# Patient Record
Sex: Female | Born: 1970
Health system: Southern US, Community
[De-identification: ages and names within clinical notes are randomized; demographics above are authoritative.]

## PROBLEM LIST (undated history)

## (undated) DIAGNOSIS — R51 Headache: Secondary | ICD-10-CM

## (undated) DIAGNOSIS — K589 Irritable bowel syndrome without diarrhea: Secondary | ICD-10-CM

## (undated) DIAGNOSIS — K605 Anorectal fistula, unspecified: Secondary | ICD-10-CM

## (undated) DIAGNOSIS — Z8679 Personal history of other diseases of the circulatory system: Secondary | ICD-10-CM

## (undated) DIAGNOSIS — Z9889 Other specified postprocedural states: Secondary | ICD-10-CM

## (undated) DIAGNOSIS — G8929 Other chronic pain: Secondary | ICD-10-CM

## (undated) DIAGNOSIS — R519 Headache, unspecified: Secondary | ICD-10-CM

## (undated) DIAGNOSIS — Z973 Presence of spectacles and contact lenses: Secondary | ICD-10-CM

## (undated) DIAGNOSIS — M1990 Unspecified osteoarthritis, unspecified site: Secondary | ICD-10-CM

## (undated) DIAGNOSIS — F419 Anxiety disorder, unspecified: Secondary | ICD-10-CM

## (undated) DIAGNOSIS — R112 Nausea with vomiting, unspecified: Secondary | ICD-10-CM

## (undated) DIAGNOSIS — I10 Essential (primary) hypertension: Secondary | ICD-10-CM

## (undated) HISTORY — DX: Unspecified osteoarthritis, unspecified site: M19.90

## (undated) HISTORY — DX: Irritable bowel syndrome, unspecified: K58.9

## (undated) HISTORY — DX: Headache, unspecified: R51.9

## (undated) HISTORY — DX: Headache: R51

## (undated) HISTORY — PX: REDUCTION MAMMAPLASTY: SUR839

## (undated) HISTORY — DX: Other chronic pain: G89.29

---

## 1994-01-06 HISTORY — PX: BREAST REDUCTION SURGERY: SHX8

## 2005-08-08 ENCOUNTER — Ambulatory Visit: Payer: Self-pay | Admitting: Family Medicine

## 2005-08-22 ENCOUNTER — Ambulatory Visit: Payer: Self-pay | Admitting: Family Medicine

## 2005-12-19 ENCOUNTER — Ambulatory Visit: Payer: Self-pay | Admitting: Family Medicine

## 2006-05-13 ENCOUNTER — Ambulatory Visit: Payer: Self-pay | Admitting: Obstetrics & Gynecology

## 2006-05-13 ENCOUNTER — Encounter: Payer: Self-pay | Admitting: Obstetrics & Gynecology

## 2007-04-27 ENCOUNTER — Ambulatory Visit: Payer: Self-pay | Admitting: Obstetrics & Gynecology

## 2007-04-29 ENCOUNTER — Ambulatory Visit (HOSPITAL_COMMUNITY): Admission: RE | Admit: 2007-04-29 | Discharge: 2007-04-29 | Payer: Self-pay | Admitting: Gynecology

## 2007-05-06 ENCOUNTER — Ambulatory Visit (HOSPITAL_COMMUNITY): Admission: RE | Admit: 2007-05-06 | Discharge: 2007-05-06 | Payer: Self-pay | Admitting: Gynecology

## 2007-08-27 ENCOUNTER — Ambulatory Visit: Payer: Self-pay | Admitting: Internal Medicine

## 2007-08-27 DIAGNOSIS — K589 Irritable bowel syndrome without diarrhea: Secondary | ICD-10-CM | POA: Insufficient documentation

## 2007-08-27 DIAGNOSIS — R1011 Right upper quadrant pain: Secondary | ICD-10-CM | POA: Insufficient documentation

## 2007-08-31 LAB — CONVERTED CEMR LAB: Tissue Transglutaminase Ab, IgA: 1 units (ref <7)

## 2007-11-02 ENCOUNTER — Ambulatory Visit: Payer: Self-pay | Admitting: Nurse Practitioner

## 2007-11-23 ENCOUNTER — Encounter: Payer: Self-pay | Admitting: Family Medicine

## 2007-11-30 ENCOUNTER — Ambulatory Visit: Payer: Self-pay | Admitting: Nurse Practitioner

## 2008-01-11 ENCOUNTER — Ambulatory Visit: Payer: Self-pay | Admitting: Family Medicine

## 2008-01-11 DIAGNOSIS — M25539 Pain in unspecified wrist: Secondary | ICD-10-CM | POA: Insufficient documentation

## 2008-01-26 ENCOUNTER — Ambulatory Visit: Payer: Self-pay | Admitting: Family Medicine

## 2008-01-26 DIAGNOSIS — M654 Radial styloid tenosynovitis [de Quervain]: Secondary | ICD-10-CM | POA: Insufficient documentation

## 2008-02-01 ENCOUNTER — Ambulatory Visit: Payer: Self-pay | Admitting: Nurse Practitioner

## 2008-02-08 ENCOUNTER — Telehealth (INDEPENDENT_AMBULATORY_CARE_PROVIDER_SITE_OTHER): Payer: Self-pay | Admitting: *Deleted

## 2008-02-08 ENCOUNTER — Encounter: Payer: Self-pay | Admitting: Family Medicine

## 2008-03-06 ENCOUNTER — Telehealth: Payer: Self-pay | Admitting: Family Medicine

## 2008-05-30 ENCOUNTER — Encounter: Payer: Self-pay | Admitting: Nurse Practitioner

## 2008-05-30 ENCOUNTER — Ambulatory Visit: Payer: Self-pay | Admitting: Nurse Practitioner

## 2008-10-24 ENCOUNTER — Ambulatory Visit: Payer: Self-pay | Admitting: Nurse Practitioner

## 2009-01-11 ENCOUNTER — Ambulatory Visit: Payer: Self-pay | Admitting: Family Medicine

## 2009-01-11 DIAGNOSIS — R079 Chest pain, unspecified: Secondary | ICD-10-CM | POA: Insufficient documentation

## 2009-01-11 DIAGNOSIS — I10 Essential (primary) hypertension: Secondary | ICD-10-CM | POA: Insufficient documentation

## 2009-01-12 LAB — CONVERTED CEMR LAB
AST: 21 units/L (ref 0–37)
Albumin: 3.5 g/dL (ref 3.5–5.2)
Alkaline Phosphatase: 53 units/L (ref 39–117)
Basophils Absolute: 0 10*3/uL (ref 0.0–0.1)
Basophils Relative: 0.1 % (ref 0.0–3.0)
CO2: 28 meq/L (ref 19–32)
Calcium: 9.2 mg/dL (ref 8.4–10.5)
Eosinophils Absolute: 0 10*3/uL (ref 0.0–0.7)
Glucose, Bld: 86 mg/dL (ref 70–99)
HCT: 44 % (ref 36.0–46.0)
Hemoglobin: 14.3 g/dL (ref 12.0–15.0)
Lymphocytes Relative: 36.2 % (ref 12.0–46.0)
Lymphs Abs: 2.4 10*3/uL (ref 0.7–4.0)
MCHC: 32.4 g/dL (ref 30.0–36.0)
Monocytes Relative: 6.1 % (ref 3.0–12.0)
Neutro Abs: 3.9 10*3/uL (ref 1.4–7.7)
Potassium: 3.6 meq/L (ref 3.5–5.1)
RBC: 4.42 M/uL (ref 3.87–5.11)
RDW: 12.3 % (ref 11.5–14.6)
Sodium: 139 meq/L (ref 135–145)
TSH: 1.1 microintl units/mL (ref 0.35–5.50)
Total Protein: 8 g/dL (ref 6.0–8.3)

## 2009-01-23 ENCOUNTER — Ambulatory Visit: Payer: Self-pay | Admitting: Family Medicine

## 2009-11-20 ENCOUNTER — Ambulatory Visit: Payer: Self-pay | Admitting: Nurse Practitioner

## 2009-11-26 ENCOUNTER — Ambulatory Visit: Payer: Self-pay | Admitting: Internal Medicine

## 2009-11-26 DIAGNOSIS — M79609 Pain in unspecified limb: Secondary | ICD-10-CM | POA: Insufficient documentation

## 2009-12-03 ENCOUNTER — Ambulatory Visit: Payer: Self-pay | Admitting: Internal Medicine

## 2010-01-27 ENCOUNTER — Encounter: Payer: Self-pay | Admitting: Gynecology

## 2010-02-05 NOTE — Letter (Signed)
Summary: Chronic Disease Risk Summary Report/Know Your Number  Chronic Disease Risk Summary Report/Know Your Number   Imported By: Lanelle Bal 01/30/2009 12:57:33  _____________________________________________________________________  External Attachment:    Type:   Image     Comment:   External Document

## 2010-02-05 NOTE — Assessment & Plan Note (Signed)
Summary: LEFT HAND PAIN/CLE   Vital Signs:  Patient profile:   40 year old female Height:      60 inches Weight:      197.25 pounds BMI:     38.66 Temp:     98.9 degrees F oral Pulse rate:   76 / minute Pulse rhythm:   regular BP sitting:   112 / 80  (right arm) Cuff size:   large  Vitals Entered By: Lewanda Rife LPN (November 26, 2009 12:17 PM) CC: Lt hand pain On 11/25/09 lt hand was shut in car door.   History of Present Illness: CC: L hand injury  DOI: 11/25/2009  L hand crush injury - closed door on hand yesterday afternoon.  Immediately started using ice and ibuprofen.  This am hurting forearm as well.  Mainly 2nd and 3rd MC heads.  Mild swelling, mild bruising.  No bleeding.  No previous L hand injuries before.  Caught R thumb in fireplace last year.  Allergies: 1)  ! 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  Alcohol Use - yes- rare Daily Caffeine Use-1 Illicit Drug Use - no Patient does not get regular exercise.  Married with one son  Review of Systems       per HPI  Physical Exam  General:  overweight appearing female in NAD Msk:  L dorsal hand mild swelling, point tenderness along head of 2nd MCP joint.  No anatomical snuff box tenderness.    able to flex PIP/DIP of 2nd and 3rd fingers in isolation against resistance. Pulses:  2+ rad pulses Extremities:  No clubbing, cyanosis, edema or deformities noted. Neurologic:  sensation intact.  diminished grip strength L hand 2/2 pain.  unable to touch thumb/2nd finger to test strength. Skin:  slight bruising over 2nd and 3rd MCP joints   Impression & Recommendations:  Problem # 1:  HAND PAIN, LEFT (ICD-729.5) Xrays L hand - negative for fracture on my read.  likely bony contusion vs ligament sprain.  rec RTC 1 wk for  f/u with Copland or myself.  no tendon injury evident on exam today.  advised could use hand splint in neutral position, provided with script for same.  Orders: T-Hand Left 3 Views (73130TC)  Complete Medication List: 1)  Multivitamins Tabs (Multiple vitamin) .... Once daily 2)  Metoprolol Succinate 25 Mg Xr24h-tab (Metoprolol succinate) .Marland Kitchen.. 1 tab by mouth daily 3)  Topamax 50 Mg Tabs (Topiramate) .... Take one and a half at bedtime to total 75 mg 4)  Treximet 85-500 Mg Tabs (Sumatriptan-naproxen sodium) .... Take one tablet as needed migraine 5)  Losartan Potassium-hctz 100-25 Mg Tabs (Losartan potassium-hctz) .Marland Kitchen.. 1 tab by mouth daily 6)  Lo Loestrin ?mg  .... One tablet by mouth daily 7)  Effexor Xr 75 Mg Xr24h-cap (Venlafaxine hcl) .... Take 1 capsule by mouth once a day 8)  L Hand Brace/splint  .... To keep 2nd/3rd mcp joint  ~70-90 degrees (neutral) icd:729.5  Patient Instructions: 1)  Return in 1 wk for follow up. 2)  Xrays look good today.  I think you have bony contusion. 3)  Use ice and ibuprofen (regularly for next few days then as needed). 4)  Good to meet you today, call clinic with questions. 5)  If not getting better as expected, call us. Prescriptions: L HAND BRACE/SPLINT to keep 2nd/3rd MCP joint  ~  70-90 degrees (neutral) ICD:729.5  #1 x 0   Entered and Authorized by:   Eustaquio Boyden  MD   Signed by:   Eustaquio Boyden  MD on 11/26/2009   Method used:   Print then Give to Patient   RxID:   [id number]    Orders Added: 1)  T-Hand Left 3 Views [73130TC] 2)  Est. Patient Level III [14782]    Current Allergies (reviewed today): ! PERCOCET  Appended Document: LEFT HAND PAIN/CLE FROM at wrists bilaterally passively and against resistance

## 2010-02-05 NOTE — Assessment & Plan Note (Signed)
Summary: ONE WEEK FOLLOW UP / LFW   Vital Signs:  Patient profile:   40 year old female Height:      60 inches Weight:      199.75 pounds BMI:     39.15 Temp:     98.6 degrees F oral Pulse rate:   74 / minute Pulse rhythm:   regular BP sitting:   110 / 72  (left arm) Cuff size:   large  Vitals Entered ByMelody Comas (December 03, 2009 1:59 PM)  History of Present Illness: CC: f/u hand  DOI: 11/25/2009  L hand crush injury - closed door on hand.  Immediately started using ice and ibuprofen.  Mainly 2nd and 3rd MC heads.  Mild swelling, mild bruising.  No bleeding.  No previous L hand injuries before.    Xrays last week WNL, no fracture.  advised to rest, compression ace wrap during work, ice at home.  doing better, regaining function, able to grasp things at work.    Allergies: 1)  ! Percocet  Review of Systems       per HPI  Physical Exam  General:  overweight appearing female in NAD Msk:  L dorsal hand mild swelling, some tenderness along head of 2nd MCP joint.   No anatomical snuff box tenderness.    able to flex PIP of 2nd and 3rd fingers in isolation against resistance. Pulses:  2+ rad pulses, brisk cap refill Neurologic:  sensation intact.  improved grip strength L hand.  able to touch thumb/2nd finger to test strength, good strength   Impression & Recommendations:  Problem # 1:  HAND PAIN, LEFT (ICD-729.5) bony contusion vs ligament sprain.  continue NSAIDs as needed, no script provided.  return if worsening, o/w should resolve with time.  Complete Medication List: 1)  Multivitamins Tabs (Multiple vitamin) .... Once daily 2)  Metoprolol Succinate 25 Mg Xr24h-tab (Metoprolol succinate) .Marland Kitchen.. 1 tab by mouth daily 3)  Topamax 50 Mg Tabs (Topiramate) .... Take one and a half at bedtime to total 75 mg 4)  Treximet 85-500 Mg Tabs (Sumatriptan-naproxen sodium) .... Take one tablet as needed migraine 5)  Losartan Potassium-hctz 100-25 Mg Tabs (Losartan  potassium-hctz) .Marland Kitchen.. 1 tab by mouth daily 6)  Lo Loestrin ?mg  .... One tablet by mouth daily 7)  Effexor Xr 75 Mg Xr24h-cap (Venlafaxine hcl) .... Take 1 capsule by mouth once a day 8)  L Hand Brace/splint  .... To keep 2nd/3rd mcp joint  ~70-90 degrees (neutral) icd:729.5  Patient Instructions: 1)  Good to see you today. 2)  Continue anti inflammatory as well as voltaren gel. 3)  Keep wrapping for next few weeks to help. 4)  Return if not improving as expected. 5)  Call clinic with uqestions.   Orders Added: 1)  Est. Patient Level II [[address]]    Current Allergies (reviewed today): ! PERCOCET

## 2010-02-05 NOTE — Assessment & Plan Note (Signed)
Summary: ROA   Vital Signs:  Patient profile:   40 year old female Height:      60 inches Weight:      188.2 pounds BMI:     36.89 Temp:     98.1 degrees F oral Pulse rate:   78 / minute Pulse rhythm:   regular BP sitting:   124 / 78  (left arm) Cuff size:   large  Vitals Entered By: Benny Lennert CMA Duncan Dull) (January 23, 2009 12:36 PM)  History of Present Illness: Chief complaint 2 wk follow up bp  Chest pain resolved...more energy on lower dose of metoprolol.   Hypertension History:      She denies chest pain, palpitations, dyspnea with exertion, syncope, and side effects from treatment.  BP well controlled at home...higher at night 140/100 Has noted side effect of dry cough.Kimberly Kitchenocc keeps up at night Feeling better. Kimberly [name]        Positive major cardiovascular risk factors include hypertension.  Negative major cardiovascular risk factors include female age less than 72 years old and non-tobacco-user status.     Problems Prior to Update: 1)  Chest Pain, Acute  (ICD-786.50) 2)  Essential Hypertension  (ICD-401.9) 3)  De Quervain's Tenosynovitis  (ICD-727.04) 4)  Wrist Pain, Right  (ICD-719.43) 5)  Irritable Bowel Syndrome  (ICD-564.1) 6)  Abdominal Pain-ruq  (ICD-789.01)  Current Medications (verified): 1)  Multivitamins  Tabs (Multiple Vitamin) .... Once Daily 2)  Metoprolol Succinate 25 Mg Xr24h-Tab (Metoprolol Succinate) .Kimberly [name].. 1 Tab By Mouth Daily 3)  Topamax 50 Mg Tabs (Topiramate) .... Take One and A Half At Bedtime To Total 75 Mg 4)  Treximet 85-500 Mg Tabs (Sumatriptan-Naproxen Sodium) .... Take One Tablet As Needed Migraine 5)  Losartan Potassium-Hctz 100-25 Mg Tabs (Losartan Potassium-Hctz) .Kimberly [name].. 1 Tab By Mouth Daily 6)  Nuvaring 0.12-0.015 Mg/24hr Ring (Etonogestrel-Ethinyl Estradiol) .Kimberly [name].. 1 Inserted Every 3 Weeks As Directed  Allergies: 1)  ! Percocet  Past History:  Past medical, surgical, family and social histories (including risk factors) reviewed, and no  changes noted (except as noted below).  Past Medical History: Reviewed history from 08/27/2007 and no changes required. Chronic Headaches Hypertension Urinary Tract Infection  Past Surgical History: Reviewed history from 08/27/2007 and no changes required. C- Section 2001 Breast reduction  Family History: Reviewed history from 08/27/2007 and no changes required. Family History of Ovarian Cancer:paternal Grandmother Family History of Colitis/Crohn's:Father Family History of Diabetes: Mother, Father, Sister Family History of Irritable Bowel Syndrome:Brother, Sister No FH of Colon Cancer:  Social History: Reviewed history from 08/27/2007 and no changes required. 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  Alcohol Use - yes- rare Daily Caffeine Use-1 Illicit Drug Use - no Patient does not get regular exercise.  Married with one son  Review of Systems General:  Denies fatigue and fever. CV:  Denies chest pain or discomfort. Resp:  Denies shortness of breath. GI:  Denies abdominal pain. GU:  Denies dysuria.  Physical Exam  General:  overweight appearing female in NAD Mouth:  MMM Neck:  no carotid bruit or thyromegaly no cervical or supraclavicular lymphadenopathy  Lungs:  Normal respiratory effort, chest expands symmetrically. Lungs are clear to auscultation, no crackles or wheezes. Heart:  Normal rate and regular rhythm. S1 and S2 normal without gallop, murmur, click, rub or other extra sounds. Pulses:  R and L posterior tibial pulses are full and equal bilaterally  Extremities:  No clubbing, cyanosis, edema or deformities noted.   Impression & Recommendations:  Problem # 1:  ESSENTIAL HYPERTENSION (ICD-401.9) Improved control on ARB but not at goal at home..increase dose.  Energy better on lower dose of BBlocker. Her updated medication list for this problem includes:    Metoprolol Succinate 25 Mg Xr24h-tab (Metoprolol succinate) .Kimberly [name]... 1 tab by mouth  daily    Losartan Potassium-hctz 100-25 Mg Tabs (Losartan potassium-hctz) .Kimberly [name]... 1 tab by mouth daily  BP today: 124/78 Prior BP: 120/86 (01/11/2009)  Prior 10 Yr Risk Heart Disease: Not enough information (01/11/2009)  Labs Reviewed: K+: 3.6 (01/11/2009) Creat: : 0.7 (01/11/2009)     Problem # 2:  CHEST PAIN, ACUTE (ICD-786.50) Assessment: Improved Resolved.  Complete Medication List: 1)  Multivitamins Tabs (Multiple vitamin) .... Once daily 2)  Metoprolol Succinate 25 Mg Xr24h-tab (Metoprolol succinate) .Kimberly [name].. 1 tab by mouth daily 3)  Topamax 50 Mg Tabs (Topiramate) .... Take one and a half at bedtime to total 75 mg 4)  Treximet 85-500 Mg Tabs (Sumatriptan-naproxen sodium) .... Take one tablet as needed migraine 5)  Losartan Potassium-hctz 100-25 Mg Tabs (Losartan potassium-hctz) .Kimberly [name].. 1 tab by mouth daily 6)  Nuvaring 0.12-0.015 Mg/24hr Ring (Etonogestrel-ethinyl estradiol) .Kimberly [name].. 1 inserted every 3 weeks as directed  Hypertension Assessment/Plan:      The patient's hypertensive risk group is category A: No risk factors and no target organ damage.  Today's blood pressure is 124/78.  Her blood pressure goal is < 140/90.  Patient Instructions: 1)  Please schedule a follow-up appointment in 1 month, call if BP still over 140/90 on new medicaiton. Prescriptions: LOSARTAN POTASSIUM-HCTZ 100-25 MG TABS (LOSARTAN POTASSIUM-HCTZ) 1 tab by mouth daily  #30 x 0   Entered and Authorized by:   Kerby Nora MD   Signed by:   Kerby Nora MD on 01/23/2009   Method used:   Electronically to        [facility]* (retail)       [address]       [address] Shipping/mailing       [place], [place]  [address]       Ph: [phone]       Fax: [phone]   RxID:   [id number]   Current Allergies (reviewed today): ! PERCOCET

## 2010-02-05 NOTE — Assessment & Plan Note (Signed)
Summary: F/U BLOOD PRESSURE/CLE   Vital Signs:  Patient profile:   40 year old female Height:      60 inches Weight:      186.2 pounds BMI:     36.50 Temp:     98.1 degrees F oral Pulse rate:   78 / minute Pulse rhythm:   regular BP sitting:   120 / 86  (left arm) Cuff size:   large  Vitals Entered By: Benny Lennert CMA Duncan Dull) (January 11, 2009 11:57 AM)  History of Present Illness: Chief complaint followup blood pressure  HTN, had previously been well controlled on HCTZhydralazine and metoprolol.  Has noted elevated BPs in last 4-6 monhs Noted elevated in headhace study. 120/90s  In last month  increased metoprol to two times a day..no BP improvement but feels more tired.   Few episodes of chest pain...awaoke in middle on ngith, lasted several hours. Occured 3 weeks ago. Associated SOB, no sweating. No exertion CP with housework. Occ heartburn with spicy food.   Also mentions that sclera are blue since child...migraine MD noted as well.   Hypertension History:      She complains of chest pain and peripheral edema, but denies headache, palpitations, and neurologic problems.        Positive major cardiovascular risk factors include hypertension.  Negative major cardiovascular risk factors include female age less than 36 years old and non-tobacco-user status.     Problems Prior to Update: 1)  De Quervain's Tenosynovitis  (ICD-727.04) 2)  Wrist Pain, Right  (ICD-719.43) 3)  Irritable Bowel Syndrome  (ICD-564.1) 4)  Abdominal Pain-ruq  (ICD-789.01)  Current Medications (verified): 1)  Multivitamins  Tabs (Multiple Vitamin) .... Once Daily 2)  Metoprolol Succinate 25 Mg Xr24h-Tab (Metoprolol Succinate) .Marland Kitchen.. 1 Tab By Mouth Daily 3)  Topamax 50 Mg Tabs (Topiramate) .... Take One and A Half At Bedtime To Total 75 Mg 4)  Treximet 85-500 Mg Tabs (Sumatriptan-Naproxen Sodium) .... Take One Tablet As Needed Migraine 5)  Lisinopril-Hydrochlorothiazide 10-12.5 Mg Tabs  (Lisinopril-Hydrochlorothiazide) .Marland Kitchen.. 1 Tab By Mouth Daily  Allergies: 1)  ! Percocet  Past History:  Past medical, surgical, family and social histories (including risk factors) reviewed, and no changes noted (except as noted below).  Past Medical History: Reviewed history from 08/27/2007 and no changes required. Chronic Headaches Hypertension Urinary Tract Infection  Past Surgical History: Reviewed history from 08/27/2007 and no changes required. C- Section 2001 Breast reduction  Family History: Reviewed history from 08/27/2007 and no changes required. Family History of Ovarian Cancer:paternal Grandmother Family History of Colitis/Crohn's:Father Family History of Diabetes: Mother, Father, Sister Family History of Irritable Bowel Syndrome:Brother, Sister No FH of Colon Cancer:  Social History: Reviewed history from 08/27/2007 and no changes required. 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  Alcohol Use - yes- rare Daily Caffeine Use-1 Illicit Drug Use - no Patient does not get regular exercise.  Married with one son  Review of Systems General:  Denies fatigue. CV:  Complains of chest pain or discomfort. Resp:  Denies shortness of breath. GI:  Denies abdominal pain, bloody stools, constipation, and diarrhea. GU:  Denies dysuria.  Physical Exam  General:  overweight appearing female in NAd Mouth:  MMM Neck:  no carotid bruit or thyromegaly no cervical or supraclavicular lymphadenopathy  Chest Wall:  No deformities, masses, or tenderness noted. Lungs:  Normal respiratory effort, chest expands symmetrically. Lungs are clear to auscultation, no crackles or wheezes. Heart:  Normal rate and regular rhythm. S1  and S2 normal without gallop, murmur, click, rub or other extra sounds. Abdomen:  Bowel sounds positive,abdomen soft and non-tender without masses, organomegaly or hernias noted. Pulses:  R and L posterior tibial pulses are full and equal  bilaterally  Extremities:  No clubbing, cyanosis, edema or deformities noted.   Impression & Recommendations:  Problem # 1:  CHEST PAIN, ACUTE (ICD-786.50) EKG NSR, no ST changes, no LVH, no Q wave.  Liekly due to GERD vs anxiety.  Orders: EKG w/ Interpretation (93000)  Problem # 2:  ESSENTIAL HYPERTENSION (ICD-401.9) Worsening control...will eval for liver, kidney, hemetology or thyroid cause. Encouraged exercise, weight loss, healthy eating habits.  Low salt diet. Change BP medicaitons: lower metoprolol given SE of fatigue associated, cahnge hydralazine to ACEI.  Follow BPs a t home, records. Follow in 2 weeks fo BP check. Will need to check Cr at that time.  The following medications were removed from the medication list:    Hydralazine-hctz 25-25 Mg Caps (Hydralazine-hctz) ..... Once daily Her updated medication list for this problem includes:    Metoprolol Succinate 25 Mg Xr24h-tab (Metoprolol succinate) .Marland Kitchen... 1 tab by mouth daily    Lisinopril-hydrochlorothiazide 10-12.5 Mg Tabs (Lisinopril-hydrochlorothiazide) .Marland Kitchen... 1 tab by mouth daily  Orders: TLB-BMP (Basic Metabolic Panel-BMET) (80048-METABOL) TLB-CBC Platelet - w/Differential (85025-CBCD) TLB-Hepatic/Liver Function Pnl (80076-HEPATIC) TLB-TSH (Thyroid Stimulating Hormone) (84443-TSH)  Complete Medication List: 1)  Multivitamins Tabs (Multiple vitamin) .... Once daily 2)  Metoprolol Succinate 25 Mg Xr24h-tab (Metoprolol succinate) .Marland Kitchen.. 1 tab by mouth daily 3)  Topamax 50 Mg Tabs (Topiramate) .... Take one and a half at bedtime to total 75 mg 4)  Treximet 85-500 Mg Tabs (Sumatriptan-naproxen sodium) .... Take one tablet as needed migraine 5)  Lisinopril-hydrochlorothiazide 10-12.5 Mg Tabs (Lisinopril-hydrochlorothiazide) .Marland Kitchen.. 1 tab by mouth daily  Hypertension Assessment/Plan:      The patient's hypertensive risk group is category A: No risk factors and no target organ damage.  Today's blood pressure is 120/86.  Her  blood pressure goal is < 140/90.  Patient Instructions: 1)  Follow BPs at home, call if greater than 140/90. 2)  Follow up appt in 2 weeks, HTN. 3)  Stop hydralazine/HCTZ. 4)  Decrease metoprolol to LONG ACTING SUCCINATE form  at 25 mg daily. 5)  Add lisinopril HCTZ 10/12.5 mg daily  6)  Will check kidney function at that time.  Prescriptions: LISINOPRIL-HYDROCHLOROTHIAZIDE 10-12.5 MG TABS (LISINOPRIL-HYDROCHLOROTHIAZIDE) 1 tab by mouth daily  #30 x 3   Entered and Authorized by:   Kerby Nora MD   Signed by:   Kerby Nora MD on 01/11/2009   Method used:   Electronically to        [facility]* (retail)       [address]       [address] Shipping/mailing       [place], [place]  [address]       Ph: [phone]       Fax: [phone]   RxID:   [id number] METOPROLOL SUCCINATE 25 MG XR24H-TAB (METOPROLOL SUCCINATE) 1 tab by mouth daily  #30 x 3   Entered and Authorized by:   Kerby Nora MD   Signed by:   Kerby Nora MD on 01/11/2009   Method used:   Electronically to        [facility]* (retail)       [address]       [address] Shipping/mailing  [place], [place]  [address]       Ph: [phone]       Fax: [phone]   RxID:   [id number]   Current Allergies (reviewed today): ! PERCOCET

## 2010-03-18 ENCOUNTER — Encounter (INDEPENDENT_AMBULATORY_CARE_PROVIDER_SITE_OTHER): Payer: Self-pay | Admitting: *Deleted

## 2010-03-18 LAB — CONVERTED CEMR LAB
Rhuematoid fact SerPl-aCnc: <10 intl units/mL (ref <=14)
Sed Rate: 44 mm/hr — ABNORMAL HIGH (ref 0–22)

## 2010-03-19 ENCOUNTER — Encounter (INDEPENDENT_AMBULATORY_CARE_PROVIDER_SITE_OTHER): Payer: Self-pay | Admitting: *Deleted

## 2010-03-19 LAB — CONVERTED CEMR LAB
Alkaline Phosphatase: 84 units/L (ref 39–117)
BUN: 13 mg/dL (ref 6–23)
CO2: 22 meq/L (ref 19–32)
Creatinine, Ser: 0.75 mg/dL (ref 0.40–1.20)
Eosinophils Absolute: 0.1 10*3/uL (ref 0.0–0.7)
Eosinophils Relative: 1 % (ref 0–5)
Glucose, Bld: 118 mg/dL — ABNORMAL HIGH (ref 70–99)
HCT: 42.6 % (ref 36.0–46.0)
Hemoglobin: 14 g/dL (ref 12.0–15.0)
Lymphocytes Relative: 43 % (ref 12–46)
Lymphs Abs: 3.3 10*3/uL (ref 0.7–4.0)
MCV: 98.6 fL (ref 78.0–100.0)
Monocytes Absolute: 0.4 10*3/uL (ref 0.1–1.0)
Platelets: 312 10*3/uL (ref 150–400)
Total Bilirubin: 0.3 mg/dL (ref 0.3–1.2)
WBC: 7.7 10*3/uL (ref 4.0–10.5)

## 2010-03-27 ENCOUNTER — Encounter: Payer: Self-pay | Admitting: Family Medicine

## 2010-03-28 ENCOUNTER — Encounter: Payer: Self-pay | Admitting: Family Medicine

## 2010-03-28 ENCOUNTER — Ambulatory Visit (INDEPENDENT_AMBULATORY_CARE_PROVIDER_SITE_OTHER): Payer: 59 | Admitting: Family Medicine

## 2010-03-28 DIAGNOSIS — R109 Unspecified abdominal pain: Secondary | ICD-10-CM | POA: Insufficient documentation

## 2010-03-28 LAB — POCT URINALYSIS DIPSTICK
Glucose, UA: NEGATIVE
Ketones, UA: NEGATIVE
Spec Grav, UA: 1.005

## 2010-03-28 LAB — POCT URINE PREGNANCY: Preg Test, Ur: NEGATIVE

## 2010-03-28 NOTE — Patient Instructions (Signed)
I'm not quite sure what's causing your abdominal pain. We will start with blood work.  If abnormal, we will likely recommend CT scan to further evaluate. Urine culture sent today. If worsening pain or fevers or vomiting over weekend, please seek urgent medical care. Good to see you today, call clinic with questions

## 2010-03-28 NOTE — Progress Notes (Signed)
  Subjective:    Patient ID: Kimberly [name], female    DOB: [birth year], 40 y.o.   MRN: [id number]  HPI CC: lower abd pain  Works at [employer].  2d ago started having lower abd cramps.  Severe, caused her to bend over, happened at work.  Lasted 30-45 sec, went away.  Rest of afternoon had sore stomach.  That night pain woke her up - cramping.  Lasted about 1 hour.  Next day (yesterday) very tight/swollen abdomen.  Pain more localized to RLQ, but also spreads to entire lower abdomen.  Also feeling bloated, abd distension.  Only new food - bag of almonds recently.  O/w not associated with any time of day or specific food.  Done bedside US at work - ?collection of fluid uterus into cervix.  Ovaries looking fine?  Examined, really tender with sharp pains rectally.  Pain with pushing in, no rebound.  Upreg neg, UA with trace blood.  ? Colon issue.  rec f/u with PCP.  Previous discomfort similar to this due to large ovarian cyst.  LMP - OCP continuous so doesn't have periods.  Surgeries - breast reduction and c section 10 years ago.  PMHx, All, Meds, SurgHx reviewed.  Review of Systems No fevers/chills, appetite changes, weight changes, vomiting, constipation. No blood in stool or urine.  No BM changes. No dysuria, urgency, frequency, vag discharge, flank pain. No chest pain.  No cough, no rash. 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 Mild nausea today.  + diarrhea, baseline.    Objective:   Physical Exam  Constitutional: She is oriented to person, place, and time. She appears well-developed and well-nourished.  HENT:  Head: Normocephalic and atraumatic.  Nose: Nose normal.  Mouth/Throat: Oropharynx is clear and moist.  Eyes: Conjunctivae and EOM are normal. Pupils are equal, round, and reactive to light.  Neck: Normal range of motion. Neck supple.  Cardiovascular: Normal rate, regular rhythm, normal heart sounds and intact distal pulses.   Pulmonary/Chest: Effort  normal and breath sounds normal. No respiratory distress. She has no wheezes. She has no rales.  Abdominal: Soft. Bowel sounds are normal. She exhibits no distension and no mass. There is tenderness (throughout, but localizes to RLQ). There is no rebound and no guarding.  Lymphadenopathy:    She has no cervical adenopathy.  Neurological: She is alert and oriented to person, place, and time.  Skin: Skin is warm and dry. No rash noted.      Assessment & Plan:

## 2010-03-28 NOTE — Assessment & Plan Note (Signed)
2d hx of lower abd cramping, nontoxic, improving.  No red flags. Check blood work today, monitor. If abnormal, check imaging, would start with abd/pelvis US. If worsening, will need imaging, CT scan. Discussed red flags to seek care over weekend.  Will call with labwork results tomorrow. In h/o IBS per GI, ? Colonic spasm although no BM changes.

## 2010-03-29 LAB — COMPREHENSIVE METABOLIC PANEL
ALT: 17 U/L (ref 0–35)
Alkaline Phosphatase: 78 U/L (ref 39–117)
CO2: 31 mEq/L (ref 19–32)
Creatinine, Ser: 0.9 mg/dL (ref 0.4–1.2)
GFR: 78.45 mL/min (ref >60.00)
Sodium: 142 mEq/L (ref 135–145)
Total Bilirubin: 0.3 mg/dL (ref 0.3–1.2)
Total Protein: 7.2 g/dL (ref 6.0–8.3)

## 2010-03-29 LAB — CBC WITH DIFFERENTIAL/PLATELET
Eosinophils Relative: 0.3 % (ref 0.0–5.0)
HCT: 40.1 % (ref 36.0–46.0)
Hemoglobin: 13.9 g/dL (ref 12.0–15.0)
Lymphs Abs: 2.5 10*3/uL (ref 0.7–4.0)
MCV: 96.1 fl (ref 78.0–100.0)
Monocytes Absolute: 0.5 10*3/uL (ref 0.1–1.0)
Monocytes Relative: 5.6 % (ref 3.0–12.0)
Neutro Abs: 5.4 10*3/uL (ref 1.4–7.7)
RDW: 12.7 % (ref 11.5–14.6)

## 2010-03-29 NOTE — Progress Notes (Signed)
Patient notified. She said there hasn't been any change in pain. No worse, no better. It stills feels like it's cramping and she still feels bloated. I advised if any worse over the weekend to go to [facility] or ER. I told her we would call with UCx results when they came in and hopefully they will shed some light on things. She verbalized understanding.

## 2010-04-01 NOTE — Progress Notes (Signed)
Notified patient of insignificant growth on culture. Instructed her to call back with update on pain. Advised to push fluids and rest. Instructed her to call back with any questions or concerns.

## 2010-05-17 ENCOUNTER — Other Ambulatory Visit: Payer: Self-pay | Admitting: *Deleted

## 2010-05-17 MED ORDER — METOPROLOL SUCCINATE ER 25 MG PO TB24: 25.0000 mg | ORAL_TABLET | Freq: Every day | ORAL | Status: DC

## 2010-05-21 NOTE — Assessment & Plan Note (Signed)
Kimberly [name], PULVER                 ACCOUNT NO.:  [id number]   MEDICAL RECORD NO.:  [id number]          PATIENT TYPE:  POB   LOCATION:  [facility] at [facility]         FACILITY:  [facility]   PHYSICIAN:  [name], NP   DATE OF BIRTH:  [birth year]   DATE OF SERVICE:  11/20/2009                                  CLINIC NOTE   The patient comes to office today for followup on her headache.  The  patient feels that her headaches have been worse since August.  She  states that the issue seems to be that they are lasting more days.  She  did get up to and is maintaining a dose of 100 mg on her Topamax and 75  mg on her Effexor.  She has been taking occasionally Xanax, occasionally  Vicodin, and occasional Zanaflex as needed for her headaches along with  her Treximet.  As we are talking today, there are some family issues  that are unresolved at this time that need to be resolved.   PHYSICAL EXAMINATION:  VITAL SIGNS:  Blood pressure is 116/81, pulse is  87, weight 199, height is 5 feet.  GENERAL:  Well-developed, well-nourished, 40 year old Caucasian female  in no acute distress.  HEENT:  Head is normocephalic and atraumatic.  Pupils equal and  reactive.  NEUROLOGIC:  The patient is alert and oriented.  There is no excessive  anxiety or depression noted.  She has good muscle tone and sensation.   ASSESSMENT:  Migraine.   PLAN:  We will go up on her Topamax to 150.  She will take 50 mg in the  morning and 100 mg at night.  She is given a new prescription for that.  She is also encouraged to go to counseling to resolve these family  issues.  She will follow up on an as-needed basis.      [name], NP     LR/MEDQ  D:  11/20/2009  T:  11/21/2009  Job:  [id number]

## 2010-05-21 NOTE — Assessment & Plan Note (Signed)
Kimberly [name], Kimberly [name]                 ACCOUNT NO.:  [id number]   MEDICAL RECORD NO.:  [id number]          PATIENT TYPE:  POB   LOCATION:  [facility]         FACILITY:  [facility]   PHYSICIAN:  Johnella Moloney, MD        DATE OF BIRTH:  December 30, 1970   DATE OF SERVICE:  11/02/2007                                  CLINIC NOTE   The patient comes to the office today for migraine headache  consultation.  She is a well-known patient of this practice.  She does  state that her headaches started around the age of 14.  She definitely  has migraine without aura.  She rarely vomits.  Her headaches are  located in her right temple and her right occipital region.  She has  significant pain and feels like swelling in her right shoulder, in her  right neck.  Her headaches have been almost daily for the past 1 year.  She currently in a 30-day period has approximately 3 severe, 15  moderate, and 10-15 mild.  The patient is not overusing the medication.  She takes ibuprofen 800 mg approximately 3 times per week.  She  predominately uses rest and ice packs.  She does have Toprol-XL 50 mg  that she takes daily for hypertension.  If she feels that her blood  pressure is becoming elevated, she may take a second dose of that.  This  patient is married.  She has 1 child.  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  For birth control, she is using Mirena IUD.  She  has had a Mirena in the past and that has worked well for her.  She does  not have any menstrual cycle.  She denies any problems with sleep.  She  has no difficulty falling asleep or maintaining sleep.  She is currently  not exercising.  She does do some stretches.  She denies any depression,  but does admit to some anxiety.  She does not overuse caffeine.   PHYSICAL EXAMINATION:  GENERAL:  A well-developed, well-nourished 40-  year-old Caucasian female in no acute distress.  HEENT:  Head is normocephalic and atraumatic.  Pupils are equal  and  reactive to light.  CARDIAC:  Regular rate and rhythm.  LUNGS:  Clear bilaterally without rales, rhonchi, or wheezes.  NEUROLOGIC:  The patient is alert and oriented.  She has good speech  fluidity.  She is well coordinated.  She has good muscle strength.  MUSCULOSKELETAL:  The patient has very rigid muscles in her trapezius  region.  She definitely has tenderness over her right occipital nerve.   ASSESSMENT AND PLAN:  1. Chronic migraine.  2. Anxiety.  3. Hypertension.   PLAN:  We did have a lengthy discussion today.  We have definitely  realized that Devetta needs to have some headache prevention.  We will  start her on Topamax 25 mg 1 p.o. nightly for 1 week, 2 p.o. nightly,  and then up to 3 until she is seen again.  We will also do a Frova 2.5  mg 1 p.o. daily taper for the next 9 days.  Hopefully, this will help to  pull her out of this headache cycle.  We will also use trigger point  injections and occipital nerve blocks.  Please see that procedure note for her.  At her next visit next week, we  will give her Imitrex orally.  She is asked to ice her injection sites  tonight and rest.      [name], NP    ______________________________  Johnella Moloney, MD    LR/MEDQ  D:  11/02/2007  T:  11/03/2007  Job:  [id number]

## 2010-05-21 NOTE — Assessment & Plan Note (Signed)
Kimberly [name], Kimberly [name]                 ACCOUNT NO.:  [id number]   MEDICAL RECORD NO.:  [id number]          PATIENT TYPE:  POB   LOCATION:  [facility]         FACILITY:  [facility]   PHYSICIAN:  Elsie Lincoln, MD      DATE OF BIRTH:  Apr 09, 1970   DATE OF SERVICE:  05/30/2008                                  CLINIC NOTE   The patient comes to office today for her yearly Pap, pelvic, and breast  exam.  In addition, this patient would like to have her Mirena IUD out.  Her Mirena was placed in April 2009.  She has noticed that she has  developed cystic acne and has trialed multiple things trying to resolve  this on her own.  She has also tried taking birth control pills in  addition to the Mirena and that has not been helpful.  She has been on  [facility] for the last 2 months.  The acne has not resolved.  The  patient would like to continue on the [facility] for birth control.  The  patient has a known history of hypertension.  Her blood pressure at home  last week was 109/76.  She is currently on Toprol 50 mg b.i.d. as well  as hydrochlorothiazide 25 mg daily.  She is doing well with her weight  loss.  She has currently lost a total of 13 pounds.  She is doing well  with her migraine headache.  She remains on Topamax 75 mg.  She takes  Treximet when she gets a headache and overall has done quite well with  those.  She is not having any other problems at this time.   PHYSICAL EXAMINATION:  VITAL SIGNS:  Blood pressure is 115/82, weight is  182, height is 5 feet, and pulse is 72.  GENERAL:  This is a well-developed, well-nourished 40 year old Caucasian  female in no acute distress.  HEENT:  Head is normocephalic and atraumatic.  NECK:  Thyroid is not enlarged.  There is no lumps noted.  BREASTS:  The patient's breasts are symmetrical.  She does have scars  from reduction.  There were no masses noted.  There is no nipple  discharge.  There is no axillary lymphadenopathy.  CARDIAC:   Regular rate and rhythm.  LUNGS:  Clear bilaterally.  ABDOMEN:  Soft without any organomegaly.  GENITALIA:  Externally, there are no lesions or discharges.  Internally,  there is no odor or vaginal discharge.  Cervical os, IUD strings are  present.  The IUD was easily removed.  Cervix is somewhat friable.  Bimanual exam, no cervical motion tenderness.  EXTREMITIES:  Warm, dry with positive pulses.   ASSESSMENT:  1. Yearly Pap, pelvic, and breast exam.  2. Cystic acne.  We did again removed the Mirena IUD.  3. Hypertension.  The patient is asking for refill on her Toprol 50 mg      1 p.o. b.i.d. #80 with 4 refills and hydrochlorothiazide 25 mg 1      p.o. daily #90 with 4 refills.  4. Migraines.  The patient will continue on her Topamax as well as her      Imitrex.  She does not need refills at this time.  5. Weight loss.  The patient is encouraged to continue with her weight      loss program.  She is doing a great job with that.  She will return      to the clinic in 1 year or sooner as needed.      [name], NP    ______________________________  Elsie Lincoln, MD    LR/MEDQ  D:  05/30/2008  T:  05/31/2008  Job:  [id number]

## 2010-05-21 NOTE — Assessment & Plan Note (Signed)
[name], [name]                 ACCOUNT NO.:  [id number]   MEDICAL RECORD NO.:  [id number]          PATIENT TYPE:  POB   LOCATION:  [facility]         FACILITY:  [facility]   PHYSICIAN:  [name], NP   DATE OF BIRTH:  1970/11/25   DATE OF SERVICE:  02/01/2008                                  CLINIC NOTE   The patient comes to the office today for follow up on her migraine  headaches.  The patient is doing significantly better with her headaches  in approximately 30 days.  She has had one severe perhaps 3-5 moderates  and no mild.  She feels that she has regained control of the situation.  She is currently taking Topamax at 75 mg daily.  She is agreeable to  going up to 100 mg.  She did feel that the Frova and the trigger point  injections were very well to help break her headache cycle.  She is  quite pleased with the Treximet as that works well.  She in addition has  lost 12 pounds since October and she is quite pleased with that.   PHYSICAL EXAMINATION:  GENERAL:  Well-developed, well-nourished 40-year-  old Caucasian female, in no acute distress.  VITAL SIGNS:  Blood pressure 122/86.  CARDIAC:  Regular rate and rhythm.  No murmurs, rubs, or thrills.  LUNGS:  Clear bilaterally without rales, rhonchi, or wheezes.   ASSESSMENT:  Migraine headache.   PLAN:  We will go up to 100 mg on the Topamax.  The patient will  continue with the other modalities instituted including Treximet.  She  will follow up in 6 months or p.r.n.      [name], NP     LR/MEDQ  D:  02/01/2008  T:  02/02/2008  Job:  [id number]
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
## 2010-07-30 ENCOUNTER — Telehealth: Payer: Self-pay | Admitting: *Deleted

## 2010-07-30 NOTE — Telephone Encounter (Signed)
Tried to call in naprosyn via escribe to check and see if Kimberly [name] was fixed to escribe.  She was not and a ticket was called in to have this fixed.  No med was called in.

## 2010-08-01 ENCOUNTER — Other Ambulatory Visit: Payer: Self-pay | Admitting: Obstetrics & Gynecology

## 2010-08-01 DIAGNOSIS — R102 Pelvic and perineal pain: Secondary | ICD-10-CM

## 2010-08-02 ENCOUNTER — Ambulatory Visit (HOSPITAL_COMMUNITY)
Admission: RE | Admit: 2010-08-02 | Discharge: 2010-08-02 | Disposition: A | Discharge status: Home / Self Care | Payer: 59 | Source: Ambulatory Visit | Attending: Obstetrics & Gynecology | Admitting: Obstetrics & Gynecology

## 2010-08-02 ENCOUNTER — Telehealth: Payer: Self-pay | Admitting: *Deleted

## 2010-08-02 DIAGNOSIS — R102 Pelvic and perineal pain: Secondary | ICD-10-CM

## 2010-08-02 DIAGNOSIS — N949 Unspecified condition associated with female genital organs and menstrual cycle: Secondary | ICD-10-CM | POA: Insufficient documentation

## 2010-08-02 NOTE — Telephone Encounter (Signed)
Needed to reprint order as it was not showing at [facility] hospital.  Regarding ultrasound for continued pelvic pain over one week.

## 2010-08-05 ENCOUNTER — Encounter: Payer: Self-pay | Admitting: Internal Medicine

## 2010-08-05 ENCOUNTER — Telehealth: Payer: Self-pay | Admitting: Internal Medicine

## 2010-08-05 NOTE — Telephone Encounter (Signed)
Dr. Leone Payor will you approve seeing this pt? Please advise.

## 2010-08-05 NOTE — Telephone Encounter (Signed)
Ok - will see but next available

## 2010-08-05 NOTE — Telephone Encounter (Signed)
Pt would like to switch to Dr. Leone Payor from Dr. Marina Goodell. Her referring physician suggested Leone Payor to begin with but she needed to be seen so she took the appt with Dr. Marina Goodell. Dr. Marina Goodell will you approve the switch? Please advise.

## 2010-08-05 NOTE — Telephone Encounter (Signed)
Seen once in 2009. Okay with me if okay with Dr. Reece Agar.

## 2010-08-06 NOTE — Telephone Encounter (Signed)
Pt scheduled to see Dr. Leone Payor 09/13/10@1 :45pm. Pt aware.

## 2010-09-13 ENCOUNTER — Ambulatory Visit (INDEPENDENT_AMBULATORY_CARE_PROVIDER_SITE_OTHER): Payer: 59 | Admitting: Internal Medicine

## 2010-09-13 ENCOUNTER — Encounter: Payer: Self-pay | Admitting: Internal Medicine

## 2010-09-13 DIAGNOSIS — K589 Irritable bowel syndrome without diarrhea: Secondary | ICD-10-CM

## 2010-09-13 DIAGNOSIS — R768 Other specified abnormal immunological findings in serum: Secondary | ICD-10-CM | POA: Insufficient documentation

## 2010-09-13 DIAGNOSIS — R7689 Other specified abnormal immunological findings in serum: Secondary | ICD-10-CM | POA: Insufficient documentation

## 2010-09-13 DIAGNOSIS — K529 Noninfective gastroenteritis and colitis, unspecified: Secondary | ICD-10-CM

## 2010-09-13 DIAGNOSIS — R197 Diarrhea, unspecified: Secondary | ICD-10-CM

## 2010-09-13 DIAGNOSIS — M2550 Pain in unspecified joint: Secondary | ICD-10-CM

## 2010-09-13 DIAGNOSIS — G43909 Migraine, unspecified, not intractable, without status migrainosus: Secondary | ICD-10-CM

## 2010-09-13 DIAGNOSIS — G43009 Migraine without aura, not intractable, without status migrainosus: Secondary | ICD-10-CM | POA: Insufficient documentation

## 2010-09-13 MED ORDER — PEG-KCL-NACL-NASULF-NA ASC-C 100 G PO SOLR: 1.0000 | Freq: Once | ORAL | Status: DC

## 2010-09-13 NOTE — Assessment & Plan Note (Addendum)
Ventral diagnosis at this point include diabetes, inflammatory bowel disease. She could have ulcerative colitis or Crohn's or more likely if IBD, microscopic colitis I think. He did get some benefit from prednisone therapy for her arthralgias. Colonoscopy with terminal ileal intubation and random biopsies as appropriate. I discussed the risks benefits and indications should she understands and agrees to proceed. In the meantime I have recommended she try regular loperamide to see if that helps her symptoms. 1 or 2 a day. Depending upon what we see, IBD therapy versus Lotronex or other anti-spasmodic therapy for IBS diarrhea predominant will be indicated. The autoimmune issues or potential, and arthritis do seem to increase the chance of IBD but await the colonoscopy.

## 2010-09-13 NOTE — Patient Instructions (Addendum)
You have been scheduled for a Colonoscopy with separate instructions given. Your prep kit has been sent to your pharmacy for you to pick up. It is ok to take 1-2 Imodium every day to see if that helps your diarrhea and even up to 4 a day.

## 2010-09-13 NOTE — Assessment & Plan Note (Signed)
Certainly seems possible. She's had some chronic recurrent abdominal pain over the years. Await colonoscopy.

## 2010-09-13 NOTE — Progress Notes (Signed)
  Subjective:    Patient ID: Kimberly [name], female    DOB: 1970-01-09, 40 y.o.   MRN: [id number]  HPI Chronic loose and frequent defecation. Over the past 2 years it has gradualy worsened and she has extreme urgency and has had fecal incontinence. This is associated with intense pre-defecatory cramps. Salads are a particular problem but in genera, every time she eats she has to defecate. Has had arthralgias and joint stiffness, saw rheumatology and had + ANA. No clear dx.Acute inflammatory phase reactants were +. Empiric prednisone did help and helped diarrhea.  Takes naproxen at least 1 x a day now with benefit.  Review of Systems Arthritis/algia, back pain, fatigue, headaches, myalgia, pedal edema + All otherrs negative    Objective:   Physical Exam General: Well-developed, well-nourished and in no acute distress - obese Vitals: Reviewed and listed above Eyes:anicteric. Mouth and posterior pharynx: normal.  Neck: supple w/o thyromegaly or mass.  Lungs: clear. Heart: S1S2, no rubs, murmurs, gallops. Abdomen: soft, non-tender, no hepatosplenomegaly, hernia, or mass and BS+. Navel stud. Rectal:  deferred Lymphatics: no cervical, [place] or inguinal nodes. Extremities:  no edema Skin no rash.+ tattoos Neuro: nonfocal. A&O x 3.  Psych: appropriate mood and  affect.        Assessment & Plan:

## 2010-09-16 ENCOUNTER — Ambulatory Visit: Payer: 59 | Admitting: Internal Medicine

## 2010-10-18 ENCOUNTER — Encounter: Payer: Self-pay | Admitting: Internal Medicine

## 2010-10-18 ENCOUNTER — Ambulatory Visit (AMBULATORY_SURGERY_CENTER): Payer: 59 | Admitting: Internal Medicine

## 2010-10-18 VITALS — BP 155/53 | HR 78 | Temp 98.8°F | Resp 20 | Ht 60.0 in | Wt 199.0 lb

## 2010-10-18 DIAGNOSIS — R197 Diarrhea, unspecified: Secondary | ICD-10-CM

## 2010-10-18 HISTORY — PX: COLONOSCOPY: SHX174

## 2010-10-18 MED ORDER — SODIUM CHLORIDE 0.9 % IV SOLN: 500.0000 mL | INTRAVENOUS | Status: DC

## 2010-10-18 NOTE — Patient Instructions (Addendum)
The colonoscopy looked normal. Once the biopsies return (will tell us if there is a microscopic colitis) then we will call you. Should be in 1-2 weeks. Iva Boop, MD, [facility]  Discharge instructions per blue and green sheets Pathology pending and we will mail you a letter in 1-2 weeks with these results and the doctors recommendations.

## 2010-10-21 ENCOUNTER — Telehealth: Payer: Self-pay | Admitting: *Deleted

## 2010-10-21 NOTE — Telephone Encounter (Signed)
No id on machine, no message left  

## 2010-10-23 ENCOUNTER — Other Ambulatory Visit: Payer: Self-pay

## 2010-10-23 MED ORDER — DICYCLOMINE HCL 20 MG PO TABS: ORAL_TABLET | ORAL | Status: DC

## 2010-11-25 ENCOUNTER — Ambulatory Visit: Payer: 59 | Admitting: Internal Medicine

## 2010-12-23 ENCOUNTER — Encounter: Payer: Self-pay | Admitting: Internal Medicine

## 2010-12-23 ENCOUNTER — Ambulatory Visit (INDEPENDENT_AMBULATORY_CARE_PROVIDER_SITE_OTHER): Payer: 59 | Admitting: Internal Medicine

## 2010-12-23 VITALS — BP 112/64 | HR 80 | Ht 60.0 in | Wt 200.0 lb

## 2010-12-23 DIAGNOSIS — K529 Noninfective gastroenteritis and colitis, unspecified: Secondary | ICD-10-CM

## 2010-12-23 DIAGNOSIS — K589 Irritable bowel syndrome without diarrhea: Secondary | ICD-10-CM

## 2010-12-23 DIAGNOSIS — R197 Diarrhea, unspecified: Secondary | ICD-10-CM

## 2010-12-23 MED ORDER — DICYCLOMINE HCL 20 MG PO TABS: ORAL_TABLET | ORAL | Status: DC

## 2010-12-23 NOTE — Patient Instructions (Signed)
Continue with your current medications and diet. Dicyclomine has been changed to as needed. Follow up to see Dr. Leone Payor as needed.

## 2010-12-23 NOTE — Assessment & Plan Note (Signed)
Really improved on Naprosyn, suspect some side effect benefit and intermittent dicyclomine as well as dietary modification. She is avoiding roughage and certain meats such as steak and higher fat foods that seem to trigger problems. I believe she has IBS

## 2010-12-23 NOTE — Progress Notes (Signed)
Patient ID: Kimberly [name], female   DOB: Dec 19, 1970, 40 y.o.   MRN: [id number]  Patient returns for followup of IBS. She has chronic diarrhea and IBS problems. Recent colonoscopy was normal including biopsies. She was started on dicyclomine 20 mg. She is using that as needed with good results, usually before she goes out to eat. Caffeine remains about one cup of coffee a day. Overall she feels like her quality of life is good. She has noted less bowel movement activity on Naprosyn taken for joint problems. Her son is home from [place] on leave and that has relieved some of her situational stressors.  Allergies  Allergen Reactions  . Oxycodone-Acetaminophen     REACTION: Itching   Outpatient Prescriptions Prior to Visit  Medication Sig Dispense Refill  . losartan-hydrochlorothiazide (HYZAAR) 100-25 MG per tablet Take 1 tablet by mouth daily.        . metoprolol succinate (TOPROL-XL) 25 MG 24 hr tablet Take 1 tablet (25 mg total) by mouth daily.  90 tablet  3  . Multiple Vitamin (MULTIVITAMIN) tablet Take 1 tablet by mouth daily.        . naproxen (NAPROSYN) 500 MG tablet Take 500 mg by mouth 2 (two) times daily with a meal.        . Norethin-Eth Estrad-Fe Biphas (LO LOESTRIN FE) 1 MG-10 MCG / 10 MCG TABS Take 1 tablet by mouth daily.        Marland Kitchen dicyclomine (BENTYL) 20 MG tablet 3 times daily ac meals  90 tablet  2  . SUMAtriptan-naproxen (TREXIMET) 85-500 MG per tablet Take 1 tablet by mouth every 2 (two) hours as needed.         Past Medical History  Diagnosis Date  . Chronic headaches   . HTN (hypertension)   . Hx: UTI (urinary tract infection)   . Radial styloid tenosynovitis   . Irritable bowel syndrome    Past Surgical History  Procedure Date  . Cesarean section 2001  . Breast reduction surgery   . Colonoscopy 10/18/10    normal, consistent with IBS   History   Social History  . Marital Status: Married    Spouse Name: N/A    Number of Children: 1  . Years of Education: N/A     Occupational History  . medical assistant    Social History Main Topics  . Smoking status: Never Smoker   . Smokeless tobacco: Never Used  . Alcohol Use: Yes     Rare  . Drug Use: No  . Sexually Active: None   Other Topics Concern  . None   Social History Narrative  . None   Family History  Problem Relation Age of Onset  . Ovarian cancer Paternal Grandmother   . Colitis Father   . Crohn's disease Father   . Diabetes Father   . Diverticulitis Father   . Diabetes Mother   . Diabetes Sister   . Irritable bowel syndrome Sister   . Irritable bowel syndrome Brother   . Colon cancer Paternal Virgilio Frees Vitals:   12/23/10 0835  BP: 112/64  Pulse: 80   Assessment and plan:  See problem oriented charting below as well. 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 Quality of life is reported to be good at this time. She will continue with current medication and dietary measures. I will see her back as needed. 15 minutes time spent mostly in counseling.

## 2010-12-23 NOTE — Assessment & Plan Note (Signed)
This is significantly improved with dietary modification, awaiting significant roughage, avoiding fatty foods and meats, and dicyclomine intermittently. She notes that that is a good prophylactic medication prior to eating out. She will continue this. She also notes less bowel movements on Naprosyn. We discussed the nature of IBS. I don't think she has any inflammatory bowel disease though that still remains a possibility it seems unlikely I think all her elevated inflammatory markers and autoimmune abnormalities with a positive ANA her more likely related to joint problems though I explained that time will tell overall. I do not think any further investigation with respect to the GI tract as needed. I can see her back as needed. We can refill her dicyclomine and if needed though I told her her primary care physician should be able to do that for her.

## 2011-02-26 ENCOUNTER — Encounter: Payer: Self-pay | Admitting: Family Medicine

## 2011-02-26 ENCOUNTER — Ambulatory Visit (INDEPENDENT_AMBULATORY_CARE_PROVIDER_SITE_OTHER): Payer: 59 | Admitting: Family Medicine

## 2011-02-26 VITALS — BP 118/82 | HR 76 | Temp 98.6°F | Wt 201.5 lb

## 2011-02-26 DIAGNOSIS — J029 Acute pharyngitis, unspecified: Secondary | ICD-10-CM | POA: Insufficient documentation

## 2011-02-26 LAB — POCT RAPID STREP A (OFFICE): Rapid Strep A Screen: NEGATIVE

## 2011-02-26 NOTE — Assessment & Plan Note (Addendum)
Of 1 d duration.  RST negative. Anticipate viral laryngopharyngitis. No red flags. Monitor closely given recent abx use. Supportive care discussed as per instructions

## 2011-02-26 NOTE — Progress Notes (Signed)
  Subjective:    Patient ID: Kimberly [name], female    DOB: August 15, 1970, 41 y.o.   MRN: [id number]  HPI CC: ST  1d h/o ST.  This morning significant pain on right side, voice gone.  Has been coughing some last 3 days.  + thick yellow mucous in throat.  + PNDrainage.  So far took nyquil last night.  No fevers/chills, abd pain, n/v, ear or tooth pain.  No headaches.  No trouble opening/closing mouth.  + sick contacts at work.  Husband smokes outside pt doesn't smoke.  No h/o asthma, COPD.  Saw rheumatologist, checking into possible dx RA.  Recent illnesses:  12/2010 - cold sxs.  01/2011 - flu.  02/2011 2 wks ago strep? Called in zpack and got better.    Review of Systems Per HPI    Objective:   Physical Exam  Vitals reviewed. Constitutional: She appears well-developed and well-nourished. No distress.  HENT:  Head: Normocephalic and atraumatic.  Right Ear: Hearing, tympanic membrane, external ear and ear canal normal.  Left Ear: Hearing, tympanic membrane, external ear and ear canal normal.  Nose: No mucosal edema or rhinorrhea. Right sinus exhibits no maxillary sinus tenderness and no frontal sinus tenderness. Left sinus exhibits no maxillary sinus tenderness and no frontal sinus tenderness.  Mouth/Throat: Uvula is midline and mucous membranes are normal. Posterior oropharyngeal edema and posterior oropharyngeal erythema present. No oropharyngeal exudate or tonsillar abscesses.       Mildly swollen tonsils but no exudates  Eyes: Conjunctivae and EOM are normal. Pupils are equal, round, and reactive to light. No scleral icterus.  Neck: Normal range of motion. Neck supple.  Cardiovascular: Normal rate, regular rhythm, normal heart sounds and intact distal pulses.   No murmur heard. Pulmonary/Chest: Effort normal and breath sounds normal. No respiratory distress. She has no wheezes. She has no rales.  Musculoskeletal: She exhibits no edema.  Lymphadenopathy:       Head (right side):  Submandibular (mild) adenopathy present.  Skin: Skin is warm and dry. No rash noted.      Assessment & Plan:

## 2011-02-26 NOTE — Patient Instructions (Signed)
You have pharyngitis, likely viral. Push fluids and plenty of rest. May use ibuprofen for throat inflammation. Salt water gargles. Suck on cold things like popsicles or warm things like herbal teas (whichever soothes the throat better). Return if fever >101.5, worsening pain, or trouble opening/closing mouth, or hoarse voice. Good to see you today, call clinic with questions. 

## 2011-06-12 ENCOUNTER — Telehealth: Payer: Self-pay

## 2011-06-12 NOTE — Telephone Encounter (Signed)
Patient is really sick with the flu and has a very bad cough called she requested to have a  Zpack called in to her pharmacy [facility].

## 2011-06-19 ENCOUNTER — Telehealth: Payer: Self-pay | Admitting: *Deleted

## 2011-06-19 NOTE — Telephone Encounter (Signed)
Test to check labs/tn

## 2011-07-02 ENCOUNTER — Other Ambulatory Visit: Payer: Self-pay | Admitting: Gynecology

## 2011-07-02 DIAGNOSIS — I10 Essential (primary) hypertension: Secondary | ICD-10-CM

## 2011-07-02 DIAGNOSIS — K589 Irritable bowel syndrome without diarrhea: Secondary | ICD-10-CM

## 2011-07-02 DIAGNOSIS — G43909 Migraine, unspecified, not intractable, without status migrainosus: Secondary | ICD-10-CM

## 2011-07-02 MED ORDER — METOPROLOL SUCCINATE ER 25 MG PO TB24: 25.0000 mg | ORAL_TABLET | Freq: Every day | ORAL | Status: DC

## 2011-07-02 MED ORDER — DICYCLOMINE HCL 20 MG PO TABS: ORAL_TABLET | ORAL | Status: DC

## 2011-07-02 MED ORDER — VENLAFAXINE HCL ER 75 MG PO CP24: 75.0000 mg | ORAL_CAPSULE | Freq: Every day | ORAL | Status: DC

## 2011-07-02 MED ORDER — LOSARTAN POTASSIUM-HCTZ 100-25 MG PO TABS: 1.0000 | ORAL_TABLET | Freq: Every day | ORAL | Status: DC

## 2011-07-02 MED ORDER — NAPROXEN 500 MG PO TABS: 500.0000 mg | ORAL_TABLET | Freq: Two times a day (BID) | ORAL | Status: DC

## 2011-07-02 NOTE — Telephone Encounter (Signed)
Medication refill per Jannifer Rodney, NP.

## 2011-07-17 NOTE — Telephone Encounter (Signed)
Need refill of meds

## 2011-11-19 ENCOUNTER — Other Ambulatory Visit (INDEPENDENT_AMBULATORY_CARE_PROVIDER_SITE_OTHER): Payer: Self-pay | Admitting: General Surgery

## 2011-11-19 ENCOUNTER — Ambulatory Visit (INDEPENDENT_AMBULATORY_CARE_PROVIDER_SITE_OTHER): Payer: Commercial Managed Care - PPO | Admitting: General Surgery

## 2011-11-19 ENCOUNTER — Encounter (INDEPENDENT_AMBULATORY_CARE_PROVIDER_SITE_OTHER): Payer: Self-pay

## 2011-11-19 VITALS — BP 128/80 | HR 101 | Temp 97.2°F | Resp 20 | Ht 60.0 in | Wt 211.6 lb

## 2011-11-19 DIAGNOSIS — Z6841 Body Mass Index (BMI) 40.0 and over, adult: Secondary | ICD-10-CM

## 2011-11-19 NOTE — Progress Notes (Signed)
Patient ID: Kimberly [name], female   DOB: Aug 05, 1970, 41 y.o.   MRN: [id number]  Chief Complaint  Patient presents with  . Bariatric Pre-op    HPI Kimberly [name] is a 41 y.o. female.  The patient presents for her initial weight loss surgery consultation.  She has a BMI of 41 with comorbidities of hypertension, irritable bowel, headaches and reflux.  She has attended our information session and is interested in the sleeve gastrectomy.  She has some acquaitances that have had the sleeve and have done well.  She has struggled with her weight since just before her kids and has been even more of a problem since the birth of her children.  SHe has done several diets including calorie counting(which was the most effective for her), Herbalife, and has been on a prescription appetite suppressant.  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  She does have some mild reflux symptoms about 2/week but does not take anything for this other than an occasional tums.  HPI  Past Medical History  Diagnosis Date  . Chronic headaches   . HTN (hypertension)   . Hx: UTI (urinary tract infection)   . Radial styloid tenosynovitis   . Irritable bowel syndrome   . GERD (gastroesophageal reflux disease)   . Arthritis     Past Surgical History  Procedure Date  . Breast reduction surgery   . Colonoscopy 10/18/10    normal, consistent with IBS  . Breast surgery 1996    reduct  . Cesarean section 2001    Family History  Problem Relation Age of Onset  . Ovarian cancer Paternal Grandmother   . Colitis Father   . Crohn's disease Father   . Diabetes Father   . Diverticulitis Father   . Diabetes Mother   . Diabetes Sister   . Irritable bowel syndrome Sister   . Irritable bowel syndrome Brother   . Colon cancer Paternal Uncle     Social History History  Substance Use Topics  . Smoking status: Never Smoker   . Smokeless tobacco: Never Used  . Alcohol Use: Yes     Comment: Rare    No Active  Allergies  Current Outpatient Prescriptions  Medication Sig Dispense Refill  . losartan-hydrochlorothiazide (HYZAAR) 100-25 MG per tablet Take 1 tablet by mouth daily.  90 tablet  3  . metoprolol succinate (TOPROL-XL) 25 MG 24 hr tablet Take 1 tablet (25 mg total) by mouth daily.  90 tablet  3  . naproxen (NAPROSYN) 500 MG tablet Take 1 tablet (500 mg total) by mouth 2 (two) times daily with a meal.  180 tablet  3  . Norethin-Eth Estrad-Fe Biphas (LO LOESTRIN FE) 1 MG-10 MCG / 10 MCG TABS Take 1 tablet by mouth daily.        . SUMAtriptan (IMITREX) 100 MG tablet Take 100 mg by mouth. As needed       . tizanidine (ZANAFLEX) 2 MG capsule Take 2 mg by mouth 3 (three) times daily.      Marland Kitchen venlafaxine XR (EFFEXOR-XR) 75 MG 24 hr capsule Take 1 capsule (75 mg total) by mouth daily.  90 capsule  3  . dicyclomine (BENTYL) 20 MG tablet 3 times daily ac meals as needed (or every 6 hrs)  90 tablet  2  . Multiple Vitamin (MULTIVITAMIN) tablet Take 1 tablet by mouth daily.          Review of Systems Review of Systems All other review of  systems negative or noncontributory except as stated in the HPI  Blood pressure 128/80, pulse 101, temperature 97.2 F (36.2 C), temperature source Temporal, resp. rate 20, height 5' (1.524 m), weight 211 lb 9.6 oz (95.981 kg).  Physical Exam Physical Exam Physical Exam  Nursing note and vitals reviewed. Constitutional: She is oriented to person, place, and time. She appears well-developed and well-nourished. No distress.  HENT:  Head: Normocephalic and atraumatic.  Mouth/Throat: No oropharyngeal exudate.  Eyes: Conjunctivae and EOM are normal. Pupils are equal, round, and reactive to light. Right eye exhibits no discharge. Left eye exhibits no discharge. No scleral icterus.  Neck: Normal range of motion. Neck supple. No tracheal deviation present.  Cardiovascular: Normal rate, regular rhythm, normal heart sounds and intact distal pulses.   Pulmonary/Chest: Effort  normal and breath sounds normal. No stridor. No respiratory distress. She has no wheezes.  Abdominal: Soft. Bowel sounds are normal. She exhibits no distension and no mass. There is no tenderness. There is no rebound and no guarding.  Musculoskeletal: Normal range of motion. She exhibits no edema and no tenderness.  Neurological: She is alert and oriented to person, place, and time.  Skin: Skin is warm and dry. No rash noted. She is not diaphoretic. No erythema. No pallor.  Psychiatric: She has a normal mood and affect. Her behavior is normal. Judgment and thought content normal.    Data Reviewed   Assessment    Morbid obesity with a BMI of 41and obesity related comorbidities of hypertension and reflux We had a long discussion regarding all of the medical and surgical options for weight loss. We discussed the LAP-BAND, a sleeve gastrectomy, and the Roux-en-Y gastric bypass and the risks and benefits of the pros and cons of each option. The risks of infection, bleeding, pain, scarring, weight regain, too little or too much weight loss, vitamin deficiencies and need for lifelong vitamin supplementation, hair loss, need for protein supplementation, leaks, stricture, reflux, food intolerance, need for reoperation and conversion to roux Y gastric bypass.  She expressed understanding and would like to proceed with workup for vertical sleeve gastrectomy. She does have some occasional reflux but she says that this is only a couple times per week and she does not take anything for this. I discussed with her the possibility of worsening reflux symptoms and she expressed understanding of and still would like to proceed with workup for sleeve gastrectomy. She understands that if her reflux becomes too severe that medication is not controlling this, she would require conversion to Roux-en-Y gastric bypass. We spent about 45 minutes counseling this patient.    Plan    We will have her start with a preoperative  workup including laboratory studies, upper GI, psychology and nutrition evaluations       Keyerra Lamere DAVID 11/19/2011, 4:50 PM

## 2011-12-05 ENCOUNTER — Other Ambulatory Visit: Payer: Self-pay

## 2011-12-05 ENCOUNTER — Ambulatory Visit (HOSPITAL_COMMUNITY)
Admission: RE | Admit: 2011-12-05 | Discharge: 2011-12-05 | Disposition: A | Discharge status: Home / Self Care | Payer: 59 | Source: Ambulatory Visit | Attending: General Surgery | Admitting: General Surgery

## 2011-12-05 DIAGNOSIS — Z01818 Encounter for other preprocedural examination: Secondary | ICD-10-CM | POA: Insufficient documentation

## 2011-12-05 DIAGNOSIS — Z6841 Body Mass Index (BMI) 40.0 and over, adult: Secondary | ICD-10-CM

## 2011-12-05 DIAGNOSIS — K219 Gastro-esophageal reflux disease without esophagitis: Secondary | ICD-10-CM | POA: Insufficient documentation

## 2011-12-09 LAB — CBC WITH DIFFERENTIAL/PLATELET
Eosinophils Relative: 1 % (ref 0–5)
HCT: 41.6 % (ref 36.0–46.0)
Hemoglobin: 14.4 g/dL (ref 12.0–15.0)
Lymphocytes Relative: 30 % (ref 12–46)
Lymphs Abs: 2.3 10*3/uL (ref 0.7–4.0)
MCV: 93.3 fL (ref 78.0–100.0)
Monocytes Absolute: 0.5 10*3/uL (ref 0.1–1.0)
Monocytes Relative: 6 % (ref 3–12)
RBC: 4.46 MIL/uL (ref 3.87–5.11)
WBC: 7.8 10*3/uL (ref 4.0–10.5)

## 2011-12-09 LAB — TSH: TSH: 1.46 u[IU]/mL (ref 0.350–4.500)

## 2011-12-09 LAB — LIPID PANEL
Cholesterol: 204 mg/dL — ABNORMAL HIGH (ref 0–200)
VLDL: 24 mg/dL (ref 0–40)

## 2011-12-09 LAB — COMPREHENSIVE METABOLIC PANEL
Albumin: 4 g/dL (ref 3.5–5.2)
BUN: 15 mg/dL (ref 6–23)
CO2: 26 mEq/L (ref 19–32)
Calcium: 9.4 mg/dL (ref 8.4–10.5)
Chloride: 100 mEq/L (ref 96–112)
Creat: 0.65 mg/dL (ref 0.50–1.10)
Potassium: 4.5 mEq/L (ref 3.5–5.3)

## 2011-12-09 LAB — H. PYLORI ANTIBODY, IGG: H Pylori IgG: <0.4 {ISR}

## 2011-12-13 ENCOUNTER — Encounter: Payer: 59 | Attending: General Surgery | Admitting: *Deleted

## 2011-12-13 ENCOUNTER — Encounter: Payer: Self-pay | Admitting: *Deleted

## 2011-12-13 DIAGNOSIS — Z713 Dietary counseling and surveillance: Secondary | ICD-10-CM | POA: Insufficient documentation

## 2011-12-13 DIAGNOSIS — Z01818 Encounter for other preprocedural examination: Secondary | ICD-10-CM | POA: Insufficient documentation

## 2011-12-13 NOTE — Patient Instructions (Addendum)
   Follow Pre-Op Nutrition Goals to prepare for Gastric Sleeve Surgery.   Call the [facility] at [phone] once you have been given your surgery date to enrolled in the Pre-Op Nutrition Class. You will need to attend this nutrition class 3-4 weeks prior to your surgery.  

## 2011-12-13 NOTE — Progress Notes (Signed)
  Pre-Op Assessment Visit:  Pre-Operative Gastric Sleeve Surgery  Medical Nutrition Therapy:  Appt start time: 0915   End time:  1015.  Patient was seen on 12/13/2011 for Pre-Operative Gastric Sleeve Nutrition Assessment. Assessment and letter of approval faxed to [facility] Bariatric Surgery Program coordinator on 12/15/11.  Approval letter sent to [facility] and will be available in the chart under the media tab.  TANITA  BODY COMP RESULTS  12/13/11   Fat Mass (lbs) 104.0   Fat Free Mass (lbs) 105.5   Total Body Water (lbs) 77.0   Handouts given during visit include:  Pre-Op Goals   Bariatric Surgery Protein Shakes  Patient to call for Pre-Op and Post-Op Nutrition Education at the [facility] when surgery is scheduled.

## 2011-12-17 ENCOUNTER — Ambulatory Visit: Payer: 59 | Admitting: *Deleted

## 2012-01-02 ENCOUNTER — Other Ambulatory Visit (INDEPENDENT_AMBULATORY_CARE_PROVIDER_SITE_OTHER): Payer: Self-pay | Admitting: General Surgery

## 2012-01-02 DIAGNOSIS — E669 Obesity, unspecified: Secondary | ICD-10-CM

## 2012-04-08 ENCOUNTER — Ambulatory Visit: Payer: 59

## 2012-04-15 ENCOUNTER — Ambulatory Visit (INDEPENDENT_AMBULATORY_CARE_PROVIDER_SITE_OTHER): Payer: Commercial Managed Care - PPO | Admitting: General Surgery

## 2012-04-26 ENCOUNTER — Inpatient Hospital Stay (HOSPITAL_COMMUNITY): Admission: RE | Admit: 2012-04-26 | Payer: 59 | Source: Ambulatory Visit | Admitting: General Surgery

## 2012-04-26 ENCOUNTER — Encounter (HOSPITAL_COMMUNITY): Admission: RE | Payer: Self-pay | Source: Ambulatory Visit

## 2012-04-26 SURGERY — GASTRECTOMY, SLEEVE, LAPAROSCOPIC
Anesthesia: General

## 2012-05-21 ENCOUNTER — Ambulatory Visit (INDEPENDENT_AMBULATORY_CARE_PROVIDER_SITE_OTHER): Payer: Commercial Managed Care - PPO | Admitting: General Surgery

## 2012-05-24 ENCOUNTER — Encounter: Payer: Self-pay | Admitting: Family Medicine

## 2012-06-03 ENCOUNTER — Ambulatory Visit (INDEPENDENT_AMBULATORY_CARE_PROVIDER_SITE_OTHER): Payer: Commercial Managed Care - PPO | Admitting: General Surgery

## 2012-06-03 ENCOUNTER — Encounter (INDEPENDENT_AMBULATORY_CARE_PROVIDER_SITE_OTHER): Payer: Self-pay | Admitting: General Surgery

## 2012-06-03 DIAGNOSIS — Z6841 Body Mass Index (BMI) 40.0 and over, adult: Secondary | ICD-10-CM

## 2012-06-03 DIAGNOSIS — I10 Essential (primary) hypertension: Secondary | ICD-10-CM

## 2012-06-03 NOTE — Progress Notes (Signed)
Patient ID: Kimberly [name], female   DOB: [birth year], 42 y.o.   MRN: [id number]  Chief Complaint  Patient presents with  . Bariatric Pre-op    gastric sleeve    HPI Kimberly [name] is a 42 y.o. female.  This patient comes in today for her preoperative surgical evaluation in preparation for microscopic vertical sleeve gastrectomy scheduled for June 24. She has a BMI of 42 with obesity related comorbidities of hypertension and depression and remains interested in a vertical sleeve gastrectomy. She says that since our last visit she has only had 3 episodes of heartburn and still only has very rare rare episodes of this. She did have some reflux on her upper GI and but otherwise normal and her laboratory studies are essentially normal. HPI  Past Medical History  Diagnosis Date  . Chronic headaches   . HTN (hypertension)   . Hx: UTI (urinary tract infection)   . Radial styloid tenosynovitis   . Irritable bowel syndrome   . GERD (gastroesophageal reflux disease)   . Arthritis   . Morbid obesity with BMI of 40.0-44.9, adult 12/13/11    BMI 40.9    Past Surgical History  Procedure Laterality Date  . Breast reduction surgery    . Colonoscopy  10/18/10    normal, consistent with IBS  . Breast surgery  1996    reduct  . Cesarean section  2001    Family History  Problem Relation Age of Onset  . Ovarian cancer Paternal Grandmother   . Colitis Father   . Crohn's disease Father   . Diabetes Father   . Diverticulitis Father   . Diabetes Mother   . Diabetes Sister   . Irritable bowel syndrome Sister   . Irritable bowel syndrome Brother   . Colon cancer Paternal Uncle     Social History History  Substance Use Topics  . Smoking status: Never Smoker   . Smokeless tobacco: Never Used  . Alcohol Use: Yes     Comment: Rare    No Known Allergies  Current Outpatient Prescriptions  Medication Sig Dispense Refill  . Etonogestrel-Ethinyl Estradiol (NUVARING VA) Place vaginally.      Marland Kitchen  losartan-hydrochlorothiazide (HYZAAR) 100-25 MG per tablet Take 1 tablet by mouth daily.  90 tablet  3  . metoprolol succinate (TOPROL-XL) 25 MG 24 hr tablet Take 1 tablet (25 mg total) by mouth daily.  90 tablet  3  . Multiple Vitamin (MULTIVITAMIN) tablet Take 1 tablet by mouth daily.        . naproxen (NAPROSYN) 500 MG tablet Take 1 tablet (500 mg total) by mouth 2 (two) times daily with a meal.  180 tablet  3  . SUMAtriptan (IMITREX) 100 MG tablet Take 100 mg by mouth. As needed       . tizanidine (ZANAFLEX) 2 MG capsule Take 2 mg by mouth 3 (three) times daily.      Marland Kitchen venlafaxine XR (EFFEXOR-XR) 75 MG 24 hr capsule Take 1 capsule (75 mg total) by mouth daily.  90 capsule  3   No current facility-administered medications for this visit.    Review of Systems Review of Systems All other review of systems negative or noncontributory except as stated in the HPI  Blood pressure 134/98, pulse 103, temperature 97.5 F (36.4 C), temperature source Temporal, height 5' (1.524 m), weight 214 lb (97.07 kg), SpO2 97.00%.  Physical Exam Physical Exam Physical Exam  Nursing note and vitals reviewed. Constitutional: She is oriented to  person, place, and time. She appears well-developed and well-nourished. No distress.  HENT:  Head: Normocephalic and atraumatic.  Mouth/Throat: No oropharyngeal exudate.  Eyes: Conjunctivae and EOM are normal. Pupils are equal, round, and reactive to light. Right eye exhibits no discharge. Left eye exhibits no discharge. No scleral icterus.  Neck: Normal range of motion. Neck supple. No tracheal deviation present.  Cardiovascular: Normal rate, regular rhythm, normal heart sounds and intact distal pulses.   Pulmonary/Chest: Effort normal and breath sounds normal. No stridor. No respiratory distress. She has no wheezes.  Abdominal: Soft. Bowel sounds are normal. She exhibits no distension and no mass. There is no tenderness. There is no rebound and no guarding.   Musculoskeletal: Normal range of motion. She exhibits no edema and no tenderness.  Neurological: She is alert and oriented to person, place, and time.  Skin: Skin is warm and dry. No rash noted. She is not diaphoretic. No erythema. No pallor.  Psychiatric: She has a normal mood and affect. Her behavior is normal. Judgment and thought content normal.    Data Reviewed   Assessment    Morbid obesity with a BMI of 42 and obesity related comorbidities of hypertension and depression She remains interested in vertical sleeve gastrectomy and she's scheduled for her procedure on June 24. He had a long discussion regarding the perioperative and postoperative course as well as the procedure and its risks. The risks of infection, bleeding, pain, scarring, weight regain, too little or too much weight loss, vitamin deficiencies and need for lifelong vitamin supplementation, hair loss, need for protein supplementation, leaks, stricture, reflux, food intolerance, need for reoperation and conversion to roux Y gastric bypass, need for open surgery, injury to spleen or surrounding structures, DVT's, PE, and death again discussed with the patient and the patient expressed understanding and desires to proceed with laparoscopic vertical sleeve gastrectomy, possible open, intraoperative endoscopy. I again explained the she may have an increased risk of worsening reflux and the need for lifelong medications or possibly even the need for conversion to Roux-en-Y gastric bypass and she expressed understanding and desires to continue with sleeve gastrectomy.      Plan    We will plan for vertical sleeve gastrectomy on June 24 and she will start her preoperative diet next week        Hermann Dottavio DAVID 06/03/2012, 3:19 PM

## 2012-06-04 ENCOUNTER — Ambulatory Visit (INDEPENDENT_AMBULATORY_CARE_PROVIDER_SITE_OTHER): Payer: Commercial Managed Care - PPO | Admitting: General Surgery

## 2012-06-07 ENCOUNTER — Encounter: Payer: 59 | Attending: General Surgery | Admitting: *Deleted

## 2012-06-07 DIAGNOSIS — Z713 Dietary counseling and surveillance: Secondary | ICD-10-CM | POA: Insufficient documentation

## 2012-06-07 NOTE — Progress Notes (Addendum)
Pre-Operative Bariatric Surgery Nutrition Visit   Appt start time: 1530   End time:  1630.  Patient was seen on 06/07/2012 for Pre-Operative Bariatric Surgery Education at [facility].   Surgery date: 06/29/12 Surgery type: SLEEVE Start weight at [facility]:  209.5 lbs Goal weight: 130 lbs  Weight today: 214.5 lbs Weight change: 5 lb GAIN Total weight loss: N/A  TANITA  BODY COMP RESULTS  12/13/11 06/07/12   BMI (kg/m^2) 40.9 41.9   Fat Mass (lbs) 104.0 102.5   Fat Free Mass (lbs) 105.5 112.0   Total Body Water (lbs) 77.0 82.0   Samples given per MNT protocol; Patient educated on appropriate usage: *NOTE: 1 ea given unless otherwise specified  Bariatric Advantage Multivitamin Lot # [id number] Exp: 06/15  Celebrate Vitamins Multivitamin Lot # [id number]; Exp: 01/15 Lot # [id number]; Exp: 07/15  Celebrate Vitamins Calcium Citrate Lot # [id number] Exp: 08/15  Celebrate Vitamins Sublingual B12 Lot # [id number] Exp: 11/15  Celebrate Vitamins Iron + C (18 mg) Lot # [id number] Exp: 03/15  Corliss Marcus Protein Powder Lot # [id number]; Exp: 09/15 Lot # [id number]; Exp: 07/15 Lot # [id number]; Exp: 09/15  Premier Protein Shake Lot # [id number] Exp: 01/18/13  The following the learning objective met by patient during this visit:  Identify Pre-Op Dietary Goals and will begin 2 weeks pre-operatively  Identify appropriate sources of fluids and proteins   State protein recommendations and appropriate sources pre and post-operatively  Identify Post-Operative Dietary Goals and will follow for 2 weeks post-operatively  Identify appropriate multivitamin and calcium sources  Describe the need for physical activity post-operatively and will follow MD recommendations  State when to call healthcare provider regarding medication questions or post-operative complications  Handouts given during visit include:  Pre-Op Bariatric Surgery Diet Handout  Protein Shake Handout  Post-Op Bariatric  Surgery Nutrition Handout  BELT Program Information Flyer  Support Group Information Flyer  [facility] Bariatric Supplements Price List  Follow-Up Plan: Patient will follow-up at [place] 2 weeks post operatively for diet advancement per MD.

## 2012-06-07 NOTE — Patient Instructions (Addendum)
Follow:   Pre-Op Diet per MD 2 weeks prior to surgery  Phase 2- Liquids (clear/full) 2 weeks after surgery  Vitamin/Mineral/Calcium guidelines for purchasing bariatric supplements  Exercise guidelines pre and post-op per MD  Follow-up at [facility] in 2 weeks post-op for diet advancement. Contact Tonio Seider as needed with questions/concerns. 

## 2012-06-15 NOTE — Progress Notes (Signed)
Need orders when possible please - pt coming for pre-op on Fri 06/18/12 - thank you

## 2012-06-17 ENCOUNTER — Ambulatory Visit (INDEPENDENT_AMBULATORY_CARE_PROVIDER_SITE_OTHER): Payer: Commercial Managed Care - PPO | Admitting: General Surgery

## 2012-06-17 NOTE — Progress Notes (Signed)
Need orders please - pt coming for preop tomorrow 06-18-12 - thank you

## 2012-06-18 ENCOUNTER — Ambulatory Visit (HOSPITAL_COMMUNITY)
Admission: RE | Admit: 2012-06-18 | Discharge: 2012-06-18 | Disposition: A | Discharge status: Home / Self Care | Payer: 59 | Source: Ambulatory Visit | Attending: General Surgery | Admitting: General Surgery

## 2012-06-18 ENCOUNTER — Encounter (HOSPITAL_COMMUNITY): Payer: Self-pay | Admitting: Pharmacy Technician

## 2012-06-18 ENCOUNTER — Encounter (HOSPITAL_COMMUNITY)
Admission: RE | Admit: 2012-06-18 | Discharge: 2012-06-18 | Disposition: A | Discharge status: Home / Self Care | Payer: 59 | Source: Ambulatory Visit | Attending: General Surgery | Admitting: General Surgery

## 2012-06-18 ENCOUNTER — Encounter (HOSPITAL_COMMUNITY): Payer: Self-pay

## 2012-06-18 DIAGNOSIS — Z8709 Personal history of other diseases of the respiratory system: Secondary | ICD-10-CM | POA: Insufficient documentation

## 2012-06-18 DIAGNOSIS — Z01818 Encounter for other preprocedural examination: Secondary | ICD-10-CM | POA: Insufficient documentation

## 2012-06-18 DIAGNOSIS — Z01812 Encounter for preprocedural laboratory examination: Secondary | ICD-10-CM | POA: Insufficient documentation

## 2012-06-18 DIAGNOSIS — M439 Deforming dorsopathy, unspecified: Secondary | ICD-10-CM | POA: Insufficient documentation

## 2012-06-18 HISTORY — DX: Nausea with vomiting, unspecified: R11.2

## 2012-06-18 HISTORY — DX: Other specified postprocedural states: Z98.890

## 2012-06-18 LAB — BASIC METABOLIC PANEL
CO2: 29 mEq/L (ref 19–32)
Calcium: 9.7 mg/dL (ref 8.4–10.5)
Creatinine, Ser: 0.53 mg/dL (ref 0.50–1.10)
GFR calc Af Amer: >90 mL/min (ref >90)

## 2012-06-18 LAB — HCG, SERUM, QUALITATIVE: Preg, Serum: NEGATIVE

## 2012-06-18 LAB — CBC
MCH: 31.6 pg (ref 26.0–34.0)
MCV: 94.4 fL (ref 78.0–100.0)
Platelets: 302 10*3/uL (ref 150–400)
RDW: 12.7 % (ref 11.5–15.5)

## 2012-06-18 NOTE — Patient Instructions (Addendum)
[address]  06/18/2012   Your procedure is scheduled on: 06/29/12  Report to [facility] at 0745 AM.  Call this number if you have problems the morning of surgery 336-: [phone]   Remember:   Do not eat food or drink liquids After Midnight.     Take these medicines the morning of surgery with A SIP OF WATER: metoprolol, effexor   Do not wear jewelry, make-up or nail polish.  Do not wear lotions, powders, or perfumes. You may wear deodorant.  Do not shave 48 hours prior to surgery. Men may shave face and neck.  Do not bring valuables to the hospital.  Contacts, dentures or bridgework may not be worn into surgery.  Leave suitcase in the car. After surgery it may be brought to your room.  For patients admitted to the hospital, checkout time is 11:00 AM the day of discharge.    Please read over the following fact sheets that you were given: MRSA Information.  Birdie Sons, RN  pre op nurse call if needed [phone]    FAILURE TO FOLLOW THESE INSTRUCTIONS MAY RESULT IN CANCELLATION OF YOUR SURGERY   Patient Signature: ___________________________________________

## 2012-06-18 NOTE — Progress Notes (Signed)
EKG 12/05/11 on EPIC

## 2012-06-29 ENCOUNTER — Encounter (HOSPITAL_COMMUNITY): Payer: Self-pay | Admitting: Anesthesiology

## 2012-06-29 ENCOUNTER — Inpatient Hospital Stay (HOSPITAL_COMMUNITY)
Admission: RE | Admit: 2012-06-29 | Discharge: 2012-07-01 | DRG: 621 | Disposition: A | Discharge status: Home / Self Care | Payer: 59 | Source: Ambulatory Visit | Attending: General Surgery | Admitting: General Surgery

## 2012-06-29 ENCOUNTER — Encounter (HOSPITAL_COMMUNITY)
Admission: RE | Disposition: A | Discharge status: Home / Self Care | Payer: Self-pay | Source: Ambulatory Visit | Attending: General Surgery

## 2012-06-29 ENCOUNTER — Other Ambulatory Visit (INDEPENDENT_AMBULATORY_CARE_PROVIDER_SITE_OTHER): Payer: Self-pay | Admitting: General Surgery

## 2012-06-29 ENCOUNTER — Inpatient Hospital Stay (HOSPITAL_COMMUNITY): Payer: 59 | Admitting: Anesthesiology

## 2012-06-29 ENCOUNTER — Encounter (HOSPITAL_COMMUNITY): Payer: Self-pay | Admitting: *Deleted

## 2012-06-29 DIAGNOSIS — I10 Essential (primary) hypertension: Secondary | ICD-10-CM | POA: Diagnosis present

## 2012-06-29 DIAGNOSIS — Z6841 Body Mass Index (BMI) 40.0 and over, adult: Secondary | ICD-10-CM

## 2012-06-29 DIAGNOSIS — F329 Major depressive disorder, single episode, unspecified: Secondary | ICD-10-CM | POA: Diagnosis present

## 2012-06-29 DIAGNOSIS — K219 Gastro-esophageal reflux disease without esophagitis: Secondary | ICD-10-CM | POA: Diagnosis present

## 2012-06-29 DIAGNOSIS — Z302 Encounter for sterilization: Secondary | ICD-10-CM

## 2012-06-29 DIAGNOSIS — G43909 Migraine, unspecified, not intractable, without status migrainosus: Secondary | ICD-10-CM | POA: Diagnosis present

## 2012-06-29 DIAGNOSIS — F3289 Other specified depressive episodes: Secondary | ICD-10-CM | POA: Diagnosis present

## 2012-06-29 HISTORY — PX: LAPAROSCOPIC GASTRIC SLEEVE RESECTION: SHX5895

## 2012-06-29 HISTORY — PX: LAPAROSCOPIC BILATERAL SALPINGECTOMY: SHX5889

## 2012-06-29 HISTORY — PX: ESOPHAGOGASTRODUODENOSCOPY: SHX5428

## 2012-06-29 SURGERY — GASTRECTOMY, SLEEVE, LAPAROSCOPIC
Anesthesia: General | Site: Esophagus | Wound class: Clean Contaminated

## 2012-06-29 MED ORDER — PROMETHAZINE HCL 25 MG/ML IJ SOLN
6.2500 mg | INTRAMUSCULAR | Status: DC | PRN
  Administered 2012-06-29: 6.25 mg via INTRAVENOUS

## 2012-06-29 MED ORDER — OXYCODONE HCL 5 MG/5ML PO SOLN: 5.0000 mg | Freq: Once | ORAL | Status: DC | PRN

## 2012-06-29 MED ORDER — FENTANYL CITRATE 0.05 MG/ML IJ SOLN
INTRAMUSCULAR | Status: DC | PRN
  Administered 2012-06-29: 25 ug via INTRAVENOUS
  Administered 2012-06-29 (×4): 50 ug via INTRAVENOUS
  Administered 2012-06-29: 25 ug via INTRAVENOUS

## 2012-06-29 MED ORDER — TISSEEL VH 10 ML EX KIT
PACK | CUTANEOUS | Status: DC | PRN
  Administered 2012-06-29: 1

## 2012-06-29 MED ORDER — KETAMINE HCL 10 MG/ML IJ SOLN
INTRAMUSCULAR | Status: DC | PRN
  Administered 2012-06-29: 20 mg via INTRAVENOUS

## 2012-06-29 MED ORDER — MORPHINE SULFATE 2 MG/ML IJ SOLN
2.0000 mg | INTRAMUSCULAR | Status: DC | PRN
  Administered 2012-06-29 – 2012-06-30 (×6): 2 mg via INTRAVENOUS
  Filled 2012-06-29 (×6): qty 1

## 2012-06-29 MED ORDER — UNJURY CHICKEN SOUP POWDER: 2.0000 [oz_av] | Freq: Four times a day (QID) | ORAL | Status: DC

## 2012-06-29 MED ORDER — LACTATED RINGERS IV SOLN: INTRAVENOUS | Status: DC | PRN

## 2012-06-29 MED ORDER — LACTATED RINGERS IV SOLN
INTRAVENOUS | Status: DC
  Administered 2012-06-29: 12:00:00 via INTRAVENOUS
  Administered 2012-06-29: 1000 mL via INTRAVENOUS

## 2012-06-29 MED ORDER — SCOPOLAMINE 1 MG/3DAYS TD PT72
1.0000 | MEDICATED_PATCH | TRANSDERMAL | Status: DC
  Administered 2012-06-29: 1 via TRANSDERMAL
  Filled 2012-06-29: qty 1

## 2012-06-29 MED ORDER — MEPERIDINE HCL 50 MG/ML IJ SOLN: 6.2500 mg | INTRAMUSCULAR | Status: DC | PRN

## 2012-06-29 MED ORDER — ONDANSETRON HCL 4 MG/2ML IJ SOLN
4.0000 mg | INTRAMUSCULAR | Status: DC | PRN
  Administered 2012-06-29 – 2012-06-30 (×5): 4 mg via INTRAVENOUS
  Filled 2012-06-29 (×5): qty 2

## 2012-06-29 MED ORDER — LABETALOL HCL 5 MG/ML IV SOLN
10.0000 mg | INTRAVENOUS | Status: DC | PRN
  Administered 2012-06-30: 10 mg via INTRAVENOUS
  Filled 2012-06-29 (×2): qty 4

## 2012-06-29 MED ORDER — GLYCOPYRROLATE 0.2 MG/ML IJ SOLN
INTRAMUSCULAR | Status: DC | PRN
  Administered 2012-06-29: 0.6 mg via INTRAVENOUS

## 2012-06-29 MED ORDER — ENOXAPARIN SODIUM 40 MG/0.4ML ~~LOC~~ SOLN
40.0000 mg | Freq: Two times a day (BID) | SUBCUTANEOUS | Status: DC
  Administered 2012-06-30 (×2): 40 mg via SUBCUTANEOUS
  Filled 2012-06-29 (×5): qty 0.4

## 2012-06-29 MED ORDER — ROCURONIUM BROMIDE 100 MG/10ML IV SOLN
INTRAVENOUS | Status: DC | PRN
  Administered 2012-06-29: 20 mg via INTRAVENOUS
  Administered 2012-06-29: 50 mg via INTRAVENOUS

## 2012-06-29 MED ORDER — KCL IN DEXTROSE-NACL 20-5-0.45 MEQ/L-%-% IV SOLN
INTRAVENOUS | Status: DC
  Administered 2012-06-29 – 2012-06-30 (×3): via INTRAVENOUS
  Filled 2012-06-29 (×4): qty 1000

## 2012-06-29 MED ORDER — OXYCODONE HCL 5 MG PO TABS: 5.0000 mg | ORAL_TABLET | Freq: Once | ORAL | Status: DC | PRN

## 2012-06-29 MED ORDER — PANTOPRAZOLE SODIUM 40 MG IV SOLR
40.0000 mg | INTRAVENOUS | Status: DC
  Administered 2012-06-29 – 2012-06-30 (×2): 40 mg via INTRAVENOUS
  Filled 2012-06-29 (×3): qty 40

## 2012-06-29 MED ORDER — PROPOFOL INFUSION 10 MG/ML OPTIME
INTRAVENOUS | Status: DC | PRN
  Administered 2012-06-29: 200 mL via INTRAVENOUS

## 2012-06-29 MED ORDER — UNJURY CHOCOLATE CLASSIC POWDER: 2.0000 [oz_av] | Freq: Four times a day (QID) | ORAL | Status: DC

## 2012-06-29 MED ORDER — DEXAMETHASONE SODIUM PHOSPHATE 10 MG/ML IJ SOLN
INTRAMUSCULAR | Status: DC | PRN
  Administered 2012-06-29: 10 mg via INTRAVENOUS

## 2012-06-29 MED ORDER — ACETAMINOPHEN 10 MG/ML IV SOLN: 1000.0000 mg | Freq: Once | INTRAVENOUS | Status: DC | PRN

## 2012-06-29 MED ORDER — SODIUM CHLORIDE 0.9 % IV SOLN
1.0000 g | INTRAVENOUS | Status: AC
  Administered 2012-06-29: 1 g via INTRAVENOUS
  Filled 2012-06-29: qty 1

## 2012-06-29 MED ORDER — LIDOCAINE-EPINEPHRINE 1 %-1:100000 IJ SOLN
INTRAMUSCULAR | Status: DC | PRN
  Administered 2012-06-29: 30 mL

## 2012-06-29 MED ORDER — LIDOCAINE HCL (CARDIAC) 20 MG/ML IV SOLN
INTRAVENOUS | Status: DC | PRN
  Administered 2012-06-29: 80 mg via INTRAVENOUS

## 2012-06-29 MED ORDER — OXYCODONE-ACETAMINOPHEN 5-325 MG/5ML PO SOLN
5.0000 mL | ORAL | Status: DC | PRN
  Administered 2012-06-30: 5 mL via ORAL
  Filled 2012-06-29: qty 5

## 2012-06-29 MED ORDER — BUPIVACAINE HCL 0.25 % IJ SOLN
INTRAMUSCULAR | Status: DC | PRN
  Administered 2012-06-29: 30 mL

## 2012-06-29 MED ORDER — MIDAZOLAM HCL 5 MG/5ML IJ SOLN
INTRAMUSCULAR | Status: DC | PRN
  Administered 2012-06-29: 2 mg via INTRAVENOUS

## 2012-06-29 MED ORDER — LACTATED RINGERS IR SOLN
Status: DC | PRN
  Administered 2012-06-29: 3000 mL

## 2012-06-29 MED ORDER — ONDANSETRON HCL 4 MG/2ML IJ SOLN
INTRAMUSCULAR | Status: DC | PRN
  Administered 2012-06-29: 4 mg via INTRAVENOUS

## 2012-06-29 MED ORDER — HEPARIN SODIUM (PORCINE) 5000 UNIT/ML IJ SOLN
5000.0000 [IU] | Freq: Once | INTRAMUSCULAR | Status: AC
  Administered 2012-06-29: 5000 [IU] via SUBCUTANEOUS
  Filled 2012-06-29: qty 1

## 2012-06-29 MED ORDER — NEOSTIGMINE METHYLSULFATE 1 MG/ML IJ SOLN
INTRAMUSCULAR | Status: DC | PRN
  Administered 2012-06-29: 5 mg via INTRAVENOUS

## 2012-06-29 MED ORDER — LABETALOL HCL 5 MG/ML IV SOLN
10.0000 mg | Freq: Once | INTRAVENOUS | Status: AC
  Administered 2012-06-29: 10 mg via INTRAVENOUS
  Filled 2012-06-29: qty 4

## 2012-06-29 MED ORDER — HYDROMORPHONE HCL PF 1 MG/ML IJ SOLN
0.2500 mg | INTRAMUSCULAR | Status: DC | PRN
  Administered 2012-06-29 (×2): 0.5 mg via INTRAVENOUS

## 2012-06-29 MED ORDER — UNJURY VANILLA POWDER: 2.0000 [oz_av] | Freq: Four times a day (QID) | ORAL | Status: DC

## 2012-06-29 SURGICAL SUPPLY — 67 items
ADH SKN CLS APL DERMABOND .7 (GAUZE/BANDAGES/DRESSINGS) ×2
APL SRG 32X5 SNPLK LF DISP (MISCELLANEOUS) ×2
APPLICATOR COTTON TIP 6IN STRL (MISCELLANEOUS) IMPLANT
APPLIER CLIP ROT 10 11.4 M/L (STAPLE)
APR CLP MED LRG 11.4X10 (STAPLE)
BAG SPEC RTRVL LRG 6X4 10 (ENDOMECHANICALS)
CABLE HIGH FREQUENCY MONO STRZ (ELECTRODE) ×1 IMPLANT
CANISTER SUCTION 2500CC (MISCELLANEOUS) ×4 IMPLANT
CATH ROBINSON RED A/P 16FR (CATHETERS) ×2 IMPLANT
CHLORAPREP W/TINT 26ML (MISCELLANEOUS) ×8 IMPLANT
CLIP APPLIE ROT 10 11.4 M/L (STAPLE) IMPLANT
CLOTH BEACON ORANGE TIMEOUT ST (SAFETY) ×5 IMPLANT
DERMABOND ADVANCED (GAUZE/BANDAGES/DRESSINGS) ×1
DERMABOND ADVANCED .7 DNX12 (GAUZE/BANDAGES/DRESSINGS) ×2 IMPLANT
DEVICE SUTURE ENDOST 10MM (ENDOMECHANICALS) IMPLANT
DRAIN CHANNEL 19F RND (DRAIN) ×3 IMPLANT
DRAPE LAPAROSCOPIC ABDOMINAL (DRAPES) ×3 IMPLANT
DRAPE UTILITY 15X26 (DRAPE) ×6 IMPLANT
ELECT REM PT RETURN 9FT ADLT (ELECTROSURGICAL) ×3
ELECTRODE REM PT RTRN 9FT ADLT (ELECTROSURGICAL) ×2 IMPLANT
EVACUATOR DRAINAGE 10X20 100CC (DRAIN) ×2 IMPLANT
EVACUATOR SILICONE 100CC (DRAIN) ×5 IMPLANT
GLOVE BIO SURGEON STRL SZ 6.5 (GLOVE) ×6 IMPLANT
GLOVE SURG SS PI 7.5 STRL IVOR (GLOVE) ×6 IMPLANT
GOWN PREVENTION PLUS LG XLONG (DISPOSABLE) ×4 IMPLANT
GOWN STRL NON-REIN LRG LVL3 (GOWN DISPOSABLE) ×3 IMPLANT
GOWN STRL REIN XL XLG (GOWN DISPOSABLE) ×11 IMPLANT
HANDLE STAPLE EGIA 4 XL (STAPLE) ×1 IMPLANT
HOVERMATT SINGLE USE (MISCELLANEOUS) ×3 IMPLANT
KIT BASIN OR (CUSTOM PROCEDURE TRAY) ×3 IMPLANT
MARKER SKIN DUAL TIP RULER LAB (MISCELLANEOUS) ×2 IMPLANT
NDL SPNL 22GX3.5 QUINCKE BK (NEEDLE) ×2 IMPLANT
NEEDLE SPNL 22GX3.5 QUINCKE BK (NEEDLE) ×3 IMPLANT
NS IRRIG 1000ML POUR BTL (IV SOLUTION) ×3 IMPLANT
PACK LAPAROSCOPY W LONG (CUSTOM PROCEDURE TRAY) ×2 IMPLANT
PENCIL BUTTON HOLSTER BLD 10FT (ELECTRODE) ×3 IMPLANT
POUCH SPECIMEN RETRIEVAL 10MM (ENDOMECHANICALS) IMPLANT
RELOAD EGIA 60 MED/THCK PURPLE (STAPLE) ×9 IMPLANT
RELOAD STAPLE 60 BLK XTHK ART (STAPLE) IMPLANT
RELOAD STAPLE 60 MED/THCK ART (STAPLE) IMPLANT
RELOAD TRI 2.0 60 XTHK VAS SUL (STAPLE) ×6 IMPLANT
SCALPEL HARMONIC ACE (MISCELLANEOUS) ×1 IMPLANT
SCISSORS LAP 5X35 DISP (ENDOMECHANICALS) ×1 IMPLANT
SEALANT SURGICAL APPL DUAL CAN (MISCELLANEOUS) ×3 IMPLANT
SET IRRIG TUBING LAPAROSCOPIC (IRRIGATION / IRRIGATOR) ×3 IMPLANT
SHEARS CURVED HARMONIC AC 45CM (MISCELLANEOUS) ×3 IMPLANT
SLEEVE ENDOPATH XCEL 5M (ENDOMECHANICALS) ×9 IMPLANT
SOLUTION ANTI FOG 6CC (MISCELLANEOUS) ×3 IMPLANT
SPONGE GAUZE 4X4 12PLY (GAUZE/BANDAGES/DRESSINGS) IMPLANT
SPONGE LAP 18X18 X RAY DECT (DISPOSABLE) ×3 IMPLANT
SUT ETHILON 2 0 PS N (SUTURE) ×3 IMPLANT
SUT MNCRL AB 4-0 PS2 18 (SUTURE) ×6 IMPLANT
SUT VIC AB 3-0 PS2 18 (SUTURE)
SUT VIC AB 3-0 PS2 18XBRD (SUTURE) IMPLANT
SUT VICRYL 0 UR6 27IN ABS (SUTURE) ×3 IMPLANT
SYR 50ML LL SCALE MARK (SYRINGE) ×3 IMPLANT
TOWEL OR 17X24 6PK STRL BLUE (TOWEL DISPOSABLE) ×4 IMPLANT
TOWEL OR 17X26 10 PK STRL BLUE (TOWEL DISPOSABLE) ×3 IMPLANT
TRAY FOLEY CATH 14FRSI W/METER (CATHETERS) ×3 IMPLANT
TRAY LAP CHOLE (CUSTOM PROCEDURE TRAY) ×3 IMPLANT
TROCAR BLADELESS 15MM (ENDOMECHANICALS) ×3 IMPLANT
TROCAR BLADELESS OPT 5 100 (ENDOMECHANICALS) ×3 IMPLANT
TROCAR XCEL NON-BLD 11X100MML (ENDOMECHANICALS) ×3 IMPLANT
TUBING CONNECTING 10 (TUBING) ×3 IMPLANT
TUBING ENDO SMARTCAP (MISCELLANEOUS) ×3 IMPLANT
TUBING FILTER THERMOFLATOR (ELECTROSURGICAL) ×3 IMPLANT
WATER STERILE IRR 1000ML POUR (IV SOLUTION) ×3 IMPLANT

## 2012-06-29 NOTE — Op Note (Signed)
Molina L Mcmurtrey PROCEDURE DATE: 06/29/2012  PREOPERATIVE DIAGNOSIS: Undesired fertility  POSTOPERATIVE DIAGNOSIS: The same  PROCEDURE: Laparoscopic bilateral salpingoophorectomy  SURGEON:  Dr. Reva Bores  ASSISTANT: None  ANESTHESIOLOGIST:  Gaylan Gerold, MD  INDICATIONS: 42 y.o. who was undergoing laparoscopic gastric bypass and desired permanent sterility.  FINDINGS:  Small uterus, normal adnexa bilaterally  No evidence of endometriosis, adhesions or any other abdominal/pelvic abnormality.  Normal upper abdomen.  ANESTHESIA:    General  ESTIMATED BLOOD LOSS: minimal  SPECIMENS:  Bilateral fallopian tubes  COMPLICATIONS: None immediate  PROCEDURE IN DETAIL:  The patient received intravenous antibiotics and had sequential compression devices applied to her lower extremities while in the preoperative area.  She underwent another laparoscopic procedure, gastric bypass, details are noted in Dr. Delice Lesch operative note, and was already opened for this portion of the procedure.  Patient placed in Trendelenberg.   A survey of the patient's pelvis and abdomen revealed the findings above.    On the left side, the tube was identified and removed from the underlying mesosalpinx and transected with the Harmonic device. A similar procedure was done on the right. Excellent hemostasis was noted. The specimens were removed from the abdomen via the 15 mm umbilical port. The operative site was surveyed, and it was found to be hemostatic.  No intraoperative injury to other surrounding organs was noted.  Final closure performed  Dr. Biagio Quint.

## 2012-06-29 NOTE — Transfer of Care (Signed)
Immediate Anesthesia Transfer of Care Note  Patient: Kimberly [name]  Procedure(s) Performed: Procedure(s) with comments: LAPAROSCOPIC GASTRIC SLEEVE RESECTION (N/A) - Laparoscopic Sleeve Gastrectomy with EGD ESOPHAGOGASTRODUODENOSCOPY (EGD) (N/A) LAPAROSCOPIC BILATERAL SALPINGECTOMY (N/A)  Patient Location: PACU  Anesthesia Type:General  Level of Consciousness: awake, alert , oriented and patient cooperative  Airway & Oxygen Therapy: Patient Spontanous Breathing and Patient connected to nasal cannula oxygen  Post-op Assessment: Report given to PACU RN and Post -op Vital signs reviewed and stable  Post vital signs: stable  Complications: No apparent anesthesia complications

## 2012-06-29 NOTE — H&P (Signed)
Kimberly [name] is an 42 y.o. No obstetric history on file. Unknown female.   Chief Complaint: Undesired fertility HPI: 42 y.o. P1, who desires permanent sterility.  She is undergoing laparoscopic gastric bypass with general surgery today.  We will perform this procedure at the same time.  Past Medical History  Diagnosis Date  . Chronic headaches   . HTN (hypertension)   . Hx: UTI (urinary tract infection)     hx of  . Radial styloid tenosynovitis   . Irritable bowel syndrome   . GERD (gastroesophageal reflux disease)   . Arthritis   . Morbid obesity with BMI of 40.0-44.9, adult 12/13/11    BMI 40.9  . Anginal pain     hx of  . PONV (postoperative nausea and vomiting)     Past Surgical History  Procedure Laterality Date  . Breast reduction surgery    . Colonoscopy  10/18/10    normal, consistent with IBS  . Breast surgery  1996    reduct  . Cesarean section  2001    Family History  Problem Relation Age of Onset  . Ovarian cancer Paternal Grandmother   . Colitis Father   . Crohn's disease Father   . Diabetes Father   . Diverticulitis Father   . Diabetes Mother   . Diabetes Sister   . Irritable bowel syndrome Sister   . Irritable bowel syndrome Brother   . Colon cancer Paternal Uncle    Social History:  reports that she has never smoked. She has never used smokeless tobacco. She reports that  drinks alcohol. She reports that she does not use illicit drugs.  Allergies:  Allergies  Allergen Reactions  . Adhesive (Tape) Rash    Medications Prior to Admission  Medication Sig Dispense Refill  . losartan-hydrochlorothiazide (HYZAAR) 100-25 MG per tablet Take 1 tablet by mouth every morning.      . metoprolol succinate (TOPROL-XL) 25 MG 24 hr tablet Take 25 mg by mouth every morning.      . Multiple Vitamin (MULTIVITAMIN) tablet Take 1 tablet by mouth daily.        . SUMAtriptan (IMITREX) 100 MG tablet Take 100 mg by mouth every 2 (two) hours as needed for migraine. As  needed      . naproxen (NAPROSYN) 500 MG tablet Take 1 tablet (500 mg total) by mouth 2 (two) times daily with a meal.  180 tablet  3  . venlafaxine XR (EFFEXOR-XR) 75 MG 24 hr capsule Take 75 mg by mouth every morning.         Pertinent items are noted in HPI.  Blood pressure 122/80, pulse 88, temperature 98.7 F (37.1 C), temperature source Oral, resp. rate 18, height 5' (1.524 m), weight 206 lb 8 oz (93.668 kg), last menstrual period 06/15/2012, SpO2 100.00%. BP 122/80  Pulse 88  Temp(Src) 98.7 F (37.1 C) (Oral)  Resp 18  Ht 5' (1.524 m)  Wt 206 lb 8 oz (93.668 kg)  BMI 40.33 kg/m2  SpO2 100%  LMP 06/15/2012 General appearance: alert, cooperative and appears stated age Head: Normocephalic, without obvious abnormality, atraumatic Neck: supple, symmetrical, trachea midline Lungs: clear to auscultation bilaterally Heart: regular rate and rhythm, S1, S2 normal, no murmur, click, rub or gallop Abdomen: soft, non-tender; bowel sounds normal; no masses,  no organomegaly Extremities: extremities normal, atraumatic, no cyanosis or edema Skin: Skin color, texture, turgor normal. No rashes or lesions Neurologic: Grossly normal   Lab Results  Component Value Date  WBC 7.3 06/18/2012   HGB 14.0 06/18/2012   HCT 41.8 06/18/2012   MCV 94.4 06/18/2012   PLT 302 06/18/2012   Lab Results  Component Value Date   PREGTESTUR Negative 03/28/2010   PREGSERUM NEGATIVE 06/18/2012     Assessment/Plan Patient Active Problem List   Diagnosis Date Noted  . Pharyngitis 02/26/2011  . Chronic diarrhea 09/13/2010  . Migraine headaches 09/13/2010  . ANA positive 09/13/2010  . Polyarthralgia 09/13/2010  . ESSENTIAL HYPERTENSION 01/11/2009  . 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  Undesired fertility  For laparoscopic salpingectomy. Patient counseled, r.e. Risks benefits of BTL, including permanency of procedure, minimal risk of  failure. Patient verbalized understanding and desires to proceed    Rolonda Pontarelli S 06/29/2012, 9:34 AM

## 2012-06-29 NOTE — H&P (View-Only) (Signed)
Patient ID: Kimberly [name], female   DOB: 07/18/1970, 42 y.o.   MRN: [id number]  Chief Complaint  Patient presents with  . Bariatric Pre-op    gastric sleeve    HPI Kimberly [name] is a 42 y.o. female.  This patient comes in today for her preoperative surgical evaluation in preparation for microscopic vertical sleeve gastrectomy scheduled for June 24. She has a BMI of 42 with obesity related comorbidities of hypertension and depression and remains interested in a vertical sleeve gastrectomy. She says that since our last visit she has only had 3 episodes of heartburn and still only has very rare rare episodes of this. She did have some reflux on her upper GI and but otherwise normal and her laboratory studies are essentially normal. HPI  Past Medical History  Diagnosis Date  . Chronic headaches   . HTN (hypertension)   . Hx: UTI (urinary tract infection)   . Radial styloid tenosynovitis   . Irritable bowel syndrome   . GERD (gastroesophageal reflux disease)   . Arthritis   . Morbid obesity with BMI of 40.0-44.9, adult 12/13/11    BMI 40.9    Past Surgical History  Procedure Laterality Date  . Breast reduction surgery    . Colonoscopy  10/18/10    normal, consistent with IBS  . Breast surgery  1996    reduct  . Cesarean section  2001    Family History  Problem Relation Age of Onset  . Ovarian cancer Paternal Grandmother   . Colitis Father   . Crohn's disease Father   . Diabetes Father   . Diverticulitis Father   . Diabetes Mother   . Diabetes Sister   . Irritable bowel syndrome Sister   . Irritable bowel syndrome Brother   . Colon cancer Paternal Uncle     Social History History  Substance Use Topics  . Smoking status: Never Smoker   . Smokeless tobacco: Never Used  . Alcohol Use: Yes     Comment: Rare    No Known Allergies  Current Outpatient Prescriptions  Medication Sig Dispense Refill  . Etonogestrel-Ethinyl Estradiol (NUVARING VA) Place vaginally.      .  losartan-hydrochlorothiazide (HYZAAR) 100-25 MG per tablet Take 1 tablet by mouth daily.  90 tablet  3  . metoprolol succinate (TOPROL-XL) 25 MG 24 hr tablet Take 1 tablet (25 mg total) by mouth daily.  90 tablet  3  . Multiple Vitamin (MULTIVITAMIN) tablet Take 1 tablet by mouth daily.        . naproxen (NAPROSYN) 500 MG tablet Take 1 tablet (500 mg total) by mouth 2 (two) times daily with a meal.  180 tablet  3  . SUMAtriptan (IMITREX) 100 MG tablet Take 100 mg by mouth. As needed       . tizanidine (ZANAFLEX) 2 MG capsule Take 2 mg by mouth 3 (three) times daily.      . venlafaxine XR (EFFEXOR-XR) 75 MG 24 hr capsule Take 1 capsule (75 mg total) by mouth daily.  90 capsule  3   No current facility-administered medications for this visit.    Review of Systems Review of Systems All other review of systems negative or noncontributory except as stated in the HPI  Blood pressure 134/98, pulse 103, temperature 97.5 F (36.4 C), temperature source Temporal, height 5' (1.524 m), weight 214 lb (97.07 kg), SpO2 97.00%.  Physical Exam Physical Exam Physical Exam  Nursing note and vitals reviewed. Constitutional: She is oriented to   person, place, and time. She appears well-developed and well-nourished. No distress.  HENT:  Head: Normocephalic and atraumatic.  Mouth/Throat: No oropharyngeal exudate.  Eyes: Conjunctivae and EOM are normal. Pupils are equal, round, and reactive to light. Right eye exhibits no discharge. Left eye exhibits no discharge. No scleral icterus.  Neck: Normal range of motion. Neck supple. No tracheal deviation present.  Cardiovascular: Normal rate, regular rhythm, normal heart sounds and intact distal pulses.   Pulmonary/Chest: Effort normal and breath sounds normal. No stridor. No respiratory distress. She has no wheezes.  Abdominal: Soft. Bowel sounds are normal. She exhibits no distension and no mass. There is no tenderness. There is no rebound and no guarding.   Musculoskeletal: Normal range of motion. She exhibits no edema and no tenderness.  Neurological: She is alert and oriented to person, place, and time.  Skin: Skin is warm and dry. No rash noted. She is not diaphoretic. No erythema. No pallor.  Psychiatric: She has a normal mood and affect. Her behavior is normal. Judgment and thought content normal.    Data Reviewed   Assessment    Morbid obesity with a BMI of 42 and obesity related comorbidities of hypertension and depression She remains interested in vertical sleeve gastrectomy and she's scheduled for her procedure on June 24. He had a long discussion regarding the perioperative and postoperative course as well as the procedure and its risks. The risks of infection, bleeding, pain, scarring, weight regain, too little or too much weight loss, vitamin deficiencies and need for lifelong vitamin supplementation, hair loss, need for protein supplementation, leaks, stricture, reflux, food intolerance, need for reoperation and conversion to roux Y gastric bypass, need for open surgery, injury to spleen or surrounding structures, DVT's, PE, and death again discussed with the patient and the patient expressed understanding and desires to proceed with laparoscopic vertical sleeve gastrectomy, possible open, intraoperative endoscopy. I again explained the she may have an increased risk of worsening reflux and the need for lifelong medications or possibly even the need for conversion to Roux-en-Y gastric bypass and she expressed understanding and desires to continue with sleeve gastrectomy.      Plan    We will plan for vertical sleeve gastrectomy on June 24 and she will start her preoperative diet next week        Mirta Mally DAVID 06/03/2012, 3:19 PM    

## 2012-06-29 NOTE — Anesthesia Preprocedure Evaluation (Addendum)
Anesthesia Evaluation  Patient identified by MRN, date of birth, ID band Patient awake    Reviewed: Allergy & Precautions, H&P , NPO status , Patient's Chart, lab work & pertinent test results  History of Anesthesia Complications (+) PONV  Airway Mallampati: II TM Distance: >3 FB Neck ROM: Full    Dental  (+) Dental Advisory Given and Teeth Intact   Pulmonary neg pulmonary ROS,  breath sounds clear to auscultation        Cardiovascular hypertension, Pt. on home beta blockers and Pt. on medications + angina Rhythm:Regular Rate:Normal     Neuro/Psych  Headaches, negative psych ROS   GI/Hepatic Neg liver ROS, GERD-  ,  Endo/Other  Morbid obesity  Renal/GU negative Renal ROS     Musculoskeletal negative musculoskeletal ROS (+)   Abdominal (+) + obese,   Peds  Hematology negative hematology ROS (+)   Anesthesia Other Findings   Reproductive/Obstetrics negative OB ROS                         Anesthesia Physical Anesthesia Plan  ASA: III  Anesthesia Plan: General   Post-op Pain Management:    Induction: Intravenous  Airway Management Planned: Oral ETT  Additional Equipment:   Intra-op Plan:   Post-operative Plan: Extubation in OR  Informed Consent: I have reviewed the patients History and Physical, chart, labs and discussed the procedure including the risks, benefits and alternatives for the proposed anesthesia with the patient or authorized representative who has indicated his/her understanding and acceptance.   Dental advisory given  Plan Discussed with: CRNA  Anesthesia Plan Comments:         Anesthesia Quick Evaluation

## 2012-06-29 NOTE — Anesthesia Postprocedure Evaluation (Signed)
Anesthesia Post Note  Patient: Kimberly [name]  Procedure(s) Performed: Procedure(s) (LRB): LAPAROSCOPIC GASTRIC SLEEVE RESECTION (N/A) ESOPHAGOGASTRODUODENOSCOPY (EGD) (N/A) LAPAROSCOPIC BILATERAL SALPINGECTOMY (N/A)  Anesthesia type: General  Patient location: PACU  Post pain: Pain level controlled  Post assessment: Post-op Vital signs reviewed  Last Vitals: BP 142/93  Pulse 82  Temp(Src) 36.6 C (Oral)  Resp 26  Ht 5' (1.524 m)  Wt 206 lb 8 oz (93.668 kg)  BMI 40.33 kg/m2  SpO2 98%  LMP 06/15/2012  Post vital signs: Reviewed  Level of consciousness: sedated  Complications: No apparent anesthesia complications

## 2012-06-29 NOTE — Op Note (Signed)
NAMESHALAYNA, [name] NO.:  [id number]  MEDICAL RECORD NO.:  [id number]  LOCATION:  1528                         FACILITY:  [facility]  PHYSICIAN:  Lodema Pilot, MD       DATE OF BIRTH:  1970-10-28  DATE OF PROCEDURE:  06/29/2012 DATE OF DISCHARGE:                              OPERATIVE REPORT   PROCEDURE:  Laparoscopic vertical sleeve gastrectomy with intraoperative endoscopy.  PREOPERATIVE DIAGNOSIS:  Morbid obesity.  POSTOPERATIVE DIAGNOSIS:  Morbid obesity.  SURGEON:  Lodema Pilot, MD  ASSISTANT:  [name]. [name], M.D.  ANESTHESIA:  General endotracheal anesthesia.  FLUIDS:  1600 mL of crystalloid.  ESTIMATED BLOOD LOSS:  Minimal.  DRAINS:  A 19-French Blake drain placed along the staple line.  SPECIMENS:  Greater curvature of the stomach.  COMPLICATIONS:  None apparent.  FINDINGS:  U-shape stomach, otherwise normal appearing anatomy.  Sleeve gastrectomy created with 36-French bougie 35 cm from the pylorus and 19- [place] Blake drain placed along the staple lines.  INDICATION FOR PROCEDURE:  Kimberly [name] is a 42 year old female with morbid obesity and hypertension, who has failed medical weight loss attempts and desires durable weight loss solution.  OPERATIVE DETAILS:  Kimberly [name] was seen and evaluated in the preoperative area.  Risks and benefits of the procedure were again discussed in lay terms.  Informed consent was obtained.  I again discussed with her preoperatively that since she does have a history of reflux she may have had chance of needing daily medication for treatment of reflux and possibly even conversion to Roux-en-Y gastric bypass after the sleeve.  She expressed understanding and remained interested in having the sleeve gastrectomy.  She was given prophylactic antibiotics and subcu heparin and taken to the operating room, placed on table in supine position.  General endotracheal tube anesthesia was obtained. The abdomen was  prepped and draped in standard surgical fashion.  A 5-mm Optiview trocar was used to access the peritoneum in the left upper quadrant and pneumoperitoneum was obtained.  Laparoscope was introduced and there were no evidence of bleeding or bowel injury upon entry. Another 15 mm right rectus port and 5 mm right upper quadrant port were all placed under direct visualization.  [name] liver retractor was passed through the epigastrium to retract the left lobe of liver.  All the tubes were removed the abdomen.  Then, I measured out 5 cm from pylorus and began dividing the short gastric vessels along the greater curvature of the stomach using Harmonic scalpel.  The lesser sac was entered and the short gastric vessels were divided using Harmonic scalpel.  The division was carried up around the greater curve of the stomach around the spleen.  The stomach was more closely adherent to the spleen.  In this area, we were able to divide all the vessels in this area taking care to avoid injury to the stomach.  The left crus was identified and cleared off of the stomach and fat, and she did not appear to have any hiatal hernia anteroposteriorly.  The posterior gastric adhesions were divided using sharp dissection, and after the stomach was mobilized to the lesser curvature, I again measured out  5 cm from the pylorus and started to create my sleeve gastrectomy.  Using a 60 mm black Tri-Staple load, first staple firing was taken taking care to avoid narrowing the stomach at the angularis.  Second black Tri- Staple load was placed at the crotch of the prior staple load where staple fired, and prior to firing the staple loads, 36-French bougie was passed along the lesser curvature of the stomach into the antrum and pylorus region.  This was done without difficulty, and the second firing was taken.  Care was taken to avoid Christmas tree formation of the staple line.  I then transitioned to 60 mm purple  Tri-Staple loads and carried to division up towards the angle of His taking care to avoid narrowing the stomach or clamping too tightly the bougie.  Each fire was investigated anterior and posteriorly prior to the division.  The final staple load was taken to the angle of His just off the gastric fat pad. There was still stomach between the esophagus and the staple lines.  The stomach was completely transected.  There was a little bit of oozing at the very apex of the staple line and a clip was placed in this area as well as a few other places along the staple line for hemostasis.  Then, Dr. [name] performed upper endoscopy passing the endoscope through the esophagus into the stomach and the sleeve.  At this submersion of water and he insufflated the stomach, I did not see any evidence of air leakage.  The internal staple line appeared hemostatic and the sleeve was tubular and did not have any obvious kinking or stricture and the scope was easily driven through the sleeve into the antrum.  The pylorus was identified.  The air was suctioned and the scope was removed. Tisseel fibrin glue was placed along the sleeve staple lines.  The 60- [place] Blake drain was placed in the abdomen and exited through the left upper quadrant trocar site.  The drain was placed just posterior to the sleeve staple line, and the omentum was placed back over the stomach and staple lines.  Liver retractors removed.  The abdomen was noted to be hemostatic.  I enlarged the 15-mm port site and removed the resected stomach and sent to pathology for permanent sectioning.  This fascia was closed with interrupted 2-0 Vicryl sutures in open fashion.  They are not secured at this time, and the port was placed back through the incision, and at this time, Dr. [name] came in and performed her salpingectomy.  For details of her procedure, please refer to her dictated op note.  After she was done with her procedure, I  again inspected the upper abdomen.  The drain was in good position, and left stomach and left upper quadrant were hemostatic.  The sutures were secured that I placed previously to close the fascia.  There was no evidence of bleeding or bowel injury upon closure.  The final ports were removed and the wounds were irrigated with sterile saline solution. They were injected with a total of 55 mL of 1% lidocaine with epinephrine? in a 50:50 mixture.  The skin edges were approximated with 4-0 Monocryl subcuticular suture.  Skin was washed and dried and Dermabond was applied.  All sponge, needle, and instrument counts correct at the end of the case.  The patient tolerated the procedure well without apparent complications.          ______________________________ Lodema Pilot, MD     BL/MEDQ  D:  06/29/2012  T:  06/29/2012  Job:  [id number]

## 2012-06-29 NOTE — Interval H&P Note (Signed)
History and Physical Interval Note:  06/29/2012 10:16 AM  Kimberly [name]  has presented today for surgery, with the diagnosis of morbid obesity  The various methods of treatment have been discussed with the patient and family. After consideration of risks, benefits and other options for treatment, the patient has consented to  Procedure(s) with comments: LAPAROSCOPIC GASTRIC SLEEVE RESECTION (N/A) - Laparoscopic Sleeve Gastrectomy with EGD ESOPHAGOGASTRODUODENOSCOPY (EGD) (N/A) LAPAROSCOPIC BILATERAL SALPINGECTOMY (N/A) as a surgical intervention .  The patient's history has been reviewed, patient examined, no change in status, stable for surgery.  I have reviewed the patient's chart and labs.  Questions were answered to the patient's satisfaction.   She was seen and evaluated in the preop area.  Risks of the procedure again discussed in lay terms.  The risks of infection, bleeding, pain, scarring, weight regain, too little or too much weight loss, vitamin deficiencies and need for lifelong vitamin supplementation, hair loss, need for protein supplementation, leaks, stricture, reflux, food intolerance, need for reoperation and conversion to roux Y gastric bypass, need for open surgery, injury to spleen or surrounding structures, DVT's, PE, and death again discussed with the patient and the patient expressed understanding and desires to proceed with laparoscopic vertical sleeve gastrectomy, possible open, intraoperative endoscopy.  I again reviewed with her the possibility of worsening reflux since she had reflux on UGI and some mild reflux by history.  She has not been on PPI and has not had any problems.  She says that she only takes it randomly when she takes spaghetti and other precipitating foods.  She understands that this may increase requiring daily meds or conversion to RYGB.  Apparently she will be getting combined GYN procedure today as well which I was not aware of.  I think that this will be okay  and should not affect her sleeve procedure.    Lodema Pilot DAVID

## 2012-06-29 NOTE — Progress Notes (Signed)
Doing okay.  Pain controlled.  HR normal.  Seems to be doing okay.

## 2012-06-29 NOTE — Brief Op Note (Signed)
06/29/2012  12:43 PM  PATIENT:  Kimberly [name]  42 y.o. female  PRE-OPERATIVE DIAGNOSIS:  morbid obesity  POST-OPERATIVE DIAGNOSIS:  morbid obesity  PROCEDURE:  Procedure(s) with comments: LAPAROSCOPIC GASTRIC SLEEVE RESECTION (N/A) - Laparoscopic Sleeve Gastrectomy with EGD ESOPHAGOGASTRODUODENOSCOPY (EGD) (N/A) LAPAROSCOPIC BILATERAL SALPINGECTOMY (N/A)  SURGEON:  Surgeon(s) and Role: Panel 1:    * Lodema Pilot, DO - Primary    * Mariella Saa, MD - Assisting  Panel 2:    * Reva Bores, MD - Primary  PHYSICIAN ASSISTANT:   ASSISTANTS: [name]   ANESTHESIA:   general  EBL:  Total I/O In: 1000 [I.V.:1000] Out: 235 [Urine:135; Blood:100]  BLOOD ADMINISTERED:none  DRAINS: (72F) Jackson-Pratt drain(s) with closed bulb suction in the sleeve staple line   LOCAL MEDICATIONS USED:  MARCAINE    and LIDOCAINE   SPECIMEN:  Source of Specimen:  greater curvature of stomach  DISPOSITION OF SPECIMEN:  PATHOLOGY  COUNTS:  YES  TOURNIQUET:  * No tourniquets in log *  DICTATION: .Other Dictation: Dictation Number [id number]  PLAN OF CARE: Admit to inpatient   PATIENT DISPOSITION:  PACU - hemodynamically stable.   Delay start of Pharmacological VTE agent (>24hrs) due to surgical blood loss or risk of bleeding: no

## 2012-06-29 NOTE — Op Note (Signed)
Procedure: Upper GI endoscopy  Description of procedure: Upper GI endoscopy is performed at the completion of laparoscopic sleeve gastrectomy by Dr. Biagio Quint. The video endoscope was introduced into the upper esophagus and then passed to the EG junction at about 40 cm. The esophagus appeared normal. The gastric sleeve was entered. The sleeve appeared to do her. The staple line was intact and without bleeding. The scope was advanced to the antrum and pylorus visualized. There was no stricture or mucosal abnormality. The pouch was distended with air under saline irrigation and there was no evidence of leak. The pouch was then desufflated and the scope withdrawn.  Mariella Saa MD, FACS  06/29/2012, 12:16 PM

## 2012-06-30 ENCOUNTER — Encounter (HOSPITAL_COMMUNITY): Payer: Self-pay | Admitting: General Surgery

## 2012-06-30 LAB — COMPREHENSIVE METABOLIC PANEL
Albumin: 3.1 g/dL — ABNORMAL LOW (ref 3.5–5.2)
Alkaline Phosphatase: 97 U/L (ref 39–117)
BUN: 5 mg/dL — ABNORMAL LOW (ref 6–23)
Calcium: 8.4 mg/dL (ref 8.4–10.5)
Potassium: 3.7 mEq/L (ref 3.5–5.1)
Sodium: 132 mEq/L — ABNORMAL LOW (ref 135–145)
Total Protein: 6.8 g/dL (ref 6.0–8.3)

## 2012-06-30 LAB — CBC WITH DIFFERENTIAL/PLATELET
Basophils Relative: 0 % (ref 0–1)
Eosinophils Absolute: 0 10*3/uL (ref 0.0–0.7)
Eosinophils Relative: 0 % (ref 0–5)
MCH: 30.5 pg (ref 26.0–34.0)
MCHC: 32.5 g/dL (ref 30.0–36.0)
Monocytes Relative: 7 % (ref 3–12)
Neutrophils Relative %: 80 % — ABNORMAL HIGH (ref 43–77)
Platelets: 249 10*3/uL (ref 150–400)

## 2012-06-30 MED ORDER — KETOROLAC TROMETHAMINE 30 MG/ML IJ SOLN
30.0000 mg | Freq: Four times a day (QID) | INTRAMUSCULAR | Status: DC | PRN
  Administered 2012-06-30 – 2012-07-01 (×2): 30 mg via INTRAVENOUS
  Filled 2012-06-30 (×2): qty 1

## 2012-06-30 MED ORDER — LABETALOL HCL 5 MG/ML IV SOLN
10.0000 mg | INTRAVENOUS | Status: DC | PRN
  Filled 2012-06-30: qty 4

## 2012-06-30 MED ORDER — KCL IN DEXTROSE-NACL 20-5-0.9 MEQ/L-%-% IV SOLN
INTRAVENOUS | Status: DC
  Administered 2012-06-30 – 2012-07-01 (×3): via INTRAVENOUS
  Filled 2012-06-30 (×5): qty 1000

## 2012-06-30 MED ORDER — METOPROLOL TARTRATE 25 MG/10 ML ORAL SUSPENSION
25.0000 mg | Freq: Two times a day (BID) | ORAL | Status: DC
  Administered 2012-06-30 (×2): 25 mg via ORAL
  Filled 2012-06-30 (×4): qty 10

## 2012-06-30 NOTE — Progress Notes (Signed)
She looks and feels well.  Minimal discomfort and tolerating liquids.  HR and vitals normal.

## 2012-06-30 NOTE — Progress Notes (Signed)
Met with Mrs Tholl to explain Link to Wellness program. She reports that she does not feel as though she has any Link to Wellness needs at this time after program benefits explained. Left contact information for her to call in future if needed. Raiford Noble, MSN-Ed, RN, BSN- [facility] [phone]

## 2012-06-30 NOTE — Progress Notes (Signed)
Pt alert and oriented. Feeling better this am. Nausea resolving. Provided copy of bariatric discharge instructions for patient to review (see below). Will follow-up with patient later to review instructions with her. Pt has follow-up appointments with Dr. Biagio Quint and nutrition in place.  BYPASS / SLEEVE  Home Care Instructions  These instructions are to help you care for yourself when you go home.  Call: If you have any problems.   Call [phone] and ask for the surgeon on call   If you need immediate assistance come to the ER at [facility]. Tell the ER staff that you are a new post-op gastric bypass or gastric sleeve patient   Signs and symptoms to report:   Severe vomiting or nausea o If you cannot handle clear liquids for longer than 1 day, call your surgeon    Abdominal pain which does not get better after taking your pain medication   Fever greater than 100.4 F and chills   Heart rate over 100 beats a minute   Trouble breathing   Chest pain    Redness, swelling, drainage, or foul odor at incision (surgical) sites    If your incisions open or pull apart   Swelling or pain in calf (lower leg)   Diarrhea (Loose bowel movements that happen often), frequent watery, uncontrolled bowel movements   Constipation, (no bowel movements for 3 days) if this happens:  o Take Milk of Magnesia, 2 tablespoons by mouth, 3 times a day for 2 days if needed o Stop taking Milk of Magnesia once you have had a bowel movement o Call your doctor if constipation continues Or o Take Miralax  (instead of Milk of Magnesia) following the label instructions o Stop taking Miralax once you have had a bowel movement o Call your doctor if constipation continues   Anything you think is "abnormal for you"   Normal side effects after surgery:   Unable to sleep at night or unable to concentrate   Irritability   Being tearful (crying) or depressed These are common complaints, possibly related to your anesthesia,  stress of surgery and change in lifestyle, that usually go away a few weeks after surgery.  If these feelings continue, call your medical doctor.  Wound Care: You may have surgical glue, steri-strips, or staples over your incisions after surgery   Surgical glue:  Looks like a clear film over your incisions and will wear off a little at a time   Steri-strips : Adhesive strips of tape over your incisions. You may notice a yellowish color on the skin under the steri-strips. This is used to make the   steri-strips stick better. Do not pull the steri-strips off - let them fall off   Staples: Staples may be removed before you leave the hospital o If you go home with staples, call [facility] at for an appointment with your surgeon's nurse to have staples removed 10 days after surgery, [phone]   Showering: You may shower two (2) days after your surgery unless your surgeon tells you differently o Wash gently around incisions with warm soapy water, rinse well, and gently pat dry  o If you have a drain (tube from your incision), you may need someone to hold this while you shower  o No tub baths until staples are removed and incisions are healed     Medications:   Medications should be liquid or crushed if larger than the size of a dime   Extended release pills (  medication that releases a little bit at a time through the day) should not be crushed   Depending on the size and number of medications you take, you may need to space (take a few throughout the day)/change the time you take your medications so that you do not over-fill your pouch (smaller stomach)   Make sure you follow-up with your primary care physician to make medication changes needed during rapid weight loss and life-style changes   If you have diabetes, follow up with the doctor that orders your diabetes medication(s) within one week after surgery and check your blood sugar regularly.   Do not drive while taking narcotics  (pain medications)   Do not take acetaminophen (Tylenol) and Roxicet or Lortab Elixir at the same time since these pain medications contain acetaminophen  Diet:                    First 2 Weeks  You will see the nutritionist about two (2) weeks after your surgery. The nutritionist will increase the types of foods you can eat if you are handling liquids well:   If you have severe vomiting or nausea and cannot handle clear liquids lasting longer than 1 day, call your surgeon  Protein Shake   Drink at least 2 ounces of shake 5-6 times per day   Each serving of protein shakes (usually 8 - 12 ounces) should have a minimum of:  o 15 grams of protein  o And no more than 5 grams of carbohydrate    Goal for protein each day: o Men = 80 grams per day o Women = 60 grams per day   Protein powder may be added to fluids such as non-fat milk or Lactaid milk or Soy milk (limit to 35 grams added protein powder per serving)  Hydration   Slowly increase the amount of water and other clear liquids as tolerated (See Acceptable Fluids)   Slowly increase the amount of protein shake as tolerated     Sip fluids slowly and throughout the day   May use sugar substitutes in small amounts (no more than 6 - 8 packets per day; i.e. Splenda)  Fluid Goal   The first goal is to drink at least 8 ounces of protein shake/drink per day (or as directed by the nutritionist); some examples of protein shakes are [employer], Dillard's, EAS Edge HP, and Unjury. See handout from pre-op Bariatric Education Class: o Slowly increase the amount of protein shake you drink as tolerated o You may find it easier to slowly sip shakes throughout the day o It is important to get your proteins in first   Your fluid goal is to drink 64 - 100 ounces of fluid daily o It may take a few weeks to build up to this   32 oz (or more) should be clear liquids  And    32 oz (or more) should be full liquids (see below for examples)   Liquids  should not contain sugar, caffeine, or carbonation  Clear Liquids:   Water or Sugar-free flavored water (i.e. Fruit H2O, Propel)   Decaffeinated coffee or tea (sugar-free)   Crystal Lite, Wyler's Lite, Minute Maid Lite   Sugar-free Jell-O   Bouillon or broth   Sugar-free Popsicle:   *Less than 20 calories each; Limit 1 per day  Full Liquids: Protein Shakes/Drinks + 2 choices per day of other full liquids   Full liquids must be: o No More Than  12 grams of Carbs per serving  o No More Than 3 grams of Fat per serving   Strained low-fat cream soup   Non-Fat milk   Fat-free Lactaid Milk   Sugar-free yogurt (Dannon Lite & Fit, Greek yogurt)      Vitamins and Minerals   Start 1 day after surgery unless otherwise directed by your surgeon   2 Chewable Multivitamin / Multimineral Supplement with iron (i.e. Centrum for Adults)   Vitamin B-12, 350 - 500 micrograms sub-lingual (place tablet under the tongue) each day   Chewable Calcium Citrate with Vitamin D-3 (Example: 3 Chewable Calcium Plus 600 with Vitamin D-3) o Take 500 mg three (3) times a day for a total of 1500 mg each day o Do not take all 3 doses of calcium at one time as it may cause constipation, and you can only absorb 500 mg  at a time  o Do not mix multivitamins containing iron with calcium supplements; take 2 hours apart o Do not substitute Tums (calcium carbonate) for your calcium   Menstruating women and those at risk for anemia (a blood disease that causes weakness) may need extra iron o Talk with your doctor to see if you need more iron   If you need extra iron: Total daily Iron recommendation (including Vitamins) is 50 to 100 mg Iron/day   Do not stop taking or change any vitamins or minerals until you talk to your nutritionist or surgeon   Your nutritionist and/or surgeon must approve all vitamin and mineral supplements   Activity and Exercise: It is important to continue walking at home.  Limit your physical activity  as instructed by your doctor.  During this time, use these guidelines:   Do not lift anything greater than ten (10) pounds for at least two (2) weeks   Do not go back to work or drive until Designer, industrial/product says you can   You may have sex when you feel comfortable  o It is VERY important for female patients to use a reliable birth control method; fertility often increases after surgery  o Do not get pregnant for at least 18 months   Start exercising as soon as your doctor tells you that you can o Make sure your doctor approves any physical activity   Start with a simple walking program   Walk 5-15 minutes each day, 7 days per week.    Slowly increase until you are walking 30-45 minutes per day Consider joining our BELT program. [phone] or email [email or web address]   Special Instructions Things to remember:   Free counseling is available for you and your family through collaboration between [facility] and [place]. Please call [phone] and leave a message   Use your CPAP when sleeping if this applies to you   [facility] has a free Bariatric Surgery Support Group that meets monthly, the 3rd Thursday, 6 pm, [facility] Classrooms You can see classes online at [email or web address]   It is very important to keep all follow up appointments with your surgeon, nutritionist, primary care physician, and behavioral health practitioner o After the first year, please follow up with your bariatric surgeon and nutritionist at least once a year in order to maintain best weight loss results [facility]: [phone] [facility]: [phone] Bariatric Nurse Coordinator: [phone]

## 2012-06-30 NOTE — Care Management Note (Signed)
    Page 1 of 1   06/30/2012     11:14:23 AM   CARE MANAGEMENT NOTE 06/30/2012  Patient:  Kimberly [name], Kimberly [name]   Account Number:  [id number]  Date Initiated:  06/30/2012  Documentation initiated by:  Lorenda Ishihara  Subjective/Objective Assessment:   42 yo female admitted s/p sleeve gastrectomy. PTA lived at home with spouse.     Action/Plan:   Home with spouse.   Anticipated DC Date:  07/02/2012   Anticipated DC Plan:  HOME/SELF CARE      DC Planning Services  CM consult      Choice offered to / List presented to:             Status of service:  Completed, signed off Medicare Important Message given?   (If response is "NO", the following Medicare IM given date fields will be blank) Date Medicare IM given:   Date Additional Medicare IM given:    Discharge Disposition:  HOME/SELF CARE  Per UR Regulation:  Reviewed for med. necessity/level of care/duration of stay  If discussed at Long Length of Stay Meetings, dates discussed:    Comments:

## 2012-06-30 NOTE — Progress Notes (Signed)
1 Day Post-Op  Subjective: Had some nausea last night associated with morphine.  Otherwise no complaints  Objective: Vital signs in last 24 hours: Temp:  [97.6 F (36.4 C)-98.8 F (37.1 C)] 97.9 F (36.6 C) (06/25 0536) Pulse Rate:  [71-100] 91 (06/25 0536) Resp:  [15-31] 18 (06/25 0536) BP: (134-165)/(81-103) 146/92 mmHg (06/25 0536) SpO2:  [93 %-100 %] 93 % (06/25 0536) Weight:  [206 lb 8 oz (93.668 kg)] 206 lb 8 oz (93.668 kg) (06/24 0908) Last BM Date: 06/29/12  Intake/Output from previous day: 06/24 0701 - 06/25 0700 In: 4020.8 [I.V.:4020.8] Out: 1370 [Urine:1160; Drains:110; Blood:100] Intake/Output this shift:    General appearance: alert, cooperative and no distress Resp: nonlabored Cardio: normal rate GI: soft, mild incisional tenderness, ND, wounds okay, no peritoneal signs, JP ss Extremities: SCD's bilat  Lab Results:   Recent Labs  06/30/12 0355  WBC 11.5*  HGB 12.4  HCT 38.1  PLT 249   BMET  Recent Labs  06/30/12 0355  NA 132*  K 3.7  CL 97  CO2 28  GLUCOSE 150*  BUN 5*  CREATININE 0.46*  CALCIUM 8.4   PT/INR No results found for this basename: LABPROT, INR,  in the last 72 hours ABG No results found for this basename: PHART, PCO2, PO2, HCO3,  in the last 72 hours  Studies/Results: No results found.  Anti-infectives: Anti-infectives   Start     Dose/Rate Route Frequency Ordered Stop   06/29/12 0900  ertapenem (INVANZ) 1 g in sodium chloride 0.9 % 50 mL IVPB     1 g 100 mL/hr over 30 Minutes Intravenous On call to O.R. 06/29/12 0846 06/29/12 1051      Assessment/Plan: s/p Procedure(s) with comments: LAPAROSCOPIC GASTRIC SLEEVE RESECTION (N/A) - Laparoscopic Sleeve Gastrectomy with EGD ESOPHAGOGASTRODUODENOSCOPY (EGD) (N/A) LAPAROSCOPIC BILATERAL SALPINGECTOMY (N/A) she looks okay this am.  some nausea so we will be slow to advance.  will trial sips of water and see how she does with this.  toradol instead of moprhine.  ambulate  LOS: 1 day    Lodema Pilot DAVID 06/30/2012

## 2012-07-01 MED ORDER — METOPROLOL TARTRATE 25 MG/10 ML ORAL SUSPENSION: 25.0000 mg | Freq: Two times a day (BID) | ORAL | Status: DC

## 2012-07-01 MED ORDER — OXYCODONE-ACETAMINOPHEN 5-325 MG/5ML PO SOLN: 5.0000 mL | ORAL | Status: DC | PRN

## 2012-07-01 MED ORDER — ONDANSETRON 4 MG PO TBDP: 4.0000 mg | ORAL_TABLET | Freq: Three times a day (TID) | ORAL | Status: DC | PRN

## 2012-07-01 NOTE — Progress Notes (Signed)
Patient alert and oriented. Mom at bedside. Reviewed bariatric discharge instructions using Teach Back method. Patient verbalized understanding.

## 2012-07-01 NOTE — Progress Notes (Signed)
2 Days Post-Op  Subjective: Minimal discomfort.  Tolerating liquids. No nausea  Objective: Vital signs in last 24 hours: Temp:  [97.9 F (36.6 C)-99 F (37.2 C)] 98.5 F (36.9 C) (06/26 0555) Pulse Rate:  [73-96] 92 (06/26 0555) Resp:  [18-20] 18 (06/26 0555) BP: (129-145)/(86-94) 129/89 mmHg (06/26 0555) SpO2:  [97 %-100 %] 100 % (06/26 0555) Last BM Date: 06/29/12  Intake/Output from previous day: 06/25 0701 - 06/26 0700 In: 3342.9 [P.O.:420; I.V.:2922.9] Out: 2550 [Urine:2450; Drains:100] Intake/Output this shift:    General appearance: alert, cooperative and no distress Resp: nonlabored Cardio: HR normal, regular GI: soft, no significant tenderness, wounds without infection, JP ss, no peritoneal signs  Lab Results:   Recent Labs  06/30/12 0355  WBC 11.5*  HGB 12.4  HCT 38.1  PLT 249   BMET  Recent Labs  06/30/12 0355  NA 132*  K 3.7  CL 97  CO2 28  GLUCOSE 150*  BUN 5*  CREATININE 0.46*  CALCIUM 8.4   PT/INR No results found for this basename: LABPROT, INR,  in the last 72 hours ABG No results found for this basename: PHART, PCO2, PO2, HCO3,  in the last 72 hours  Studies/Results: No results found.  Anti-infectives: Anti-infectives   Start     Dose/Rate Route Frequency Ordered Stop   06/29/12 0900  ertapenem (INVANZ) 1 g in sodium chloride 0.9 % 50 mL IVPB     1 g 100 mL/hr over 30 Minutes Intravenous On call to O.R. 06/29/12 0846 06/29/12 1051      Assessment/Plan: s/p Procedure(s) with comments: LAPAROSCOPIC GASTRIC SLEEVE RESECTION (N/A) - Laparoscopic Sleeve Gastrectomy with EGD ESOPHAGOGASTRODUODENOSCOPY (EGD) (N/A) LAPAROSCOPIC BILATERAL SALPINGECTOMY (N/A) Discharge she looks fine and should be okay for discharge to home  LOS: 2 days    Lodema Pilot DAVID 07/01/2012

## 2012-07-01 NOTE — Progress Notes (Signed)
Discharge instructions and prescriptions given.  No questions asked, verbalized understanding.  Left via wheelchair with mother.  Kimberly [name]

## 2012-07-01 NOTE — Discharge Summary (Signed)
  Physician Discharge Summary  Patient ID: Kimberly [name] MRN: [id number] DOB/AGE: Jan 23, 1970 42 y.o.  Admit date: 06/29/2012 Discharge date: 07/01/2012  Admission Diagnoses: obesity  Discharge Diagnoses: obesity Active Problems:   Sterilization   Discharged Condition: stable  Hospital Course: to OR 06/29/12 for lap sleeve gastrectomy.  Diet advanced on POD 1.  Pain controlled and ambulatory.  She was doing well and stable for discharge on POD 2  Consults: None  Significant Diagnostic Studies: none  Treatments: surgery: lap sleeve gastrectomy 06/29/12  Disposition: Final discharge disposition not confirmed  Discharge Orders   Future Appointments Provider Department Dept Phone   07/13/2012 4:00 PM [facility] Post-Op Class [facility] [phone]   07/16/2012 9:45 AM Lodema Pilot, DO [facility], [place] [phone]   Future Orders Complete By Expires     Call MD for:  difficulty breathing, headache or visual disturbances  As directed     Call MD for:  persistant nausea and vomiting  As directed     Call MD for:  redness, tenderness, or signs of infection (pain, swelling, redness, odor or green/yellow discharge around incision site)  As directed     Call MD for:  severe uncontrolled pain  As directed     Call MD for:  temperature >100.4  As directed     Discharge instructions  As directed     Comments:      May shower tomorrow.   Call [phone] for follow up appointment in about 3 weeks. May start full liquid diet tomorrow x1 week, then pureed diet x1 week, then soft diet x1 week, then advance to high protein, low fat, low carb diet as tolerated. Call your primary doctor to schedule follow up appointment for management of your routine medications.    Increase activity slowly  As directed         Medication List    STOP taking these medications       metoprolol succinate 25 MG 24 hr tablet  Commonly known as:  TOPROL-XL     naproxen 500 MG tablet  Commonly known as:  NAPROSYN      TAKE these medications       losartan-hydrochlorothiazide 100-25 MG per tablet  Commonly known as:  HYZAAR  Take 1 tablet by mouth every morning.     metoprolol tartrate 25 mg/10 mL Susp  Commonly known as:  LOPRESSOR  Take 10 mLs (25 mg total) by mouth 2 (two) times daily.     multivitamin tablet  Take 1 tablet by mouth daily.     ondansetron 4 MG disintegrating tablet  Commonly known as:  ZOFRAN ODT  Take 1 tablet (4 mg total) by mouth every 8 (eight) hours as needed for nausea.     oxyCODONE-acetaminophen 5-325 MG/5ML solution  Commonly known as:  ROXICET  Take 5-10 mLs by mouth every 4 (four) hours as needed.     SUMAtriptan 100 MG tablet  Commonly known as:  IMITREX  Take 100 mg by mouth every 2 (two) hours as needed for migraine. As needed     venlafaxine XR 75 MG 24 hr capsule  Commonly known as:  EFFEXOR-XR  Take 75 mg by mouth every morning.         SignedLodema Pilot DAVID 07/01/2012, 8:42 AM

## 2012-07-08 ENCOUNTER — Encounter: Payer: 59 | Attending: General Surgery | Admitting: *Deleted

## 2012-07-08 DIAGNOSIS — Z713 Dietary counseling and surveillance: Secondary | ICD-10-CM | POA: Insufficient documentation

## 2012-07-13 ENCOUNTER — Ambulatory Visit: Payer: 59

## 2012-07-16 ENCOUNTER — Encounter (INDEPENDENT_AMBULATORY_CARE_PROVIDER_SITE_OTHER): Payer: Self-pay | Admitting: General Surgery

## 2012-07-16 ENCOUNTER — Ambulatory Visit (INDEPENDENT_AMBULATORY_CARE_PROVIDER_SITE_OTHER): Payer: Commercial Managed Care - PPO | Admitting: General Surgery

## 2012-07-16 VITALS — BP 132/80 | HR 82 | Temp 97.2°F | Resp 16 | Ht 60.0 in | Wt 198.8 lb

## 2012-07-16 DIAGNOSIS — Z4889 Encounter for other specified surgical aftercare: Secondary | ICD-10-CM

## 2012-07-16 DIAGNOSIS — Z09 Encounter for follow-up examination after completed treatment for conditions other than malignant neoplasm: Secondary | ICD-10-CM

## 2012-07-16 DIAGNOSIS — K912 Postsurgical malabsorption, not elsewhere classified: Secondary | ICD-10-CM

## 2012-07-16 DIAGNOSIS — Z5189 Encounter for other specified aftercare: Secondary | ICD-10-CM

## 2012-07-16 NOTE — Addendum Note (Signed)
Addended by: Maryan Puls on: 07/16/2012 10:33 AM   Modules accepted: Orders

## 2012-07-16 NOTE — Progress Notes (Signed)
Subjective:     Patient ID: Kimberly [name], female   DOB: 12/03/1970, 42 y.o.   MRN: [id number]  HPI This patient follows up 3 weeks status post microscopic vertical sleeve gastrectomy for obesity. Doing well without complaints.  No food intolerance.  No vomiting.  She is taking her vitamins and protein and staying hydrated.  Walking 30 min/day.  No abdominal pain.  Rare hunger.  She has lost about 15lbs since surgery.  Her path was benign.  Review of Systems     Objective:   Physical Exam NAD, nontoxic Abdomen soft, NT, ND, incisions okay     Assessment:     S/p lap sleeve gastrectomy- doing well She is doing very well.  She feels "better than expected".  Path benign. No evidence of postop complications.  She feels very well and I really have nothing else for her. Recommended that she continue to focus on healthy food choices and gradually increase her physical activities to maximize her weight loss    Plan:     Continue with low calorie high-protein diet and increase physical activities and I will see her back in 2 months for repeat evaluation and nutrition labs.

## 2012-08-02 ENCOUNTER — Telehealth (INDEPENDENT_AMBULATORY_CARE_PROVIDER_SITE_OTHER): Payer: Self-pay | Admitting: *Deleted

## 2012-08-02 ENCOUNTER — Encounter (INDEPENDENT_AMBULATORY_CARE_PROVIDER_SITE_OTHER): Payer: Self-pay | Admitting: *Deleted

## 2012-08-02 NOTE — Telephone Encounter (Signed)
Patient called to request a return to work note be faxed to her work [phone].  Patient states she returned to work last Tuesday 07/27/12.  Faxed letter at this time.

## 2012-08-05 ENCOUNTER — Encounter: Payer: Self-pay | Admitting: *Deleted

## 2012-08-15 NOTE — Progress Notes (Signed)
Bariatric Class:  Appt start time: 1600 end time:  1700.  2 Week Post-Operative Nutrition Class  Patient was seen on 07/08/12 for Post-Operative Nutrition [facility].   Surgery date: 06/29/12  Surgery type: SLEEVE  Start weight at [facility]: 209.5 lbs (12/12/12) Goal weight: 130 lbs   Weight today: 201.0 lbs  Weight change: 13.5 lb LOSS  Total weight loss: 8.5 lbs  TANITA BODY COMP RESULTS   12/13/11  06/07/12   BMI (kg/m^2)  40.9  41.9   Fat Mass (lbs)  104.0  102.5   Fat Free Mass (lbs)  105.5  112.0   Total Body Water (lbs)  77.0  82.0    The following the learning objectives were met by the patient during this course:  Identifies Phase 3A (Soft, High Proteins) Dietary Goals and will begin from 2 weeks post-operatively to 2 months post-operatively  Identifies appropriate sources of fluids and proteins   States protein recommendations and appropriate sources post-operatively  Identifies the need for appropriate texture modifications, mastication, and bite sizes when consuming solids  Identifies appropriate multivitamin and calcium sources post-operatively  Describes the need for physical activity post-operatively and will follow MD recommendations  States when to call healthcare provider regarding medication questions or post-operative complications  Handouts given during class include:  Phase 3A: Soft, High Protein Diet Handout  Follow-Up Plan: Patient will follow-up at [facility] in 6 weeks for 2 months post-op nutrition visit for diet advancement per MD.

## 2012-08-15 NOTE — Patient Instructions (Addendum)
Patient to follow Phase 3A-Soft, High Protein Diet and follow-up at [facility] in 6 weeks for 2 months post-op nutrition visit for diet advancement. 

## 2012-08-16 ENCOUNTER — Encounter: Payer: Self-pay | Admitting: *Deleted

## 2012-08-16 ENCOUNTER — Encounter: Payer: 59 | Attending: General Surgery | Admitting: *Deleted

## 2012-08-16 VITALS — Ht 60.0 in | Wt 187.0 lb

## 2012-08-16 DIAGNOSIS — E669 Obesity, unspecified: Secondary | ICD-10-CM

## 2012-08-16 DIAGNOSIS — Z713 Dietary counseling and surveillance: Secondary | ICD-10-CM | POA: Insufficient documentation

## 2012-08-16 NOTE — Progress Notes (Signed)
  Follow-up visit:  6 Weeks Post-Operative Sleeve Gastrectomy Surgery  Medical Nutrition Therapy:  Appt start time: 1530   End time: 1600.  Primary concerns today: Post-operative Bariatric Surgery Nutrition Management.  Surgery date: 06/29/12  Surgery type: SLEEVE  Start weight at [facility]: 209.5 lbs (12/12/12) Last weight at [facility]: 214.5 lbs (Pre-Op Class 06/07/12) Goal weight: 130 lbs   Weight today: 187.0 lbs  Weight change: 14.0 lb LOSS  Total weight loss: 27.5 lbs  TANITA BODY COMP RESULTS   12/13/11  06/07/12  07/08/12 08/16/12  BMI (kg/m^2)  40.9  41.9  39.3 36.5  Fat Mass (lbs)  104.0  102.5  97.0 82.0  Fat Free Mass (lbs)  105.5  112.0  104.0 105.0  Total Body Water (lbs)  77.0  82.0  76.0 77.0   24-hr recall: B (7:30 AM): Pure Protein w/ skim lactaid (8 oz) - 33g Snk (AM): NONE or string cheese (0-6g) L (12 PM): 5.3 oz Dannon L&F greek yogurt or leftovers (13-15g) Snk (PM): String cheese (6g) D (7 PM): 2 oz chicken or pork, green beans (15g) Snk (PM): NONE    Fluid intake: 8 oz protein shake, vitamin water (20 oz),  Estimated total protein intake: 65-75g  Using straws: No Drinking while eating: No Hair loss: No Carbonated beverages: No N/V/D/C: Vomited 1 tsp of corn beef in egg; dumping-like symptoms from baked chicken, though just a one-time incident  Dumping syndrome:  None  Recent physical activity:  Walking 3x/week @ 20-30 min; swims laps on 1 x/week; 60 min  Progress Towards Goal(s):  In progress.  Handouts given during visit include:  Phase 3B: High Protein + Non-Starchy Vegetables   Nutritional Diagnosis:  [place]-3.3 Overweight/obesity related to past poor dietary habits and physical inactivity as evidenced by patient w/ recent sleeve gastrectomy surgery following dietary guidelines for continued weight loss.    Intervention:  Nutrition education/diet advancement.  Monitoring/Evaluation:  Dietary intake, exercise, and body weight. Follow up in 6 weeks for 3 month  post-op visit.

## 2012-08-16 NOTE — Patient Instructions (Addendum)
Goals:  Follow Phase 3B: High Protein + Non-Starchy Vegetables  Eat 3-6 small meals/snacks, every 3-5 hrs  Increase lean protein foods to meet 60-80g goal  Increase fluid intake to 64oz +  Avoid drinking 15 minutes before, during and 30 minutes after eating  Aim for >30 min of physical activity daily 

## 2012-09-13 ENCOUNTER — Other Ambulatory Visit: Payer: 59 | Admitting: *Deleted

## 2012-09-13 ENCOUNTER — Other Ambulatory Visit (INDEPENDENT_AMBULATORY_CARE_PROVIDER_SITE_OTHER): Payer: Self-pay | Admitting: General Surgery

## 2012-09-14 LAB — CBC WITH DIFFERENTIAL/PLATELET
Eosinophils Relative: 1 % (ref 0–5)
HCT: 43.8 % (ref 36.0–46.0)
Lymphocytes Relative: 31 % (ref 12–46)
Lymphs Abs: 1.7 10*3/uL (ref 0.7–4.0)
MCV: 94.4 fL (ref 78.0–100.0)
Monocytes Absolute: 0.4 10*3/uL (ref 0.1–1.0)
Neutro Abs: 3.4 10*3/uL (ref 1.7–7.7)
Platelets: 274 10*3/uL (ref 150–400)
RBC: 4.64 MIL/uL (ref 3.87–5.11)
WBC: 5.5 10*3/uL (ref 4.0–10.5)

## 2012-09-14 LAB — COMPREHENSIVE METABOLIC PANEL
ALT: 21 U/L (ref 0–35)
Albumin: 4 g/dL (ref 3.5–5.2)
CO2: 29 mEq/L (ref 19–32)
Calcium: 9.4 mg/dL (ref 8.4–10.5)
Chloride: 102 mEq/L (ref 96–112)
Creat: 0.48 mg/dL — ABNORMAL LOW (ref 0.50–1.10)
Potassium: 4 mEq/L (ref 3.5–5.3)

## 2012-09-14 LAB — VITAMIN B1

## 2012-09-14 LAB — FOLATE: Folate: 16.5 ng/mL

## 2012-09-14 LAB — IRON AND TIBC: TIBC: 296 ug/dL (ref 250–470)

## 2012-09-17 ENCOUNTER — Encounter (INDEPENDENT_AMBULATORY_CARE_PROVIDER_SITE_OTHER): Payer: Self-pay | Admitting: General Surgery

## 2012-09-17 ENCOUNTER — Ambulatory Visit (INDEPENDENT_AMBULATORY_CARE_PROVIDER_SITE_OTHER): Payer: Commercial Managed Care - PPO | Admitting: General Surgery

## 2012-09-17 VITALS — BP 120/84 | HR 73 | Temp 97.0°F | Resp 16 | Ht 60.0 in | Wt 178.0 lb

## 2012-09-17 DIAGNOSIS — K912 Postsurgical malabsorption, not elsewhere classified: Secondary | ICD-10-CM

## 2012-09-17 LAB — VITAMIN D 1,25 DIHYDROXY
Vitamin D 1, 25 (OH)2 Total: 79 pg/mL — ABNORMAL HIGH (ref 18–72)
Vitamin D2 1, 25 (OH)2: <8 pg/mL
Vitamin D3 1, 25 (OH)2: 79 pg/mL

## 2012-09-17 NOTE — Progress Notes (Signed)
Subjective:     Patient ID: Kimberly [name], female   DOB: February 06, 1970, 42 y.o.   MRN: [id number]  HPI This patient follows up 3 months status post vertical sleeve gastrectomy. She feels as though she is doing very well. She has no double pain or nausea or vomiting or food intolerance. She has lost about 36 pounds since her surgery and is satisfied with her weight loss. She is doing water aerobics 2 times a week and walking 2-3 miles on the other days. She has come off of her blood pressure medication and feels well. She is taking her vitamins and protein supplements and denies any numbness or tingling or weakness. She does have some constipation.  Review of Systems     Objective:   Physical Exam No acute distress and nontoxic-appearing sitting comfortably in the chair Her abdomen is soft and nontender exam incisions are well-healed. There is no evidence of neurologic deficits    Assessment:      status post vertical sleeve gastrectomy-doing well  She is doing very well from her procedure and has no evidence of any postoperative complications. I recommended that she continue with her high-protein low fat low carbohydrate diet in increase her physical activity a little more as tolerated. It sounds as though she is doing everything appropriately there is nothing else to add for her. There is no evidence of any postoperative complications in her recent vitamin labs looked normal.    Plan:     Increase physical activity and continuous bariatric diet I will see her back in about 3 months for repeat evaluation

## 2012-09-27 ENCOUNTER — Encounter: Payer: 59 | Attending: General Surgery | Admitting: *Deleted

## 2012-09-27 ENCOUNTER — Encounter: Payer: Self-pay | Admitting: *Deleted

## 2012-09-27 DIAGNOSIS — Z713 Dietary counseling and surveillance: Secondary | ICD-10-CM | POA: Insufficient documentation

## 2012-09-27 NOTE — Progress Notes (Signed)
  Follow-up visit:  12 Weeks Post-Operative Sleeve Gastrectomy Surgery  Medical Nutrition Therapy:  Appt start time: 1530   End time: 1600.  Primary concerns today: Post-operative Bariatric Surgery Nutrition Management.  Surgery date: 06/29/12  Surgery type: SLEEVE  Start weight at [facility]: 209.5 lbs (12/12/12) Last weight at [facility]: 214.5 lbs (Pre-Op Class 06/07/12) Goal weight: 130 lbs   Weight today: 174.5 lbs  Weight change: - 12.5 lbs  Total weight loss:  40.0 lbs  TANITA BODY COMP RESULTS   12/13/11  06/07/12  07/08/12 08/16/12 09/27/12  BMI (kg/m^2)  40.9  41.9  39.3 36.5 34.0  Fat Mass (lbs)  104.0  102.5  97.0 82.0 75.0  Fat Free Mass (lbs)  105.5  112.0  104.0 105.0 99.0  Total Body Water (lbs)  77.0  82.0  76.0 77.0 72.5    Fluid intake: 11 oz premiere protein shake @ breakfast, vitamin water/water (35-40 oz) = 45-50 oz Estimated total protein intake: 60-75g  Using straws: No Drinking while eating: No Hair loss: No Carbonated beverages: No N/V/D/C:  None Dumping syndrome:  None  Recent physical activity:  Walking 3-4x/week @ 30-45 min; water aerobics class on 1 x/week @ 60 min  Progress Towards Goal(s):  In progress.  Samples given during visit include:  PB2: 2 pkts Lot: None; Exp: 01/18/13   Nutritional Diagnosis:  [place]-3.3 Overweight/obesity related to past poor dietary habits and physical inactivity as evidenced by patient w/ recent sleeve gastrectomy surgery following dietary guidelines for continued weight loss.    Intervention:  Nutrition education/diet advancement.  Monitoring/Evaluation:  Dietary intake, exercise, and body weight. Follow up in 3 months for 6 month post-op visit.

## 2012-09-27 NOTE — Patient Instructions (Addendum)
Goals:  Follow Phase 3B: High Protein + Non-Starchy Vegetables  Continue intake lean protein foods to meet 60-80g goal  Increase fluid intake to 64oz +  Add 15 grams of carbohydrate (fruit, whole grains) with meals  Aim for >30 min of physical activity daily

## 2012-11-11 ENCOUNTER — Other Ambulatory Visit: Payer: Self-pay

## 2012-11-18 ENCOUNTER — Ambulatory Visit (INDEPENDENT_AMBULATORY_CARE_PROVIDER_SITE_OTHER): Payer: 59 | Admitting: Obstetrics & Gynecology

## 2012-11-18 ENCOUNTER — Encounter: Payer: Self-pay | Admitting: Obstetrics & Gynecology

## 2012-11-18 VITALS — BP 122/82 | HR 80 | Ht 60.0 in | Wt 159.0 lb

## 2012-11-18 DIAGNOSIS — Z0000 Encounter for general adult medical examination without abnormal findings: Secondary | ICD-10-CM

## 2012-11-18 DIAGNOSIS — Z01419 Encounter for gynecological examination (general) (routine) without abnormal findings: Secondary | ICD-10-CM

## 2012-11-18 LAB — COMPREHENSIVE METABOLIC PANEL
Albumin: 3.7 g/dL (ref 3.5–5.2)
BUN: 9 mg/dL (ref 6–23)
CO2: 26 mEq/L (ref 19–32)
Calcium: 9.4 mg/dL (ref 8.4–10.5)
Chloride: 105 mEq/L (ref 96–112)
Creat: 0.61 mg/dL (ref 0.50–1.10)
Glucose, Bld: 97 mg/dL (ref 70–99)
Potassium: 4.5 mEq/L (ref 3.5–5.3)

## 2012-11-18 LAB — LIPID PANEL
Cholesterol: 153 mg/dL (ref 0–200)
Triglycerides: 107 mg/dL (ref <150)
VLDL: 21 mg/dL (ref 0–40)

## 2012-11-18 NOTE — Addendum Note (Signed)
Addended by: Tandy Gaw C on: 11/18/2012 09:51 AM   Modules accepted: Orders

## 2012-11-18 NOTE — Progress Notes (Signed)
Subjective:    Kimberly [name] is a 42 y.o. MW P1 ( 13 son  [place]) female who presents for an annual exam. The patient has no complaints today. The patient is sexually active. GYN screening history: last pap: was normal. (2014 - HPV)  The patient wears seatbelts: yes. The patient participates in regular exercise: yes. Has the patient ever been transfused or tattooed?: yes. (tattoos) The patient reports that there is not domestic violence in her life.   Menstrual History: OB History   Grav Para Term Preterm Abortions TAB SAB Ect Mult Living                  Menarche age: 42 Coitarche: 42 yo   No LMP recorded.    The following portions of the patient's history were reviewed and updated as appropriate: allergies, current medications, past family history, past medical history, past social history, past surgical history and problem list.  Review of Systems A comprehensive review of systems was negative. Married for 17 year, denies dyspareunia. Uses BTL for contraception. Has had flu vaccine. Uses Nuvaring for amenorrhea.   Objective:    There were no vitals taken for this visit.  General Appearance:    Alert, cooperative, no distress, appears stated age  Head:    Normocephalic, without obvious abnormality, atraumatic  Eyes:    PERRL, conjunctiva/corneas clear, EOM's intact, fundi    benign, both eyes  Ears:    Normal TM's and external ear canals, both ears  Nose:   Nares normal, septum midline, mucosa normal, no drainage    or sinus tenderness  Throat:   Lips, mucosa, and tongue normal; teeth and gums normal  Neck:   Supple, symmetrical, trachea midline, no adenopathy;    thyroid:  no enlargement/tenderness/nodules; no carotid   bruit or JVD  Back:     Symmetric, no curvature, ROM normal, no CVA tenderness  Lungs:     Clear to auscultation bilaterally, respirations unlabored  Chest Wall:    No tenderness or deformity   Heart:    Regular rate and rhythm, S1 and S2 normal, no murmur, rub    or gallop  Breast Exam:    No tenderness, masses, or nipple abnormality  Abdomen:     Soft, non-tender, bowel sounds active all four quadrants,    no masses, no organomegaly  Genitalia:    Normal female without lesion, discharge or tenderness, ULN size, NT, mobile, normal adnexal exam     Extremities:   Extremities normal, atraumatic, no cyanosis or edema  Pulses:   2+ and symmetric all extremities  Skin:   Skin color, texture, turgor normal, no rashes or lesions  Lymph nodes:   Cervical, supraclavicular, and axillary nodes normal  Neurologic:   CNII-XII intact, normal strength, sensation and reflexes    throughout  .    Assessment:    Healthy female exam.    Plan:     Breast self exam technique reviewed and patient encouraged to perform self-exam monthly. Mammogram.

## 2012-11-22 ENCOUNTER — Encounter: Payer: Self-pay | Admitting: Obstetrics & Gynecology

## 2012-12-10 ENCOUNTER — Other Ambulatory Visit: Payer: Self-pay | Admitting: Obstetrics & Gynecology

## 2012-12-10 ENCOUNTER — Ambulatory Visit (HOSPITAL_COMMUNITY)
Admission: RE | Admit: 2012-12-10 | Discharge: 2012-12-10 | Disposition: A | Discharge status: Home / Self Care | Payer: 59 | Source: Ambulatory Visit | Attending: Obstetrics & Gynecology | Admitting: Obstetrics & Gynecology

## 2012-12-10 DIAGNOSIS — Z1231 Encounter for screening mammogram for malignant neoplasm of breast: Secondary | ICD-10-CM

## 2012-12-10 DIAGNOSIS — Z0000 Encounter for general adult medical examination without abnormal findings: Secondary | ICD-10-CM

## 2012-12-27 ENCOUNTER — Ambulatory Visit: Payer: 59 | Admitting: *Deleted

## 2013-02-09 ENCOUNTER — Ambulatory Visit: Payer: 59 | Admitting: Dietician

## 2013-02-09 ENCOUNTER — Ambulatory Visit (INDEPENDENT_AMBULATORY_CARE_PROVIDER_SITE_OTHER): Payer: Commercial Managed Care - PPO | Admitting: General Surgery

## 2013-02-09 ENCOUNTER — Encounter (INDEPENDENT_AMBULATORY_CARE_PROVIDER_SITE_OTHER): Payer: Self-pay | Admitting: General Surgery

## 2013-02-09 VITALS — BP 119/79 | HR 86 | Temp 97.7°F | Resp 18 | Ht 60.0 in | Wt 149.6 lb

## 2013-02-09 DIAGNOSIS — L0231 Cutaneous abscess of buttock: Secondary | ICD-10-CM

## 2013-02-09 DIAGNOSIS — L03317 Cellulitis of buttock: Secondary | ICD-10-CM

## 2013-02-09 MED ORDER — SULFAMETHOXAZOLE-TRIMETHOPRIM 400-80 MG PO TABS: 1.0000 | ORAL_TABLET | Freq: Two times a day (BID) | ORAL | Status: AC

## 2013-02-09 NOTE — Progress Notes (Signed)
Subjective:     Patient ID: Kimberly [name], female   DOB: 1970/07/29, 43 y.o.   MRN: [id number]  HPI 27 -year-old female that presents today for evaluation of agluteal abscess. This states it has been there for the last 2-3 days. She states that no drainage, no fever home.    Review of Systems  Constitutional: Negative.   HENT: Negative.   Respiratory: Negative.   Cardiovascular: Negative.   Gastrointestinal: Negative.   Neurological: Negative.   All other systems reviewed and are negative.       Objective:   Physical Exam  Constitutional: She is oriented to person, place, and time. She appears well-developed and well-nourished.  HENT:  Head: Normocephalic and atraumatic.  Eyes: Conjunctivae and EOM are normal. Pupils are equal, round, and reactive to light.  Neck: Normal range of motion. Neck supple.  Cardiovascular: Normal rate, regular rhythm and normal heart sounds.   Pulmonary/Chest: Effort normal and breath sounds normal.  Abdominal: Soft. Bowel sounds are normal. She exhibits no distension and no mass. There is no tenderness. There is no rebound and no guarding.  Genitourinary:     Small area of tenderness, no fluctuance over her left ischium, no drainage, no erythema  Musculoskeletal: Normal range of motion.  Neurological: She is alert and oriented to person, place, and time.  Skin: Skin is warm and dry.  Psychiatric: She has a normal mood and affect.       Assessment:     43 year old female with a left gluteal abscess     Plan:     1. At this time I see no fluctuance or erythema will proceed with antibiotics for 7 days, Bactrim. I also recommend warm compresses to the area. 2. Patient to follow back up in one to 2 weeks.

## 2013-02-24 ENCOUNTER — Ambulatory Visit (INDEPENDENT_AMBULATORY_CARE_PROVIDER_SITE_OTHER): Payer: Commercial Managed Care - PPO | Admitting: General Surgery

## 2013-02-24 ENCOUNTER — Encounter (INDEPENDENT_AMBULATORY_CARE_PROVIDER_SITE_OTHER): Payer: Self-pay | Admitting: General Surgery

## 2013-02-24 VITALS — BP 120/74 | HR 68 | Resp 16 | Ht 60.0 in | Wt 148.0 lb

## 2013-02-24 DIAGNOSIS — L03317 Cellulitis of buttock: Secondary | ICD-10-CM

## 2013-02-24 DIAGNOSIS — L0231 Cutaneous abscess of buttock: Secondary | ICD-10-CM

## 2013-02-24 NOTE — Progress Notes (Signed)
Patient ID: Kimberly [name], female   DOB: [birth year], 43 y.o.   MRN: [id number] Post op course Has been doing well last clinic visit. Patient has finished her antibiotics. She is no tenderness to her gluteal area.  On Exam: Her wound is dry and intact a small amount of scar tissue just deep to the previous one.   Assessment and Plan 43 year old female status post I&D of a gluteal abscess. 1. Will have patient follow up as needed    Ralene Ok, MD [facility] General & Minimally Invasive Surgery Trauma & Emergency Surgery

## 2013-04-06 ENCOUNTER — Encounter (INDEPENDENT_AMBULATORY_CARE_PROVIDER_SITE_OTHER): Payer: Self-pay | Admitting: General Surgery

## 2013-04-06 ENCOUNTER — Ambulatory Visit (INDEPENDENT_AMBULATORY_CARE_PROVIDER_SITE_OTHER): Payer: Commercial Managed Care - PPO | Admitting: General Surgery

## 2013-04-06 VITALS — BP 118/82 | HR 68 | Temp 97.6°F | Resp 14 | Ht 60.0 in | Wt 147.8 lb

## 2013-04-06 DIAGNOSIS — K912 Postsurgical malabsorption, not elsewhere classified: Secondary | ICD-10-CM

## 2013-04-06 DIAGNOSIS — Z903 Acquired absence of stomach [part of]: Principal | ICD-10-CM

## 2013-04-06 NOTE — Progress Notes (Signed)
Chief complaint: Followup sleeve gastrectomy. Perianal discomfort.  History: Patient returns for her routine followup status post sleeve gastrectomy for morbid obesity with comorbidities of hypertension and joint pain. She has continued to do extraordinarily well. Her hypertension is completely resolved off all medications. Joint pain much improved. She has excellent ongoing weight loss. Denies any GI complaints such as pain or vomiting or heartburn. She is very appropriate restriction. She is exercising regularly and feels great.  She was seen for an apparent perianal abscess several weeks ago in our office which was not felt drainable at that time and she was placed on antibiotics. It improved significantly but she notices that with strenuous exercise she will develop swelling and tenderness to the left of the anus. No drainage.  Exam: BP 118/82  Pulse 68  Temp(Src) 97.6 F (36.4 C) (Temporal)  Resp 14  Ht 5' (1.524 m)  Wt 147 lb 12.8 oz (67.042 kg)  BMI 28.87 kg/m2 Total weight loss of 66 pounds, 30 pounds since last visit  General: Very well-appearing Caucasian female Lungs: Clear breath sounds bilaterally Cardiac: Regular rate and rhythm.  Assessment and plan: Doing extremely well following the laparoscopic sleeve gastrectomy with excellent ongoing weight loss, no apparent complications from surgery and resolution of comorbidities of hypertension and chronic joint pain. She will return for her one-year followup point in 3 months

## 2013-04-06 NOTE — Patient Instructions (Signed)
Keep up the great work!

## 2013-04-25 ENCOUNTER — Ambulatory Visit (INDEPENDENT_AMBULATORY_CARE_PROVIDER_SITE_OTHER): Payer: Commercial Managed Care - PPO | Admitting: General Surgery

## 2013-04-25 ENCOUNTER — Encounter (INDEPENDENT_AMBULATORY_CARE_PROVIDER_SITE_OTHER): Payer: Self-pay | Admitting: General Surgery

## 2013-04-25 ENCOUNTER — Other Ambulatory Visit (INDEPENDENT_AMBULATORY_CARE_PROVIDER_SITE_OTHER): Payer: Self-pay

## 2013-04-25 VITALS — BP 126/82 | HR 72 | Temp 97.2°F | Resp 16 | Ht 60.0 in | Wt 147.0 lb

## 2013-04-25 DIAGNOSIS — K6289 Other specified diseases of anus and rectum: Secondary | ICD-10-CM

## 2013-04-25 DIAGNOSIS — R198 Other specified symptoms and signs involving the digestive system and abdomen: Secondary | ICD-10-CM

## 2013-04-25 DIAGNOSIS — K603 Anal fistula: Secondary | ICD-10-CM

## 2013-04-25 NOTE — Patient Instructions (Signed)
We will set up an MRI to evaluate this further.  Call the office if you develop severe swelling in the area or it becomes significantly more painful.

## 2013-04-25 NOTE — Progress Notes (Signed)
Chief Complaint  Patient presents with  . New Evaluation    eval rectal abscess;poss fistula    HISTORY:  Kimberly [name] is a 43 y.o. female who presents to clinic with a perirectal mass that appeared 3 months ago.  She was seen here and placed on antibiotics, which resolved the issue, but it resumes when she does strenuous activity or sits for long periods of time.  She denies any drainage.  She has never had this before.  She has occasional loose stools.  Denies any fevers or chills.  Denies any urgency or fecal incontinence recently.  She has never had any vaginal delivery or large pregnancies.    Past Medical History  Diagnosis Date  . Chronic headaches   . HTN (hypertension)   . Hx: UTI (urinary tract infection)     hx of  . Radial styloid tenosynovitis   . Irritable bowel syndrome   . GERD (gastroesophageal reflux disease)   . Arthritis   . Morbid obesity with BMI of 40.0-44.9, adult 12/13/11    BMI 40.9  . Anginal pain     hx of  . PONV (postoperative nausea and vomiting)        Past Surgical History  Procedure Laterality Date  . Breast reduction surgery    . Colonoscopy  10/18/10    normal, consistent with IBS  . Breast surgery  1996    reduct  . Cesarean section  2001  . Laparoscopic gastric sleeve resection N/A 06/29/2012    Procedure: LAPAROSCOPIC GASTRIC SLEEVE RESECTION;  Surgeon: Madilyn Hook, DO;  Location: [facility];  Service: General;  Laterality: N/A;  Laparoscopic Sleeve Gastrectomy with EGD  . Esophagogastroduodenoscopy N/A 06/29/2012    Procedure: ESOPHAGOGASTRODUODENOSCOPY (EGD);  Surgeon: Madilyn Hook, DO;  Location: [facility];  Service: General;  Laterality: N/A;  . Laparoscopic bilateral salpingectomy N/A 06/29/2012    Procedure: LAPAROSCOPIC BILATERAL SALPINGECTOMY;  Surgeon: Donnamae Jude, MD;  Location: [facility];  Service: Gynecology;  Laterality: N/A;      Current Outpatient Prescriptions  Medication Sig Dispense Refill  . calcium carbonate 200 MG capsule  Take 500 mg by mouth 3 (three) times daily.      . Cyanocobalamin (NASCOBAL) 500 MCG/0.1ML SOLN Place into the nose once a week.       . Multiple Vitamin (MULTIVITAMIN) tablet Take 1 tablet by mouth 2 (two) times daily.       . SUMAtriptan (IMITREX) 100 MG tablet Take 100 mg by mouth every 2 (two) hours as needed for migraine. As needed       No current facility-administered medications for this visit.     Allergies  Allergen Reactions  . Adhesive [Tape] Rash      Family History  Problem Relation Age of Onset  . Ovarian cancer Paternal Grandmother   . Colitis Father   . Crohn's disease Father   . Diabetes Father   . Diverticulitis Father   . Diabetes Mother   . Diabetes Sister   . Irritable bowel syndrome Sister   . Irritable bowel syndrome Brother   . Colon cancer Paternal Uncle       History   Social History  . Marital Status: Married    Spouse Name: N/A    Number of Children: 1  . Years of Education: N/A   Occupational History  . medical assistant    Social History Main Topics  . Smoking status: Never Smoker   . Smokeless tobacco: Never Used  .  Alcohol Use: Yes     Comment: occasional  . Drug Use: No  . Sexual Activity: None   Other Topics Concern  . None   Social History Narrative  . None       REVIEW OF SYSTEMS - PERTINENT POSITIVES ONLY: Review of Systems - General ROS: negative for - chills or fever Respiratory ROS: no cough, shortness of breath, or wheezing Cardiovascular ROS: no chest pain or dyspnea on exertion Gastrointestinal ROS: negative for - abdominal pain or blood in stools Genito-Urinary ROS: no dysuria, trouble voiding, or hematuria  EXAM: Filed Vitals:   04/25/13 0908  BP: 126/82  Pulse: 72  Temp: 97.2 F (36.2 C)  Resp: 16    General appearance: alert and cooperative Resp: clear to auscultation bilaterally Cardio: regular rate and rhythm GI: normal findings: soft, non-tender . Perianal exam: tender mass palpated in L  lateral position just distal to ext sphincter complex, no intra-anal component palpated.  No ext opening   ASSESSMENT AND PLAN: Kimberly [name] is a 42 y.o. F with a 3 mo h/o a tender, perianal mass that worsens with strenuous activity.  On exam there is a mass palpated deep to the ext sphincter.  There is no external opening noted.  I have asked her to undergo an MRI to evaluate this further and specifically evaluate for a fistula.  If this is the case, we discussed the typical surgical management of fistulas and abscesses.  I will call her with the results and schedule an EUA as needed depending on the MRI results.      Rosario Adie, MD Colon and Rectal Surgery / [facility]      Visit Diagnoses: 1. Mass of perianal area     Primary Care Physician: Kimberly Lofts, MD

## 2013-05-09 ENCOUNTER — Ambulatory Visit (HOSPITAL_COMMUNITY)
Admission: RE | Admit: 2013-05-09 | Discharge: 2013-05-09 | Disposition: A | Discharge status: Home / Self Care | Payer: 59 | Source: Ambulatory Visit | Attending: General Surgery | Admitting: General Surgery

## 2013-05-09 DIAGNOSIS — K603 Anal fistula, unspecified: Secondary | ICD-10-CM | POA: Insufficient documentation

## 2013-05-10 ENCOUNTER — Telehealth (INDEPENDENT_AMBULATORY_CARE_PROVIDER_SITE_OTHER): Payer: Self-pay | Admitting: General Surgery

## 2013-05-10 NOTE — Telephone Encounter (Signed)
Discussed MR results with pt. MRI shows only scar tissue and no active fistula.  We discussed ways to help minimize scar tissue pain over time.  She will call the office if anything changes.

## 2013-05-24 ENCOUNTER — Telehealth: Payer: Self-pay | Admitting: Nurse Practitioner

## 2013-05-24 MED ORDER — HYDROCODONE-ACETAMINOPHEN 5-325 MG PO TABS: 2.0000 | ORAL_TABLET | ORAL | Status: DC | PRN

## 2013-05-24 NOTE — Telephone Encounter (Signed)
Diagnosed with Shingles on back. Taking valtrex and doing well except night time. Would like something for pain. Melissa Noon, NP

## 2013-08-12 ENCOUNTER — Ambulatory Visit (INDEPENDENT_AMBULATORY_CARE_PROVIDER_SITE_OTHER): Payer: Commercial Managed Care - PPO | Admitting: General Surgery

## 2013-08-18 ENCOUNTER — Ambulatory Visit: Payer: 59 | Admitting: Obstetrics & Gynecology

## 2013-08-24 ENCOUNTER — Ambulatory Visit (INDEPENDENT_AMBULATORY_CARE_PROVIDER_SITE_OTHER): Payer: Commercial Managed Care - PPO | Admitting: General Surgery

## 2013-08-24 VITALS — BP 122/80 | HR 80 | Ht 60.0 in | Wt 145.0 lb

## 2013-08-24 DIAGNOSIS — Z09 Encounter for follow-up examination after completed treatment for conditions other than malignant neoplasm: Secondary | ICD-10-CM

## 2013-08-24 DIAGNOSIS — K912 Postsurgical malabsorption, not elsewhere classified: Secondary | ICD-10-CM

## 2013-08-24 DIAGNOSIS — Z9884 Bariatric surgery status: Secondary | ICD-10-CM | POA: Insufficient documentation

## 2013-08-24 NOTE — Progress Notes (Signed)
Chief complaint: Followup sleeve gastrectomy.   History: Patient returns for her routine followup status post sleeve gastrectomy Surgery date of June 2014 for morbid obesity with comorbidities of hypertension and joint pain. She has continued to do extraordinarily well. Her hypertension is completely resolved off all medications. Joint pain much improved. She has minimal rare reflux only if she eats the "wrong foods" and does not require medication. She is exercising regularly at least 5 times a day.  Exam: BP 122/80  Pulse 80  Ht 5' (1.524 m)  Wt 145 lb (65.772 kg)  BMI 28.32 kg/m2 Total weight loss of 69 pounds, 3 pounds since last visit  General: Very well-appearing Caucasian female Lungs: Clear breath sounds bilaterally Cardiac: Regular rate and rhythm. Abdomen: Soft and nontender. No hernias.  Assessment and plan: Doing extremely well following the laparoscopic sleeve gastrectomy with excellent ongoing weight loss, no apparent complications from surgery and resolution of comorbidities of hypertension and chronic joint pain. We will check lab work and call her with the results. Return in one year.

## 2013-10-21 ENCOUNTER — Other Ambulatory Visit: Payer: Self-pay

## 2014-01-16 ENCOUNTER — Telehealth: Payer: Self-pay | Admitting: *Deleted

## 2014-01-16 DIAGNOSIS — L0291 Cutaneous abscess, unspecified: Secondary | ICD-10-CM

## 2014-01-16 MED ORDER — SULFAMETHOXAZOLE-TRIMETHOPRIM 800-160 MG PO TABS: 1.0000 | ORAL_TABLET | Freq: Two times a day (BID) | ORAL | Status: DC

## 2014-01-16 NOTE — Telephone Encounter (Signed)
Pt needed an antibiotic sent in for an abscess.  I have sent in Bactrim.

## 2014-02-08 ENCOUNTER — Ambulatory Visit: Payer: 59 | Admitting: Family Medicine

## 2014-03-17 ENCOUNTER — Other Ambulatory Visit (INDEPENDENT_AMBULATORY_CARE_PROVIDER_SITE_OTHER): Payer: Self-pay | Admitting: General Surgery

## 2014-03-17 NOTE — H&P (Signed)
Kimberly [name] 03/17/2014 1:59 PM Location: [facility] Patient #: [id number] DOB: [birth year] Married / Language: Cleophus Molt / Race: White Female History of Present Illness [name] MD; 8/41/3244 2:20 PM) Patient words: perirectal abscess.  The patient is a 44 year old female who presents with anal itching. Patient to me due to a previous anorectal abscess that has recurred several times. We completed an MRI of her pelvis last year and it appeared to be an active scar tissue noted. She then developed worsening pain over the holidays in December. That resulted in drainage of a perirectal abscess. Since then it is continued to drain purulent material. She comes in today for further evaluation. She has no history of anorectal surgery or vaginal child delivery. She does have a history of IBS. She has no fecal incontinence at baseline. Other Problems Elbert Ewings, CMA; 03/17/2014 2:00 PM) Anxiety Disorder Arthritis Hemorrhoids High blood pressure Migraine Headache  Past Surgical History Elbert Ewings, CMA; 03/17/2014 2:00 PM) Cesarean Section - 1 Mammoplasty; Reduction Bilateral. Oral Surgery Resection of Stomach Sleeve Gastrectomy  Diagnostic Studies History Elbert Ewings, CMA; 03/17/2014 2:00 PM) Colonoscopy 1-5 years ago Mammogram 1-3 years ago Pap Smear 1-5 years ago  Allergies Elbert Ewings, CMA; 03/17/2014 2:00 PM) No Known Drug Allergies 03/17/2014  Medication History Elbert Ewings, CMA; 03/17/2014 2:00 PM) ALPRAZolam (0.5MG  Tablet, Oral) Active.  Social History Elbert Ewings, [place]; 03/17/2014 2:00 PM) Alcohol use Occasional alcohol use. Caffeine use Coffee. No drug use Tobacco use Never smoker.  Family History Elbert Ewings, [place]; 03/17/2014 2:00 PM) Alcohol Abuse Father. Arthritis Brother, Mother, Sister. Cerebrovascular Accident Family Members In General, Mother. Colon Cancer Family Members In General. Colon Polyps Father. Depression  Sister. Diabetes Mellitus Father, Mother, Sister. Heart Disease Father. Hypertension Father, Mother. Migraine Headache Mother, Sister. Ovarian Cancer Family Members In General.  Pregnancy / Birth History Elbert Ewings, [employer]; 03/17/2014 2:00 PM) Age at menarche 7 years. Contraceptive History Intrauterine device, Oral contraceptives. Gravida 1 Maternal age 73-30 Para 1 Regular periods     Review of Systems Elbert Ewings CMA; 03/17/2014 2:00 PM) General Not Present- Appetite Loss, Chills, Fatigue, Fever, Night Sweats, Weight Gain and Weight Loss. Skin Present- New Lesions. Not Present- Change in Wart/Mole, Dryness, Hives, Jaundice, Non-Healing Wounds, Rash and Ulcer. HEENT Present- Wears glasses/contact lenses. Not Present- Earache, Hearing Loss, Hoarseness, Nose Bleed, Oral Ulcers, Ringing in the Ears, Seasonal Allergies, Sinus Pain, Sore Throat, Visual Disturbances and Yellow Eyes. Respiratory Not Present- Bloody sputum, Chronic Cough, Difficulty Breathing, Snoring and Wheezing. Breast Not Present- Breast Mass, Breast Pain, Nipple Discharge and Skin Changes. Cardiovascular Present- Swelling of Extremities. Not Present- Chest Pain, Difficulty Breathing Lying Down, Leg Cramps, Palpitations, Rapid Heart Rate and Shortness of Breath. Gastrointestinal Not Present- Abdominal Pain, Bloating, Bloody Stool, Change in Bowel Habits, Chronic diarrhea, Constipation, Difficulty Swallowing, Excessive gas, Gets full quickly at meals, Hemorrhoids, Indigestion, Nausea, Rectal Pain and Vomiting. Female Genitourinary Present- Pelvic Pain. Not Present- Frequency, Nocturia, Painful Urination and Urgency. Musculoskeletal Present- Joint Pain and Muscle Pain. Not Present- Back Pain, Joint Stiffness, Muscle Weakness and Swelling of Extremities. Neurological Present- Headaches. Not Present- Decreased Memory, Fainting, Numbness, Seizures, Tingling, Tremor, Trouble walking and Weakness. Psychiatric Present-  Anxiety. Not Present- Bipolar, Change in Sleep Pattern, Depression, Fearful and Frequent crying.  Vitals Elbert Ewings CMA; 03/17/2014 2:01 PM) 03/17/2014 2:00 PM Weight: 146 lb Height: 60in Body Surface Area: 1.67 m Body Mass Index: 28.51 kg/m Temp.: 96.24F(Temporal)  Pulse: 71 (Regular)  Resp.: 17 (Unlabored)  BP:  122/82 (Sitting, Left Arm, Standard)     Physical Exam [name] MD; 03/06/3141 2:18 PM)  General Mental Status-Alert. General Appearance-Consistent with stated age. Hydration-Well hydrated. Voice-Normal.  Head and Neck Head-normocephalic, atraumatic with no lesions or palpable masses. Trachea-midline. Thyroid Gland Characteristics - normal size and consistency.  Eye Eyeball - Bilateral-Extraocular movements intact. Sclera/Conjunctiva - Bilateral-No scleral icterus.  Chest and Lung Exam Chest and lung exam reveals -quiet, even and easy respiratory effort with no use of accessory muscles and on auscultation, normal breath sounds, no adventitious sounds and normal vocal resonance. Inspection Chest Wall - Normal. Back - normal.  Cardiovascular Cardiovascular examination reveals -normal heart sounds, regular rate and rhythm with no murmurs and normal pedal pulses bilaterally.  Abdomen Inspection Inspection of the abdomen reveals - No Hernias. Palpation/Percussion Palpation and Percussion of the abdomen reveal - Soft, Non Tender, No Rebound tenderness, No Rigidity (guarding) and No hepatosplenomegaly. Auscultation Auscultation of the abdomen reveals - Bowel sounds normal.  Rectal Anorectal Exam External - Note: LEFT lateral external opening draining purulent material.  Neurologic Neurologic evaluation reveals -alert and oriented x 3 with no impairment of recent or remote memory. Mental Status-Normal.  Musculoskeletal Global Assessment -Note:no gross deformities.  Normal Exam - Left-Upper Extremity Strength  Normal and Lower Extremity Strength Normal. Normal Exam - Right-Upper Extremity Strength Normal and Lower Extremity Strength Normal.    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 Impression: I have recommended an exam under anesthesia with possible fistulotomy versus seton placement. Risks include bleeding, pain and incontinence if fistulotomy is performed.

## 2014-04-11 ENCOUNTER — Encounter (HOSPITAL_BASED_OUTPATIENT_CLINIC_OR_DEPARTMENT_OTHER): Payer: Self-pay | Admitting: *Deleted

## 2014-04-12 ENCOUNTER — Encounter (HOSPITAL_BASED_OUTPATIENT_CLINIC_OR_DEPARTMENT_OTHER): Payer: Self-pay | Admitting: *Deleted

## 2014-04-13 ENCOUNTER — Encounter (HOSPITAL_BASED_OUTPATIENT_CLINIC_OR_DEPARTMENT_OTHER): Payer: Self-pay | Admitting: *Deleted

## 2014-04-13 NOTE — Anesthesia Preprocedure Evaluation (Signed)
Anesthesia Evaluation  Patient identified by MRN, date of birth, ID band Patient awake    Reviewed: Allergy & Precautions, NPO status , Patient's Chart, lab work & pertinent test results  History of Anesthesia Complications (+) PONV and history of anesthetic complications  Airway Mallampati: II  TM Distance: >3 FB Neck ROM: Full    Dental no notable dental hx.    Pulmonary neg pulmonary ROS,  breath sounds clear to auscultation  Pulmonary exam normal       Cardiovascular hypertension, Rhythm:Regular Rate:Normal     Neuro/Psych  Headaches, negative psych ROS   GI/Hepatic negative GI ROS, Neg liver ROS,   Endo/Other  negative endocrine ROS  Renal/GU negative Renal ROS  negative genitourinary   Musculoskeletal  (+) Arthritis -,   Abdominal   Peds negative pediatric ROS (+)  Hematology negative hematology ROS (+)   Anesthesia Other Findings   Reproductive/Obstetrics negative OB ROS                             Anesthesia Physical Anesthesia Plan  ASA: II  Anesthesia Plan: MAC   Post-op Pain Management:    Induction: Intravenous  Airway Management Planned: Natural Airway  Additional Equipment:   Intra-op Plan:   Post-operative Plan:   Informed Consent: I have reviewed the patients History and Physical, chart, labs and discussed the procedure including the risks, benefits and alternatives for the proposed anesthesia with the patient or authorized representative who has indicated his/her understanding and acceptance.   Dental advisory given  Plan Discussed with: CRNA  Anesthesia Plan Comments:         Anesthesia Quick Evaluation

## 2014-04-13 NOTE — Progress Notes (Signed)
NPO AFTER MN.  ARRIVE AT 0600.  NEEDS HG.  

## 2014-04-14 ENCOUNTER — Encounter (HOSPITAL_BASED_OUTPATIENT_CLINIC_OR_DEPARTMENT_OTHER)
Admission: RE | Disposition: A | Discharge status: Home / Self Care | Payer: Self-pay | Source: Ambulatory Visit | Attending: General Surgery

## 2014-04-14 ENCOUNTER — Ambulatory Visit (HOSPITAL_BASED_OUTPATIENT_CLINIC_OR_DEPARTMENT_OTHER): Payer: 59 | Admitting: Anesthesiology

## 2014-04-14 ENCOUNTER — Encounter (HOSPITAL_BASED_OUTPATIENT_CLINIC_OR_DEPARTMENT_OTHER): Payer: Self-pay | Admitting: *Deleted

## 2014-04-14 ENCOUNTER — Ambulatory Visit (HOSPITAL_BASED_OUTPATIENT_CLINIC_OR_DEPARTMENT_OTHER)
Admission: RE | Admit: 2014-04-14 | Discharge: 2014-04-14 | Disposition: A | Discharge status: Home / Self Care | Payer: 59 | Source: Ambulatory Visit | Attending: General Surgery | Admitting: General Surgery

## 2014-04-14 DIAGNOSIS — M1990 Unspecified osteoarthritis, unspecified site: Secondary | ICD-10-CM | POA: Diagnosis not present

## 2014-04-14 DIAGNOSIS — R51 Headache: Secondary | ICD-10-CM | POA: Diagnosis not present

## 2014-04-14 DIAGNOSIS — F419 Anxiety disorder, unspecified: Secondary | ICD-10-CM | POA: Diagnosis not present

## 2014-04-14 DIAGNOSIS — Z79899 Other long term (current) drug therapy: Secondary | ICD-10-CM | POA: Diagnosis not present

## 2014-04-14 DIAGNOSIS — K612 Anorectal abscess: Secondary | ICD-10-CM | POA: Diagnosis not present

## 2014-04-14 DIAGNOSIS — I10 Essential (primary) hypertension: Secondary | ICD-10-CM | POA: Diagnosis not present

## 2014-04-14 DIAGNOSIS — K603 Anal fistula: Secondary | ICD-10-CM | POA: Diagnosis present

## 2014-04-14 HISTORY — DX: Presence of spectacles and contact lenses: Z97.3

## 2014-04-14 HISTORY — DX: Anorectal fistula, unspecified: K60.50

## 2014-04-14 HISTORY — DX: Personal history of other diseases of the circulatory system: Z86.79

## 2014-04-14 HISTORY — PX: EVALUATION UNDER ANESTHESIA WITH ANAL FISTULECTOMY: SHX5621

## 2014-04-14 HISTORY — DX: Anorectal fistula: K60.5

## 2014-04-14 HISTORY — DX: Essential (primary) hypertension: I10

## 2014-04-14 LAB — POCT HEMOGLOBIN-HEMACUE: Hemoglobin: 13.4 g/dL (ref 12.0–15.0)

## 2014-04-14 SURGERY — EXAM UNDER ANESTHESIA WITH ANAL FISTULECTOMY
Anesthesia: Monitor Anesthesia Care | Site: Rectum

## 2014-04-14 MED ORDER — PROMETHAZINE HCL 25 MG/ML IJ SOLN
6.2500 mg | INTRAMUSCULAR | Status: DC | PRN
  Filled 2014-04-14: qty 1

## 2014-04-14 MED ORDER — KETAMINE HCL 10 MG/ML IJ SOLN
INTRAMUSCULAR | Status: AC
  Filled 2014-04-14: qty 1

## 2014-04-14 MED ORDER — SODIUM CHLORIDE 0.9 % IJ SOLN
3.0000 mL | INTRAMUSCULAR | Status: DC | PRN
  Filled 2014-04-14: qty 3

## 2014-04-14 MED ORDER — ACETAMINOPHEN 650 MG RE SUPP
650.0000 mg | RECTAL | Status: DC | PRN
  Filled 2014-04-14: qty 1

## 2014-04-14 MED ORDER — LIDOCAINE 5 % EX OINT
TOPICAL_OINTMENT | CUTANEOUS | Status: DC | PRN
  Administered 2014-04-14: 1

## 2014-04-14 MED ORDER — FENTANYL CITRATE 0.05 MG/ML IJ SOLN
25.0000 ug | INTRAMUSCULAR | Status: DC | PRN
  Filled 2014-04-14: qty 1

## 2014-04-14 MED ORDER — SODIUM CHLORIDE 0.9 % IV SOLN
250.0000 mg | INTRAVENOUS | Status: DC | PRN
  Administered 2014-04-14: 10 ug/kg/min via INTRAVENOUS

## 2014-04-14 MED ORDER — PROPOFOL 10 MG/ML IV BOLUS
INTRAVENOUS | Status: DC | PRN
  Administered 2014-04-14: 20 mg via INTRAVENOUS

## 2014-04-14 MED ORDER — OXYCODONE HCL 5 MG PO TABS
5.0000 mg | ORAL_TABLET | ORAL | Status: DC | PRN
  Filled 2014-04-14: qty 2

## 2014-04-14 MED ORDER — SODIUM CHLORIDE 0.9 % IJ SOLN
3.0000 mL | Freq: Two times a day (BID) | INTRAMUSCULAR | Status: DC
  Filled 2014-04-14: qty 3

## 2014-04-14 MED ORDER — MIDAZOLAM HCL 2 MG/2ML IJ SOLN
INTRAMUSCULAR | Status: AC
Start: 2014-04-14 — End: 2014-04-14
  Filled 2014-04-14: qty 4

## 2014-04-14 MED ORDER — SODIUM CHLORIDE 0.9 % IV SOLN
250.0000 mL | INTRAVENOUS | Status: DC | PRN
  Filled 2014-04-14: qty 250

## 2014-04-14 MED ORDER — FENTANYL CITRATE 0.05 MG/ML IJ SOLN
INTRAMUSCULAR | Status: AC
  Filled 2014-04-14: qty 4

## 2014-04-14 MED ORDER — ONDANSETRON HCL 4 MG/2ML IJ SOLN
INTRAMUSCULAR | Status: DC | PRN
  Administered 2014-04-14: 4 mg via INTRAVENOUS

## 2014-04-14 MED ORDER — BUPIVACAINE-EPINEPHRINE 0.5% -1:200000 IJ SOLN
INTRAMUSCULAR | Status: DC | PRN
  Administered 2014-04-14: 30 mL

## 2014-04-14 MED ORDER — PROPOFOL 10 MG/ML IV EMUL
INTRAVENOUS | Status: DC | PRN
  Administered 2014-04-14: 100 ug/kg/min via INTRAVENOUS

## 2014-04-14 MED ORDER — ACETAMINOPHEN 325 MG PO TABS
650.0000 mg | ORAL_TABLET | ORAL | Status: DC | PRN
  Filled 2014-04-14: qty 2

## 2014-04-14 MED ORDER — FENTANYL CITRATE 0.05 MG/ML IJ SOLN
INTRAMUSCULAR | Status: DC | PRN
  Administered 2014-04-14: 50 ug via INTRAVENOUS

## 2014-04-14 MED ORDER — MIDAZOLAM HCL 5 MG/5ML IJ SOLN
INTRAMUSCULAR | Status: DC | PRN
  Administered 2014-04-14: 1 mg via INTRAVENOUS
  Administered 2014-04-14: 2 mg via INTRAVENOUS

## 2014-04-14 MED ORDER — KETAMINE HCL 10 MG/ML IJ SOLN
INTRAMUSCULAR | Status: DC | PRN
  Administered 2014-04-14: 2 mg via INTRAVENOUS

## 2014-04-14 MED ORDER — LIDOCAINE HCL (CARDIAC) 20 MG/ML IV SOLN
INTRAVENOUS | Status: DC | PRN
  Administered 2014-04-14: 50 mg via INTRAVENOUS

## 2014-04-14 MED ORDER — DEXAMETHASONE SODIUM PHOSPHATE 4 MG/ML IJ SOLN
INTRAMUSCULAR | Status: DC | PRN
  Administered 2014-04-14: 10 mg via INTRAVENOUS

## 2014-04-14 MED ORDER — OXYCODONE HCL 5 MG PO TABS: 5.0000 mg | ORAL_TABLET | Freq: Four times a day (QID) | ORAL | Status: DC | PRN

## 2014-04-14 MED ORDER — LACTATED RINGERS IV SOLN
INTRAVENOUS | Status: DC
  Administered 2014-04-14: 07:00:00 via INTRAVENOUS
  Filled 2014-04-14: qty 1000

## 2014-04-14 SURGICAL SUPPLY — 62 items
APL SKNCLS STERI-STRIP NONHPOA (GAUZE/BANDAGES/DRESSINGS) ×4
BENZOIN TINCTURE PRP APPL 2/3 (GAUZE/BANDAGES/DRESSINGS) ×6 IMPLANT
BLADE HEX COATED 2.75 (ELECTRODE) ×3 IMPLANT
BLADE SURG 10 STRL SS (BLADE) IMPLANT
BLADE SURG 15 STRL LF DISP TIS (BLADE) ×2 IMPLANT
BLADE SURG 15 STRL SS (BLADE) ×3
BRIEF STRETCH FOR OB PAD LRG (UNDERPADS AND DIAPERS) ×4 IMPLANT
CANISTER SUCTION 2500CC (MISCELLANEOUS) ×3 IMPLANT
CLOTH BEACON ORANGE TIMEOUT ST (SAFETY) ×1 IMPLANT
COVER BACK TABLE 60X90IN (DRAPES) ×3 IMPLANT
COVER MAYO STAND STRL (DRAPES) ×3 IMPLANT
DECANTER SPIKE VIAL GLASS SM (MISCELLANEOUS) ×1 IMPLANT
DRAPE LG THREE QUARTER DISP (DRAPES) ×1 IMPLANT
DRAPE PED LAPAROTOMY (DRAPES) ×3 IMPLANT
DRAPE UNDERBUTTOCKS STRL (DRAPE) IMPLANT
DRAPE UTILITY XL STRL (DRAPES) ×3 IMPLANT
DRSG PAD ABDOMINAL 8X10 ST (GAUZE/BANDAGES/DRESSINGS) ×2 IMPLANT
ELECT BLADE 6.5 .24CM SHAFT (ELECTRODE) IMPLANT
ELECT REM PT RETURN 9FT ADLT (ELECTROSURGICAL) ×3
ELECTRODE REM PT RTRN 9FT ADLT (ELECTROSURGICAL) ×2 IMPLANT
GAUZE SPONGE 4X4 16PLY XRAY LF (GAUZE/BANDAGES/DRESSINGS) ×2 IMPLANT
GAUZE VASELINE 3X9 (GAUZE/BANDAGES/DRESSINGS) IMPLANT
GLOVE BIO SURGEON STRL SZ 6.5 (GLOVE) ×6 IMPLANT
GLOVE BIOGEL PI IND STRL 7.5 (GLOVE) ×2 IMPLANT
GLOVE BIOGEL PI INDICATOR 7.5 (GLOVE) ×2
GLOVE INDICATOR 7.0 STRL GRN (GLOVE) ×6 IMPLANT
GLOVE SURG SS PI 7.5 STRL IVOR (GLOVE) ×2 IMPLANT
GOWN STRL REUS W/ TWL LRG LVL3 (GOWN DISPOSABLE) ×2 IMPLANT
GOWN STRL REUS W/ TWL XL LVL3 (GOWN DISPOSABLE) ×3 IMPLANT
GOWN STRL REUS W/TWL 2XL LVL3 (GOWN DISPOSABLE) ×3 IMPLANT
GOWN STRL REUS W/TWL LRG LVL3 (GOWN DISPOSABLE) ×5 IMPLANT
GOWN STRL REUS W/TWL XL LVL3 (GOWN DISPOSABLE) ×5 IMPLANT
HOOK RETRACTION 12 ELAST STAY (MISCELLANEOUS) IMPLANT
HYDROGEN PEROXIDE 16OZ (MISCELLANEOUS) ×3 IMPLANT
LEGGING LITHOTOMY PAIR STRL (DRAPES) IMPLANT
LOOP VESSEL MAXI BLUE (MISCELLANEOUS) IMPLANT
NDL HYPO 25X1 1.5 SAFETY (NEEDLE) ×1 IMPLANT
NDL SAFETY ECLIPSE 18X1.5 (NEEDLE) IMPLANT
NEEDLE HYPO 18GX1.5 SHARP (NEEDLE)
NEEDLE HYPO 25X1 1.5 SAFETY (NEEDLE) ×3 IMPLANT
NS IRRIG 500ML POUR BTL (IV SOLUTION) ×3 IMPLANT
PACK BASIN DAY SURGERY FS (CUSTOM PROCEDURE TRAY) ×3 IMPLANT
PAD ABD 8X10 STRL (GAUZE/BANDAGES/DRESSINGS) ×3 IMPLANT
PENCIL BUTTON HOLSTER BLD 10FT (ELECTRODE) ×3 IMPLANT
RETRACTOR STERILE 25.8CMX11.3 (INSTRUMENTS) IMPLANT
SPONGE GAUZE 4X4 12PLY STER LF (GAUZE/BANDAGES/DRESSINGS) ×1 IMPLANT
SPONGE SURGIFOAM ABS GEL 12-7 (HEMOSTASIS) IMPLANT
SUCTION FRAZIER TIP 10 FR DISP (SUCTIONS) IMPLANT
SUT CHROMIC 2 0 SH (SUTURE) ×3 IMPLANT
SUT CHROMIC 3 0 SH 27 (SUTURE) ×2 IMPLANT
SUT ETHIBOND 0 (SUTURE) IMPLANT
SUT SILK 2 0 (SUTURE)
SUT SILK 2-0 18XBRD TIE 12 (SUTURE) IMPLANT
SUT VIC AB 3-0 SH 18 (SUTURE) IMPLANT
SUT VIC AB 4-0 P-3 18XBRD (SUTURE) IMPLANT
SUT VIC AB 4-0 P3 18 (SUTURE)
SYR CONTROL 10ML LL (SYRINGE) ×3 IMPLANT
SYRINGE 10CC LL (SYRINGE) ×2 IMPLANT
TOWEL OR 17X24 6PK STRL BLUE (TOWEL DISPOSABLE) ×3 IMPLANT
TRAY DSU PREP LF (CUSTOM PROCEDURE TRAY) ×3 IMPLANT
TUBE CONNECTING 12X1/4 (SUCTIONS) ×3 IMPLANT
YANKAUER SUCT BULB TIP NO VENT (SUCTIONS) ×3 IMPLANT

## 2014-04-14 NOTE — Interval H&P Note (Signed)
History and Physical Interval Note:  04/14/2014 7:17 AM  Kimberly [name] L [place]  has presented today for surgery, with the diagnosis of anorectal fistula  The various methods of treatment have been discussed with the patient and family. After consideration of risks, benefits and other options for treatment, the patient has consented to  Procedure(s): ANAL EXAM UNDER ANESTHESIA WITH POSSIBLE FISTULOTOMY (N/A) POSSIBLE PLACEMENT OF SETON (N/A) as a surgical intervention .  The patient's history has been reviewed, patient examined, no change in status, stable for surgery.  I have reviewed the patient's chart and labs.  Questions were answered to the patient's satisfaction.     Rosario Adie, MD  [facility]

## 2014-04-14 NOTE — Anesthesia Postprocedure Evaluation (Signed)
  Anesthesia Post-op Note  Patient: Kimberly [name]  Procedure(s) Performed: Procedure(s) (LRB): ANAL EXAM UNDER ANESTHESIA WITH  FISTULOTOMY (N/A)  Patient Location: PACU  Anesthesia Type: MAC  Level of Consciousness: awake and alert   Airway and Oxygen Therapy: Patient Spontanous Breathing  Post-op Pain: mild  Post-op Assessment: Post-op Vital signs reviewed, Patient's Cardiovascular Status Stable, Respiratory Function Stable, Patent Airway and No signs of Nausea or vomiting  Last Vitals:  Filed Vitals:   04/14/14 0900  BP: 130/86  Pulse: 70  Temp: 37 C  Resp: 24    Post-op Vital Signs: stable   Complications: No apparent anesthesia complications

## 2014-04-14 NOTE — Op Note (Signed)
04/14/2014  8:05 AM  PATIENT:  Kimberly [name]  44 y.o. female  Patient Care Team: Jinny Sanders, MD as PCP - [place], RD as Dietitian ([place])  PRE-OPERATIVE DIAGNOSIS:  anorectal fistula  POST-OPERATIVE DIAGNOSIS:  anorectal fistula  PROCEDURE:  ANAL EXAM UNDER ANESTHESIA WITH  FISTULOTOMY  Surgeon(s): [name], MD  ASSISTANT: none   ANESTHESIA:   local and MAC  SPECIMEN:  No Specimen  DISPOSITION OF SPECIMEN:  PATHOLOGY  COUNTS:  YES  PLAN OF CARE: Discharge to home after PACU  PATIENT DISPOSITION:  PACU - hemodynamically stable.  INDICATION: 44 y.o. F with recurrent L sided perirectal abscess that has progressed to active fistula.   OR FINDINGS: L antero-lateral fistula with minimal involvement of sphincter complex  DESCRIPTION: the patient was identified in the preoperative holding area and taken to the OR where they were laid on the operating room table.  MAC anesthesia was induced without difficulty. The patient was then positioned in prone jackknife position with buttocks gently taped apart.  The patient was then prepped and draped in usual sterile fashion.  SCDs were noted to be in place prior to the initiation of anesthesia. A surgical timeout was performed indicating the correct patient, procedure, positioning and need for preoperative antibiotics.  I began with a digital rectal exam.  There were some hypertrophied papilla noted.  I placed a rectal block using Marcaine with epinephrine.  I then placed a Hill-Ferguson anoscope into the anal canal and evaluated this completely.  There was a defect noted at anterior midline internally.  I placed a fistula probe through the external defect and it easily traversed the fistula.  I did a partial fistulotomy and then assessed for sphincter involvement.  There was minimal involvement, so I decided to perform a fistulotomy.  The edges were then marsupialized with running 2-0 chromic for the external  portion and 3-0 Chromic for the internal portion.  All counts were correct per OR staff.  The patient was awakened from anesthesia and sent to the PACU in stable condition.

## 2014-04-14 NOTE — Discharge Instructions (Addendum)
ANORECTAL SURGERY: POST OP INSTRUCTIONS 1. Take your usually prescribed home medications unless otherwise directed. 2. DIET: During the first few hours after surgery sip on some liquids until you are able to urinate.  It is normal to not urinate for several hours after this surgery.  If you feel uncomfortable, please contact the office for instructions.  After you are able to urinate,you may eat, if you feel like it.  Follow a light bland diet the first 24 hours after arrival home, such as soup, liquids, crackers, etc.  Be sure to include lots of fluids daily (6-8 glasses).  Avoid fast food or heavy meals, as your are more likely to get nauseated.  Eat a low fat diet the next few days after surgery.  Limit caffeine intake to 1-2 servings a day. 3. PAIN CONTROL: a. Pain is best controlled by a usual combination of several different methods TOGETHER: i. Muscle relaxation: Soak in a warm bath (or Sitz bath) three times a day and after bowel movements or you may get in the shower and let warm water run over the wound to clean it.  Continue to do this until all pain is resolved.  Avoid wiping with dry toilet paper. ii. Over the counter pain medication iii. Prescription pain medication b. Most patients will experience some swelling and discomfort in the anus/rectal area and incisions.  Heat such as warm towels, sitz baths, warm baths, etc to help relax tight/sore spots and speed recovery.  Some people prefer to use ice, especially in the first couple days after surgery, as it may decrease the pain and swelling, or alternate between ice & heat.  Experiment to what works for you.  Swelling and bruising can take several weeks to resolve.  Pain can take even longer to completely resolve. c. It is helpful to take an over-the-counter pain medication regularly for the first few weeks.  Choose one of the following that works best for you: i. Naproxen (Aleve, etc)  Two 220mg  tabs twice a day ii. Ibuprofen (Advil, etc)  Three 200mg  tabs four times a day (every meal & bedtime) d. A  prescription for pain medication (such as percocet, oxycodone, hydrocodone, etc) should be given to you upon discharge.  Take your pain medication as prescribed.  i. If you are having problems/concerns with the prescription medicine (does not control pain, nausea, vomiting, rash, itching, etc), please call us [phone] to see if we need to switch you to a different pain medicine that will work better for you and/or control your side effect better. ii. If you need a refill on your pain medication, please contact your pharmacy.  They will contact our office to request authorization. Prescriptions will not be filled after 5 pm or on week-ends. 4. KEEP YOUR BOWELS REGULAR and AVOID CONSTIPATION a. The goal is one to two soft bowel movements a day.  You should at least have a bowel movement every other day. b. Avoid getting constipated.  Between the surgery and the pain medications, it is common to experience some constipation. This can be very painful after rectal surgery.  Increasing fluid intake and taking a fiber supplement (such as Metamucil, Citrucel, FiberCon, etc) 1-2 times a day regularly will usually help prevent this problem from occurring.  A stool softener like colace is also recommended.  This can be purchased over the counter at your pharmacy.  You can take it up to 3 times a day.  If you do not have a bowel movement  after 24 hrs since your surgery, take one does of milk of magnesia.  If you still haven't had a bowel movement 8-12 hours after that dose, take another dose.  If you don't have a bowel movement 48 hrs after surgery, purchase a Fleets enema from the drug store and administer gently per package instructions.  If you still are having trouble with your bowel movements after that, please call the office for further instructions. c. If you develop diarrhea or have many loose bowel movements, simplify your diet to bland foods  & liquids for a few days.  Stop any stool softeners and decrease your fiber supplement.  Switching to mild anti-diarrheal medications (Kayopectate, Pepto Bismol) can help.  If this worsens or does not improve, please call us.  5. Wound Care a. Remove your bandages before your first bowel movement or 8 hours after surgery.     b. Remove any wound packing material at this tim,e as well.  You do not need to repack the wound unless instructed otherwise.  Wear an absorbent pad or soft cotton gauze in your underwear to catch any drainage and help keep the area clean. You should change this every 2-3 hours while awake. c. Keep the area clean and dry.  Bathe / shower every day, especially after bowel movements.  Keep the area clean by showering / bathing over the incision / wound.   It is okay to soak an open wound to help wash it.  Wet wipes or showers / gentle washing after bowel movements is often less traumatic than regular toilet paper. d. Dennis Bast may have some styrofoam-like soft packing in the rectum which will come out with the first bowel movement.  e. You will often notice bleeding with bowel movements.  This should slow down by the end of the first week of surgery f. Expect some drainage.  This should slow down, too, by the end of the first week of surgery.  Wear an absorbent pad or soft cotton gauze in your underwear until the drainage stops. g. Do Not sit on a rubber or pillow ring.  This can make you symptoms worse.  You may sit on a soft pillow if needed.  6. ACTIVITIES as tolerated:   a. You may resume regular (light) daily activities beginning the next day--such as daily self-care, walking, climbing stairs--gradually increasing activities as tolerated.  If you can walk 30 minutes without difficulty, it is safe to try more intense activity such as jogging, treadmill, bicycling, low-impact aerobics, swimming, etc. b. Save the most intensive and strenuous activity for last such as sit-ups, heavy  lifting, contact sports, etc  Refrain from any heavy lifting or straining until you are off narcotics for pain control.   c. You may drive when you are no longer taking prescription pain medication, you can comfortably sit for long periods of time, and you can safely maneuver your car and apply brakes. d. Dennis Bast may have sexual intercourse when it is comfortable.  7. FOLLOW UP in our office a. Please call [facility] at [phone] to set up an appointment to see your surgeon in the office for a follow-up appointment approximately 3-4 weeks after your surgery. b. Make sure that you call for this appointment the day you arrive home to insure a convenient appointment time. 10. IF YOU HAVE DISABILITY OR FAMILY LEAVE FORMS, BRING THEM TO THE OFFICE FOR PROCESSING.  DO NOT GIVE THEM TO YOUR DOCTOR.     WHEN TO CALL us (  [phone]) [phone]: 1. Poor pain control 2. Reactions / problems with new medications (rash/itching, nausea, etc)  3. Fever over 101.5 F (38.5 C) 4. Inability to urinate 5. Nausea and/or vomiting 6. Worsening swelling or bruising 7. Continued bleeding from incision. 8. Increased pain, redness, or drainage from the incision  The clinic staff is available to answer your questions during regular business hours (8:30am-5pm).  Please dont hesitate to call and ask to speak to one of our nurses for clinical concerns.   A surgeon from [facility] is always on call at the hospitals   If you have a medical emergency, go to the nearest emergency room or call 911.    [facility], [place], [place], [place], [place]  [id number] ? MAIN: [phone] ? TOLL FREE: [phone] ? FAX [phone] [email or web address]    Post Anesthesia Home Care Instructions  Activity: Get plenty of rest for the remainder of the day. A responsible adult should stay with you for 24 hours following the procedure.  For the next 24 hours, DO NOT: -Drive a  car -Paediatric nurse -Drink alcoholic beverages -Take any medication unless instructed by your physician -Make any legal decisions or sign important papers.  Meals: Start with liquid foods such as gelatin or soup. Progress to regular foods as tolerated. Avoid greasy, spicy, heavy foods. If nausea and/or vomiting occur, drink only clear liquids until the nausea and/or vomiting subsides. Call your physician if vomiting continues.  Special Instructions/Symptoms: Your throat may feel dry or sore from the anesthesia or the breathing tube placed in your throat during surgery. If this causes discomfort, gargle with warm salt water. The discomfort should disappear within 24 hours.  If you had a scopolamine patch placed behind your ear for the management of post- operative nausea and/or vomiting:  1. The medication in the patch is effective for 72 hours, after which it should be removed.  Wrap patch in a tissue and discard in the trash. Wash hands thoroughly with soap and water. 2. You may remove the patch earlier than 72 hours if you experience unpleasant side effects which may include dry mouth, dizziness or visual disturbances. 3. Avoid touching the patch. Wash your hands with soap and water after contact with the patch.

## 2014-04-14 NOTE — Transfer of Care (Signed)
Last Vitals:  Filed Vitals:   04/14/14 0639  BP: 134/82  Pulse: 68  Temp: 37 C  Resp: 16   Immediate Anesthesia Transfer of Care Note  Patient: Kimberly [name]  Procedure(s) Performed: Procedure(s) (LRB): ANAL EXAM UNDER ANESTHESIA WITH  FISTULOTOMY (N/A)  Patient Location: PACU  Anesthesia Type: MAC  Level of Consciousness: awake, alert  and oriented  Airway & Oxygen Therapy: Patient Spontanous Breathing and Patient connected to face mask oxygen  Post-op Assessment: Report given to PACU RN and Post -op Vital signs reviewed and stable  Post vital signs: Reviewed and stable  Complications: No apparent anesthesia complications

## 2014-04-14 NOTE — H&P (View-Only) (Signed)
Kimberly [name] 03/17/2014 1:59 PM Location: [facility] Patient #: [id number] DOB: [birth year] Married / Language: Kimberly [name] / Race: White Female History of Present Illness Kimberly [name]; 1/69/6789 2:20 PM) Patient words: perirectal abscess.  The patient is a 44 year old female who presents with anal itching. Patient to me due to a previous anorectal abscess that has recurred several times. We completed an MRI of her pelvis last year and it appeared to be an active scar tissue noted. She then developed worsening pain over the holidays in December. That resulted in drainage of a perirectal abscess. Since then it is continued to drain purulent material. She comes in today for further evaluation. She has no history of anorectal surgery or vaginal child delivery. She does have a history of IBS. She has no fecal incontinence at baseline. Other Problems Kimberly [name]; 03/17/2014 2:00 PM) Anxiety Disorder Arthritis Hemorrhoids High blood pressure Migraine Headache  Past Surgical History Kimberly [name]; 03/17/2014 2:00 PM) Cesarean Section - 1 Mammoplasty; Reduction Bilateral. Oral Surgery Resection of Stomach Sleeve Gastrectomy  Diagnostic Studies History Kimberly [name]; 03/17/2014 2:00 PM) Colonoscopy 1-5 years ago Mammogram 1-3 years ago Pap Smear 1-5 years ago  Allergies Kimberly [name]; 03/17/2014 2:00 PM) No Known Drug Allergies 03/17/2014  Medication History Kimberly [name]; 03/17/2014 2:00 PM) ALPRAZolam (0.5MG  Tablet, Oral) Active.  Social History Kimberly [name]; 03/17/2014 2:00 PM) Alcohol use Occasional alcohol use. Caffeine use Coffee. No drug use Tobacco use Never smoker.  Family History Kimberly [name]; 03/17/2014 2:00 PM) Alcohol Abuse Father. Arthritis Brother, Mother, Sister. Cerebrovascular Accident Family Members In General, Mother. Colon Cancer Family Members In General. Colon Polyps Father. Depression  Sister. Diabetes Mellitus Father, Mother, Sister. Heart Disease Father. Hypertension Father, Mother. Migraine Headache Mother, Sister. Ovarian Cancer Family Members In General.  Pregnancy / Birth History Kimberly [name]; 03/17/2014 2:00 PM) Age at menarche 35 years. Contraceptive History Intrauterine device, Oral contraceptives. Gravida 1 Maternal age 26-30 Para 1 Regular periods     Review of Systems Kimberly [name] [name]; 03/17/2014 2:00 PM) General Not Present- Appetite Loss, Chills, Fatigue, Fever, Night Sweats, Weight Gain and Weight Loss. Skin Present- New Lesions. Not Present- Change in Wart/Mole, Dryness, Hives, Jaundice, Non-Healing Wounds, Rash and Ulcer. HEENT Present- Wears glasses/contact lenses. Not Present- Earache, Hearing Loss, Hoarseness, Nose Bleed, Oral Ulcers, Ringing in the Ears, Seasonal Allergies, Sinus Pain, Sore Throat, Visual Disturbances and Yellow Eyes. Respiratory Not Present- Bloody sputum, Chronic Cough, Difficulty Breathing, Snoring and Wheezing. Breast Not Present- Breast Mass, Breast Pain, Nipple Discharge and Skin Changes. Cardiovascular Present- Swelling of Extremities. Not Present- Chest Pain, Difficulty Breathing Lying Down, Leg Cramps, Palpitations, Rapid Heart Rate and Shortness of Breath. Gastrointestinal Not Present- Abdominal Pain, Bloating, Bloody Stool, Change in Bowel Habits, Chronic diarrhea, Constipation, Difficulty Swallowing, Excessive gas, Gets full quickly at meals, Hemorrhoids, Indigestion, Nausea, Rectal Pain and Vomiting. Female Genitourinary Present- Pelvic Pain. Not Present- Frequency, Nocturia, Painful Urination and Urgency. Musculoskeletal Present- Joint Pain and Muscle Pain. Not Present- Back Pain, Joint Stiffness, Muscle Weakness and Swelling of Extremities. Neurological Present- Headaches. Not Present- Decreased Memory, Fainting, Numbness, Seizures, Tingling, Tremor, Trouble walking and Weakness. Psychiatric Present-  Anxiety. Not Present- Bipolar, Change in Sleep Pattern, Depression, Fearful and Frequent crying.  Vitals Kimberly [name] [name]; 03/17/2014 2:01 PM) 03/17/2014 2:00 PM Weight: 146 lb Height: 60in Body Surface Area: 1.67 m Body Mass Index: 28.51 kg/m Temp.: 96.5F(Temporal)  Pulse: 71 (Regular)  Resp.: 17 (Unlabored)  BP:  122/82 (Sitting, Left Arm, Standard)     Physical Exam Kimberly [name]; 05/23/6158 2:18 PM)  General Mental Status-Alert. General Appearance-Consistent with stated age. Hydration-Well hydrated. Voice-Normal.  Head and Neck Head-normocephalic, atraumatic with no lesions or palpable masses. Trachea-midline. Thyroid Gland Characteristics - normal size and consistency.  Eye Eyeball - Bilateral-Extraocular movements intact. Sclera/Conjunctiva - Bilateral-No scleral icterus.  Chest and Lung Exam Chest and lung exam reveals -quiet, even and easy respiratory effort with no use of accessory muscles and on auscultation, normal breath sounds, no adventitious sounds and normal vocal resonance. Inspection Chest Wall - Normal. Back - normal.  Cardiovascular Cardiovascular examination reveals -normal heart sounds, regular rate and rhythm with no murmurs and normal pedal pulses bilaterally.  Abdomen Inspection Inspection of the abdomen reveals - No Hernias. Palpation/Percussion Palpation and Percussion of the abdomen reveal - Soft, Non Tender, No Rebound tenderness, No Rigidity (guarding) and No hepatosplenomegaly. Auscultation Auscultation of the abdomen reveals - Bowel sounds normal.  Rectal Anorectal Exam External - Note: LEFT lateral external opening draining purulent material.  Neurologic Neurologic evaluation reveals -alert and oriented x 3 with no impairment of recent or remote memory. Mental Status-Normal.  Musculoskeletal Global Assessment -Note:no gross deformities.  Normal Exam - Left-Upper Extremity Strength  Normal and Lower Extremity Strength Normal. Normal Exam - Right-Upper Extremity Strength Normal and Lower Extremity Strength Normal.    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 Impression: I have recommended an exam under anesthesia with possible fistulotomy versus seton placement. Risks include bleeding, pain and incontinence if fistulotomy is performed.

## 2014-04-14 NOTE — Anesthesia Procedure Notes (Signed)
Procedure Name: MAC Date/Time: 04/14/2014 7:31 AM Performed by: Mechele Claude Pre-anesthesia Checklist: Patient identified, Timeout performed, Emergency Drugs available, Suction available and Patient being monitored Patient Re-evaluated:Patient Re-evaluated prior to inductionOxygen Delivery Method: Nasal cannula Placement Confirmation: positive ETCO2

## 2014-04-17 ENCOUNTER — Encounter (HOSPITAL_BASED_OUTPATIENT_CLINIC_OR_DEPARTMENT_OTHER): Payer: Self-pay | Admitting: General Surgery

## 2014-05-19 ENCOUNTER — Telehealth: Payer: Self-pay | Admitting: *Deleted

## 2014-05-19 NOTE — Telephone Encounter (Signed)
Per Dr. Hulan Fray, call in patient Xanax 0.5 mg tabs #30 no refills. Done.  Pt aware.

## 2014-06-06 ENCOUNTER — Ambulatory Visit (INDEPENDENT_AMBULATORY_CARE_PROVIDER_SITE_OTHER): Payer: 59 | Admitting: Physician Assistant

## 2014-06-06 VITALS — BP 110/76 | HR 76 | Resp 18 | Ht 60.0 in | Wt 150.0 lb

## 2014-06-06 DIAGNOSIS — M62838 Other muscle spasm: Secondary | ICD-10-CM | POA: Diagnosis not present

## 2014-06-06 DIAGNOSIS — J029 Acute pharyngitis, unspecified: Secondary | ICD-10-CM

## 2014-06-06 DIAGNOSIS — G43009 Migraine without aura, not intractable, without status migrainosus: Secondary | ICD-10-CM | POA: Diagnosis not present

## 2014-06-06 MED ORDER — ALPRAZOLAM 0.5 MG PO TABS: 0.5000 mg | ORAL_TABLET | Freq: Three times a day (TID) | ORAL | Status: DC | PRN

## 2014-06-06 MED ORDER — VALACYCLOVIR HCL 1 G PO TABS: 1000.0000 mg | ORAL_TABLET | ORAL | Status: DC

## 2014-06-06 MED ORDER — TIZANIDINE HCL 4 MG PO TABS: 4.0000 mg | ORAL_TABLET | Freq: Four times a day (QID) | ORAL | Status: DC | PRN

## 2014-06-06 MED ORDER — SUMATRIPTAN SUCCINATE 100 MG PO TABS: 100.0000 mg | ORAL_TABLET | ORAL | Status: DC | PRN

## 2014-06-06 MED ORDER — NAPROXEN 500 MG PO TABS: 500.0000 mg | ORAL_TABLET | Freq: Two times a day (BID) | ORAL | Status: DC

## 2014-06-06 MED ORDER — AZITHROMYCIN 250 MG PO TABS: 250.0000 mg | ORAL_TABLET | Freq: Every day | ORAL | Status: DC

## 2014-06-06 NOTE — Progress Notes (Signed)
Patient ID: LEXIANNA WEINRICH, female   DOB: 08-20-1970, 44 y.o.   MRN: [id number] History:  RONESHA HEENAN is a 44 y.o. female who presents to clinic today for f/u migraine.  Headaches are mod to severe lasting up to 72 hours.  Pain begins right neck and moves up right side into right eye.  It is worsened by movement and associated with  photophobia, phonophobia and nausea.  She does not have vomiting.  Prior to the HA, she may notice right eye twitching/drooping and right ear popping.    She just recently started the HA study for prevention with CGRP.  Last week she received 3 injections and she feels she is already seeing some improvement but still has HA every day.  She thinks it is less severe.    Prior preventive meds tried were stopped with stomach surgery over a year ago. Topamax previously used but cognitive side effects were undesirable.  Effexor was more helpful.  If unable to continue benefits from CGRP study,  will consider restarting Effexor.   Imitrex and Aleve are used acutely and are helpful.  She has previously benefitted from naprosyn and zanaflex but these have run out.   Many headaches stem from upsetting situations that cause tension.   She denies recent fever, weakness, chest pain, shortness of breath.   HIT6:59 Number of days in the last 4 weeks with:  Severe headache: 9 Moderate headache: 9 Mild headache: 9 No headache: 1   Past Medical History  Diagnosis Date  . Chronic headaches   . Irritable bowel syndrome   . PONV (postoperative nausea and vomiting)   . Anorectal fistula   . History of hypertension     since wt loss after gastric sleeve 2014--  no longer issue  . Arthritis   . Wears glasses     History   Social History  . Marital Status: Married    Spouse Name: N/A  . Number of Children: 1  . Years of Education: N/A   Occupational History  . medical assistant    Social History Main Topics  . Smoking status: Never Smoker   . Smokeless tobacco: Never Used   . Alcohol Use: Yes     Comment: occasional  . Drug Use: No  . Sexual Activity: Not on file   Other Topics Concern  . Not on file   Social History Narrative    Family History  Problem Relation Age of Onset  . Ovarian cancer Paternal Grandmother   . Colitis Father   . Crohn's disease Father   . Diabetes Father   . Diverticulitis Father   . Diabetes Mother   . Diabetes Sister   . Irritable bowel syndrome Sister   . Irritable bowel syndrome Brother   . Colon cancer Paternal Uncle     Allergies  Allergen Reactions  . Adhesive [Tape] Rash    Current Outpatient Prescriptions on File Prior to Visit  Medication Sig Dispense Refill  . ALPRAZolam (XANAX) 0.5 MG tablet Take 0.5 mg by mouth 3 (three) times daily as needed for anxiety.    . Multiple Vitamin (MULTIVITAMIN) tablet Take 1 tablet by mouth daily.     Marland Kitchen oxyCODONE (OXY IR/ROXICODONE) 5 MG immediate release tablet Take 1-2 tablets (5-10 mg total) by mouth every 6 (six) hours as needed for severe pain. 30 tablet 0  . SUMAtriptan (IMITREX) 100 MG tablet Take 100 mg by mouth every 2 (two) hours as needed for migraine. As needed  No current facility-administered medications on file prior to visit.     Review of Systems:  All pertinent positive/negative included in HPI, all other review of systems are negative  Objective:  Physical Exam  BP 110/76 mmHg  Pulse 76  Resp 18  Ht 5' (1.524 m)  Wt 150 lb (68.04 kg)  BMI 29.30 kg/m2  CONSTITUTIONAL: Well-developed, well-nourished female in no acute distress.  EYES: EOM intact ENT: Normocephalic, oropharynx with erythema and small amt of exudate on left LYMPH: 2 enlarged/palpable nodes in left anterior cervical chain and submandibular CARDIOVASCULAR: Regular rate  RESPIRATORY: Normal rate. No respiratory distress  ENDOCRINE: Normal thyroid.  MUSCULOSKELETAL: Normal ROM, strength equal bilaterally, muscle spasm noted in cervical paraspinal muscles as well as  trapezius SKIN: Warm, dry without erythema  NEUROLOGICAL: Alert, oriented, CN II-XII grossly intact, Appropriate balance, Sensation equal bilaterally PSYCH: Normal behavior, mood   Assessment & Plan:  Assessment: 1. Migraine without aura and without status migrainosus, not intractable   2. Pharyngitis   3.      Muscle spasm    Plans: Continue with CGRP migraine prevention study at [facility] Imitrex with naprosyn for acute migraine - treat early Zanaflex to reduce muscle tension Xanax PRN to assist with HA/tension prevention zpack for pharyngitis.  See PCP if unimproved When finished with study - may require restarting preventive therapy for migraine   Follow-up in 1 year or sooner PRN  [name], PA-C 06/06/2014 9:32 AM

## 2014-06-21 ENCOUNTER — Telehealth: Payer: Self-pay | Admitting: *Deleted

## 2014-06-21 DIAGNOSIS — G43809 Other migraine, not intractable, without status migrainosus: Secondary | ICD-10-CM

## 2014-06-21 MED ORDER — ONDANSETRON HCL 8 MG PO TABS: 8.0000 mg | ORAL_TABLET | Freq: Three times a day (TID) | ORAL | Status: DC | PRN

## 2014-06-21 MED ORDER — PROMETHAZINE HCL 25 MG PO TABS: 25.0000 mg | ORAL_TABLET | Freq: Four times a day (QID) | ORAL | Status: DC | PRN

## 2014-06-21 NOTE — Telephone Encounter (Signed)
Patient needs refills on her Zofran and Phenergan for her Migraines.  I have sent in refills.

## 2014-11-14 ENCOUNTER — Telehealth: Payer: Self-pay | Admitting: Physician Assistant

## 2014-11-14 NOTE — Telephone Encounter (Signed)
Discussed migraine status.  Pt presently in migraine prevention study with good efficacy for her.  This will be ending soon and pt appropriately concerned about re-emergence of HA.  Air purification may be one strategy to eliminate some HA stimuli and improve condition overall.  Would recommend a purification system that has been certified by the asthma and allergy foundation.  Would expect this to minimize frequency of migraine attacks.

## 2014-11-15 ENCOUNTER — Encounter: Payer: Self-pay | Admitting: *Deleted

## 2014-11-17 ENCOUNTER — Telehealth: Payer: Self-pay | Admitting: *Deleted

## 2014-11-17 DIAGNOSIS — N390 Urinary tract infection, site not specified: Secondary | ICD-10-CM

## 2014-11-17 MED ORDER — CIPROFLOXACIN HCL 500 MG PO TABS: 500.0000 mg | ORAL_TABLET | Freq: Two times a day (BID) | ORAL | Status: DC

## 2014-11-17 NOTE — Telephone Encounter (Signed)
Patient called and is having UTI symptoms.  Per Dr. Hulan Fray, okay to send in Cipro 500 mg, take 1 tablet BID for 5 days, no refills. I have sent in to pharmacy, patient aware.

## 2014-12-06 ENCOUNTER — Encounter: Payer: Self-pay | Admitting: *Deleted

## 2015-01-16 ENCOUNTER — Telehealth: Payer: Self-pay | Admitting: *Deleted

## 2015-01-16 DIAGNOSIS — G43809 Other migraine, not intractable, without status migrainosus: Secondary | ICD-10-CM

## 2015-01-16 MED ORDER — NAPROXEN 500 MG PO TABS: 500.0000 mg | ORAL_TABLET | Freq: Two times a day (BID) | ORAL | Status: DC

## 2015-01-16 MED FILL — ALPRAZolam 0.5 MG TABS: 0.5 | 10 days supply | Qty: 30 | Fill #0

## 2015-01-16 NOTE — Telephone Encounter (Signed)
Patient requested Xanax and Naprosyn refills. Ok per [name], I have sent in Naprosyn refills x 1 year and have called in Xanax with 6 refills to [facility] outpatient pharmacy.  Patient aware.

## 2015-01-24 ENCOUNTER — Encounter: Payer: Self-pay | Admitting: Emergency Medicine

## 2015-01-24 ENCOUNTER — Ambulatory Visit
Admission: EM | Admit: 2015-01-24 | Discharge: 2015-01-24 | Disposition: A | Discharge status: Home / Self Care | Payer: PRIVATE HEALTH INSURANCE | Attending: Family Medicine | Admitting: Family Medicine

## 2015-01-24 DIAGNOSIS — J029 Acute pharyngitis, unspecified: Secondary | ICD-10-CM

## 2015-01-24 LAB — RAPID INFLUENZA A&B ANTIGENS (ARMC ONLY): INFLUENZA A (ARMC): NOT DETECTED

## 2015-01-24 LAB — RAPID INFLUENZA A&B ANTIGENS: Influenza B (ARMC): NOT DETECTED

## 2015-01-24 LAB — RAPID STREP SCREEN (MED CTR MEBANE ONLY): Streptococcus, Group A Screen (Direct): NEGATIVE

## 2015-01-24 MED ORDER — AZITHROMYCIN 250 MG PO TABS: ORAL_TABLET | ORAL | Status: DC

## 2015-01-24 NOTE — ED Notes (Signed)
Pt presents with sore throat, chills, body aches, congestion in head and now has lost her voice. Symptoms started last Saturday.

## 2015-01-24 NOTE — ED Provider Notes (Addendum)
Patient presents today with symptoms of mild sore throat, chills, myalgias, nasal drainage. Patient states that she lost her voice yesterday. She denies any productive cough, chest pain, shortness of breath, nausea, vomiting, diarrhea, abdominal pain. She does admit to having the influenza vaccination.  ROS: Negative except mentioned above. Vitals as per Epic.  GENERAL: NAD HEENT: mild pharyngeal erythema, no exudate, no erythema of TMs, mild cervical LAD RESP: CTA B CARD: tachycardia NEURO: CN II-XII grossly intact   A/P: Pharyngitis, URI- flu screen negative, rapid strep negative, throat culture sent, discussed with patient that likely viral in etiology, continue Ibuprofen when necessary, Claritin when necessary, rest, hydration, if symptoms do persist/worsen in the next few days she can go and pick up the Z-Pak that I will send to the pharmacy in case process turns bacterial.   Paulina Fusi, MD 01/24/15 [id number]  Paulina Fusi, MD 01/24/15 1220

## 2015-01-26 ENCOUNTER — Telehealth: Payer: Self-pay | Admitting: Emergency Medicine

## 2015-01-26 NOTE — ED Notes (Signed)
Patient was notified that her throat culture came back positive of Strep.  Patient states that she is not feeling better and still has her sore throat.  Patient states that she did not take her Z-Pack because when she went to the [facility] in [place] her prescription was not there.  Our records showed that the prescription was sent to [facility] instead of [facility] in [place].  I called the [facility] in [place] and verified that the antibiotic had not been picked up and therefore voided the prescription at that pharmacy.  The Z-Pack was called into the [facility] in [place].  Patient was instructed to pick up her antibiotic at the [facility] in [facility] and to take all of the antibiotic as directed in order to treat her Strep throat.  Patient was instructed to follow-up here or with her PCP if her symptoms did not improve or worsen.  Patient verbalized understanding.

## 2015-01-27 LAB — CULTURE, GROUP A STREP (THRC)

## 2015-01-29 MED FILL — NAPROXEN 500 MG TABLET: 500 | 45 days supply | Qty: 90 | Fill #0

## 2015-03-09 MED FILL — ALPRAZolam 0.5 MG TABS: 0.5 | 10 days supply | Qty: 30 | Fill #1

## 2015-04-05 ENCOUNTER — Encounter: Payer: Self-pay | Admitting: Emergency Medicine

## 2015-04-05 ENCOUNTER — Ambulatory Visit
Admission: EM | Admit: 2015-04-05 | Discharge: 2015-04-05 | Disposition: A | Discharge status: Home / Self Care | Payer: No Typology Code available for payment source | Attending: Family Medicine | Admitting: Family Medicine

## 2015-04-05 DIAGNOSIS — J219 Acute bronchiolitis, unspecified: Secondary | ICD-10-CM

## 2015-04-05 DIAGNOSIS — J0101 Acute recurrent maxillary sinusitis: Secondary | ICD-10-CM

## 2015-04-05 MED ORDER — CEFUROXIME AXETIL 500 MG PO TABS: 500.0000 mg | ORAL_TABLET | Freq: Two times a day (BID) | ORAL | Status: DC

## 2015-04-05 MED ORDER — HYDROCOD POLST-CPM POLST ER 10-8 MG/5ML PO SUER: 5.0000 mL | Freq: Two times a day (BID) | ORAL | Status: DC | PRN

## 2015-04-05 MED ORDER — OSELTAMIVIR PHOSPHATE 75 MG PO CAPS: 75.0000 mg | ORAL_CAPSULE | Freq: Two times a day (BID) | ORAL | Status: DC

## 2015-04-05 MED ORDER — ALBUTEROL SULFATE HFA 108 (90 BASE) MCG/ACT IN AERS: 2.0000 | INHALATION_SPRAY | RESPIRATORY_TRACT | Status: DC | PRN

## 2015-04-05 MED ORDER — AMOXICILLIN-POT CLAVULANATE 875-125 MG PO TABS
1.0000 | ORAL_TABLET | Freq: Two times a day (BID) | ORAL | Status: DC
Start: 2015-04-05 — End: 2015-05-04

## 2015-04-05 NOTE — ED Provider Notes (Signed)
CSN: [id number]     Arrival date & time 04/05/15  1213 History   First MD Initiated Contact with Patient 04/05/15 1256    Nurses notes were reviewed. Chief Complaint  Patient presents with  . Cough  . Fever   Patient reports things sick last Friday. Reports yellowish-green drainage from both nostrils and she started coughing up yellow stuff as well a few days ago. States that she finally came in because last night she did herself wheezing at night. As well. Several medical problems is chronic headaches IBS she says sleeve breast reduction as well. She is allergic to latex never smoked and is a strong family history of diabetes and IBS.  (Consider location/radiation/quality/duration/timing/severity/associated sxs/prior Treatment) Patient is a 45 y.o. female presenting with URI. The history is provided by the patient. No language interpreter was used.  URI Presenting symptoms: congestion, cough, fever and rhinorrhea   Presenting symptoms: no ear pain and no sore throat   Severity:  Moderate Onset quality:  Sudden Timing:  Constant Progression:  Worsening Chronicity:  New Relieved by:  Nothing Worsened by:  Nothing tried Associated symptoms: sinus pain, sneezing and wheezing   Associated symptoms: no arthralgias     Past Medical History  Diagnosis Date  . Chronic headaches   . Irritable bowel syndrome   . PONV (postoperative nausea and vomiting)   . Anorectal fistula   . History of hypertension     since wt loss after gastric sleeve 2014--  no longer issue  . Arthritis   . Wears glasses    Past Surgical History  Procedure Laterality Date  . Breast reduction surgery  1996  . Colonoscopy  10-18-2010    w/ bx's  . Cesarean section  2001  . Laparoscopic gastric sleeve resection N/A 06/29/2012    Procedure: LAPAROSCOPIC GASTRIC SLEEVE RESECTION;  Surgeon: Madilyn Hook, DO;  Location: [facility];  Service: General;  Laterality: N/A;  Laparoscopic Sleeve Gastrectomy with EGD  .  Esophagogastroduodenoscopy N/A 06/29/2012    Procedure: ESOPHAGOGASTRODUODENOSCOPY (EGD);  Surgeon: Madilyn Hook, DO;  Location: [facility];  Service: General;  Laterality: N/A;  . Laparoscopic bilateral salpingectomy N/A 06/29/2012    Procedure: LAPAROSCOPIC BILATERAL SALPINGECTOMY;  Surgeon: Donnamae Jude, MD;  Location: [facility];  Service: Gynecology;  Laterality: N/A;  . Evaluation under anesthesia with anal fistulectomy N/A 04/14/2014    Procedure: ANAL EXAM UNDER ANESTHESIA WITH  FISTULOTOMY;  Surgeon: [name], MD;  Location: [facility];  Service: General;  Laterality: N/A;   Family History  Problem Relation Age of Onset  . Ovarian cancer Paternal Grandmother   . Colitis Father   . Crohn's disease Father   . Diabetes Father   . Diverticulitis Father   . Diabetes Mother   . Diabetes Sister   . Irritable bowel syndrome Sister   . Irritable bowel syndrome Brother   . Colon cancer Paternal Uncle    Social History  Substance Use Topics  . Smoking status: Never Smoker   . Smokeless tobacco: Never Used  . Alcohol Use: Yes     Comment: occasional   OB History    No data available     Review of Systems  Constitutional: Positive for fever.  HENT: Positive for congestion, rhinorrhea and sneezing. Negative for ear pain and sore throat.   Respiratory: Positive for cough and wheezing.   Musculoskeletal: Negative for arthralgias.    Allergies  Adhesive  Home Medications   Prior to Admission medications  Medication Sig Start Date End Date Taking? Authorizing Provider  albuterol (PROVENTIL HFA;VENTOLIN HFA) 108 (90 Base) MCG/ACT inhaler Inhale 2 puffs into the lungs every 4 (four) hours as needed for wheezing or shortness of breath. 04/05/15   Frederich Cha, MD  ALPRAZolam Duanne Moron) 0.5 MG tablet Take 1 tablet (0.5 mg total) by mouth 3 (three) times daily as needed for anxiety. 06/06/14   [name], PA-C  azithromycin (ZITHROMAX Z-PAK) 250 MG tablet Use as  directed for 5 days. 01/24/15   Paulina Fusi, MD  cefUROXime (CEFTIN) 500 MG tablet Take 1 tablet (500 mg total) by mouth 2 (two) times daily. 04/05/15   Frederich Cha, MD  chlorpheniramine-HYDROcodone [facility] PENNKINETIC ER) 10-8 MG/5ML SUER Take 5 mLs by mouth every 12 (twelve) hours as needed for cough. 04/05/15   Frederich Cha, MD  ciprofloxacin (CIPRO) 500 MG tablet Take 1 tablet (500 mg total) by mouth 2 (two) times daily. 11/17/14   Emily Filbert, MD  Multiple Vitamin (MULTIVITAMIN) tablet Take 1 tablet by mouth daily.     Historical Provider, MD  naproxen (NAPROSYN) 500 MG tablet Take 1 tablet (500 mg total) by mouth 2 (two) times daily with a meal. 01/16/15   [name], PA-C  ondansetron (ZOFRAN) 8 MG tablet Take 1 tablet (8 mg total) by mouth every 8 (eight) hours as needed for nausea or vomiting. 06/21/14   [name], PA-C  oseltamivir (TAMIFLU) 75 MG capsule Take 1 capsule (75 mg total) by mouth 2 (two) times daily. 04/05/15   Frederich Cha, MD  oxyCODONE (OXY IR/ROXICODONE) 5 MG immediate release tablet Take 1-2 tablets (5-10 mg total) by mouth every 6 (six) hours as needed for severe pain. [id number]   [name], MD  promethazine (PHENERGAN) 25 MG tablet Take 1 tablet (25 mg total) by mouth every 6 (six) hours as needed for nausea or vomiting. 06/21/14   [name], PA-C  SUMAtriptan (IMITREX) 100 MG tablet Take 1 tablet (100 mg total) by mouth every 2 (two) hours as needed for migraine. As needed 06/06/14   [name], PA-C  tiZANidine (ZANAFLEX) 4 MG tablet Take 1 tablet (4 mg total) by mouth every 6 (six) hours as needed for muscle spasms. 06/06/14   [name], PA-C  valACYclovir (VALTREX) 1000 MG tablet Take 1 tablet (1,000 mg total) by mouth 1 day or 1 dose. 06/06/14   [name], PA-C   Meds Ordered and Administered this Visit  Medications - No data to display  BP 149/108 mmHg  Pulse 80  Temp(Src) 98.6 F (37 C) (Oral)  Resp 18   Ht 5' (1.524 m)  Wt 152 lb (68.947 kg)  BMI 29.69 kg/m2  SpO2 100%  LMP 03/21/2015 (Exact Date) No data found.   Physical Exam  Constitutional: She is oriented to person, place, and time. She appears well-developed and well-nourished.  HENT:  Head: Normocephalic and atraumatic.  Right Ear: Hearing, tympanic membrane, external ear and ear canal normal.  Left Ear: Hearing, tympanic membrane, external ear and ear canal normal.  Nose: No mucosal edema or rhinorrhea.  Mouth/Throat: Dental caries present. No uvula swelling. No posterior oropharyngeal erythema.  Eyes: Conjunctivae are normal. Pupils are equal, round, and reactive to light.  Neck: Normal range of motion.  Cardiovascular: Normal rate.   Pulmonary/Chest: Effort normal. She has wheezes.  Musculoskeletal: Normal range of motion. She exhibits no edema.  Neurological: She is alert  and oriented to person, place, and time.  Skin: Skin is warm and dry.  Psychiatric: She has a normal mood and affect.  Vitals reviewed.   ED Course  Procedures (including critical care time)  Labs Review Labs Reviewed - No data to display  Imaging Review No results found.   Visual Acuity Review  Right Eye Distance:   Left Eye Distance:   Bilateral Distance:    Right Eye Near:   Left Eye Near:    Bilateral Near:         MDM   1. Acute recurrent maxillary sinusitis   2. Acute bronchiolitis with bronchospasm    Patient will place on Tussionex 1 teaspoon twice a day, Allegra-D 1 tablet daily, Ceftin 500 mg twice a day and will give albuterol inhaler to use only on a when necessary basis. Offered work note for today and tomorrow she states is not needed and declines. Prescription sent to pharmacy of her choice [facility]. Recommend she sees her PCP if not better in one week to 2 weeks.  Note: This dictation was prepared with Dragon dictation along with smaller phrase technology. Any transcriptional errors that result from  this process are unintentional.  Frederich Cha, MD 04/05/15 1400

## 2015-04-05 NOTE — ED Notes (Signed)
Pt presents with cough and congestion with yellow sputum in color, chills and fever. Been going on for about a week.

## 2015-04-05 NOTE — Discharge Instructions (Signed)
Bronchospasm, Adult A bronchospasm is when the tubes that carry air in and out of your lungs (airways) spasm or tighten. During a bronchospasm it is hard to breathe. This is because the airways get smaller. A bronchospasm can be triggered by:  Allergies. These may be to animals, pollen, food, or mold.  Infection. This is a common cause of bronchospasm.  Exercise.  Irritants. These include pollution, cigarette smoke, strong odors, aerosol sprays, and paint fumes.  Weather changes.  Stress.  Being emotional. HOME CARE   Always have a plan for getting help. Know when to call your doctor and local emergency services (911 in the [place] Know where you can get emergency care.  Only take medicines as told by your doctor.  If you were prescribed an inhaler or nebulizer machine, ask your doctor how to use it correctly. Always use a spacer with your inhaler if you were given one.  Stay calm during an attack. Try to relax and breathe more slowly.  Control your home environment:  Change your heating and air conditioning filter at least once a month.  Limit your use of fireplaces and wood stoves.  Do not  smoke. Do not  allow smoking in your home.  Avoid perfumes and fragrances.  Get rid of pests (such as roaches and mice) and their droppings.  Throw away plants if you see mold on them.  Keep your house clean and dust free.  Replace carpet with wood, tile, or vinyl flooring. Carpet can trap dander and dust.  Use allergy-proof pillows, mattress covers, and box spring covers.  Wash bed sheets and blankets every week in hot water. Dry them in a dryer.  Use blankets that are made of polyester or cotton.  Wash hands frequently. GET HELP IF:  You have muscle aches.  You have chest pain.  The thick spit you spit or cough up (sputum) changes from clear or white to yellow, green, gray, or bloody.  The thick spit you spit or cough up gets thicker.  There are problems that may be  related to the medicine you are given such as:  A rash.  Itching.  Swelling.  Trouble breathing. GET HELP RIGHT AWAY IF:  You feel you cannot breathe or catch your breath.  You cannot stop coughing.  Your treatment is not helping you breathe better.  You have very bad chest pain. MAKE SURE YOU:   Understand these instructions.  Will watch your condition.  Will get help right away if you are not doing well or get worse.   This information is not intended to replace advice given to you by your health care provider. Make sure you discuss any questions you have with your health care provider.   Document Released: 10/20/2008 Document Revised: 01/13/2014 Document Reviewed: 06/15/2012 Elsevier Interactive Patient Education 2016 Elsevier Inc[employer]  Sinusitis, Adult Sinusitis is redness, soreness, and puffiness (inflammation) of the air pockets in the bones of your face (sinuses). The redness, soreness, and puffiness can cause air and mucus to get trapped in your sinuses. This can allow germs to grow and cause an infection.  HOME CARE   Drink enough fluids to keep your pee (urine) clear or pale yellow.  Use a humidifier in your home.  Run a hot shower to create steam in the bathroom. Sit in the bathroom with the door closed. Breathe in the steam 3-4 times a day.  Put a warm, moist washcloth on your face 3-4 times a day, or as told  by your doctor.  Use salt water sprays (saline sprays) to wet the thick fluid in your nose. This can help the sinuses drain.  Only take medicine as told by your doctor. GET HELP RIGHT AWAY IF:   Your pain gets worse.  You have very bad headaches.  You are sick to your stomach (nauseous).  You throw up (vomit).  You are very sleepy (drowsy) all the time.  Your face is puffy (swollen).  Your vision changes.  You have a stiff neck.  You have trouble breathing. MAKE SURE YOU:   Understand these instructions.  Will watch your  condition.  Will get help right away if you are not doing well or get worse.   This information is not intended to replace advice given to you by your health care provider. Make sure you discuss any questions you have with your health care provider.   Document Released: 06/11/2007 Document Revised: 01/13/2014 Document Reviewed: 07/29/2011 Elsevier Interactive Patient Education [employer].

## 2015-04-06 MED FILL — NAPROXEN 500 MG TABLET: 500 | 45 days supply | Qty: 90 | Fill #1

## 2015-04-06 MED FILL — AMOX TR-K CLV 875-125 MG TA: 875-125 | 10 days supply | Qty: 20 | Fill #0

## 2015-04-06 MED FILL — ALPRAZolam 0.5 MG TABS: 0.5 | 10 days supply | Qty: 30 | Fill #2

## 2015-05-04 ENCOUNTER — Encounter: Payer: Self-pay | Admitting: *Deleted

## 2015-05-04 ENCOUNTER — Ambulatory Visit (INDEPENDENT_AMBULATORY_CARE_PROVIDER_SITE_OTHER): Payer: PRIVATE HEALTH INSURANCE | Admitting: Family Medicine

## 2015-05-04 ENCOUNTER — Encounter: Payer: Self-pay | Admitting: Family Medicine

## 2015-05-04 VITALS — BP 139/96 | HR 72 | Resp 18 | Ht 60.0 in | Wt 155.0 lb

## 2015-05-04 DIAGNOSIS — N920 Excessive and frequent menstruation with regular cycle: Secondary | ICD-10-CM | POA: Diagnosis not present

## 2015-05-04 DIAGNOSIS — Z1151 Encounter for screening for human papillomavirus (HPV): Secondary | ICD-10-CM

## 2015-05-04 DIAGNOSIS — Z124 Encounter for screening for malignant neoplasm of cervix: Secondary | ICD-10-CM

## 2015-05-04 NOTE — Assessment & Plan Note (Signed)
Pending work-up--could move to IUD placement---has had and tolerated except for acne--could use some spironolactone to combat if needed--discussed other options including oral meds, endometrial ablation and hysterectomy--leaning toward IUD.

## 2015-05-04 NOTE — Progress Notes (Signed)
    Subjective:    Patient ID: Kimberly [name] is a 45 y.o. female presenting with Menorrhagia  on 05/04/2015  HPI: Needs a pap smear. Reports having pain left side and not every month.Pain is sharp and then crampy in lower back and legs. Bleeding is heavy 7-10 days and monthly and x 6 months. Pain with ovulation and heavy discharge, then bleeding heavy. Gets more fluid retention and then her BP goes up again.  Review of Systems  Constitutional: Negative for fever and chills.  Respiratory: Negative for shortness of breath.   Cardiovascular: Negative for chest pain.  Gastrointestinal: Negative for nausea, vomiting and abdominal pain.  Genitourinary: Negative for dysuria.  Skin: Negative for rash.      Objective:    BP 139/96 mmHg  Pulse 72  Resp 18  Ht 5' (1.524 m)  Wt 155 lb (70.308 kg)  BMI 30.27 kg/m2  LMP 04/25/2015 Physical Exam  Constitutional: She is oriented to person, place, and time. She appears well-developed and well-nourished. No distress.  HENT:  Head: Normocephalic and atraumatic.  Eyes: No scleral icterus.  Neck: Neck supple.  Cardiovascular: Normal rate.   Pulmonary/Chest: Effort normal.  Abdominal: Soft.  Genitourinary: Vagina normal.  Cervix appears normal.  Neurological: She is alert and oriented to person, place, and time.  Skin: Skin is warm and dry.  Psychiatric: She has a normal mood and affect.   Procedure: Patient given informed consent, signed copy in the chart, time out was performed. Appropriate time out taken. . The patient was placed in the lithotomy position and the cervix brought into view with sterile speculum.  Portio of cervix cleansed x 2 with betadine swabs.  A tenaculum was placed in the anterior lip of the cervix.  The uterus was sounded for depth of 8 cm. A pipelle was introduced to into the uterus, suction created,  and an endometrial sample was obtained. All equipment was removed and accounted for.  The patient tolerated the  procedure well.      Assessment & Plan:   Problem List Items Addressed This Visit      Unprioritized   Menorrhagia with regular cycle - Primary    Pending work-up--could move to IUD placement---has had and tolerated except for acne--could use some spironolactone to combat if needed--discussed other options including oral meds, endometrial ablation and hysterectomy--leaning toward IUD.      Relevant Orders   US Pelvis Complete   US Transvaginal Non-OB   Surgical pathology    Other Visit Diagnoses    Screening for cervical cancer        Relevant Orders    Cytology - PAP       Total face-to-face time with patient: 25 minutes. Over 50% of encounter was spent on counseling and coordination of care. Return in about 4 weeks (around 06/01/2015) for a follow-up.  Ronnika Collett S 05/04/2015 10:22 AM

## 2015-05-04 NOTE — Patient Instructions (Signed)

## 2015-05-07 LAB — CYTOLOGY - PAP

## 2015-05-14 ENCOUNTER — Telehealth: Payer: Self-pay | Admitting: *Deleted

## 2015-05-14 ENCOUNTER — Ambulatory Visit (HOSPITAL_COMMUNITY)
Admission: RE | Admit: 2015-05-14 | Discharge: 2015-05-14 | Disposition: A | Discharge status: Home / Self Care | Payer: PRIVATE HEALTH INSURANCE | Source: Ambulatory Visit | Attending: Family Medicine | Admitting: Family Medicine

## 2015-05-14 DIAGNOSIS — N8312 Corpus luteum cyst of left ovary: Secondary | ICD-10-CM | POA: Diagnosis not present

## 2015-05-14 DIAGNOSIS — D251 Intramural leiomyoma of uterus: Secondary | ICD-10-CM | POA: Diagnosis not present

## 2015-05-14 DIAGNOSIS — N920 Excessive and frequent menstruation with regular cycle: Secondary | ICD-10-CM | POA: Diagnosis not present

## 2015-05-14 NOTE — Telephone Encounter (Signed)
Called pt, no answer, left message to call the office.  

## 2015-05-14 NOTE — Telephone Encounter (Signed)
-----   Message from Donnamae Jude, MD sent at 05/14/2015 11:57 AM EDT ----- Needs colpo

## 2015-05-15 MED FILL — ALPRAZolam 0.5 MG TABS: 0.5 | 10 days supply | Qty: 30 | Fill #3

## 2015-06-05 ENCOUNTER — Ambulatory Visit: Payer: PRIVATE HEALTH INSURANCE | Admitting: Family Medicine

## 2015-06-05 ENCOUNTER — Ambulatory Visit (INDEPENDENT_AMBULATORY_CARE_PROVIDER_SITE_OTHER): Payer: PRIVATE HEALTH INSURANCE | Admitting: Family Medicine

## 2015-06-05 ENCOUNTER — Encounter: Payer: Self-pay | Admitting: Family Medicine

## 2015-06-05 VITALS — BP 145/88 | HR 69 | Resp 16 | Ht 60.0 in | Wt 156.0 lb

## 2015-06-05 DIAGNOSIS — Z30011 Encounter for initial prescription of contraceptive pills: Secondary | ICD-10-CM | POA: Diagnosis not present

## 2015-06-05 DIAGNOSIS — Z30430 Encounter for insertion of intrauterine contraceptive device: Secondary | ICD-10-CM | POA: Diagnosis not present

## 2015-06-05 DIAGNOSIS — R87810 Cervical high risk human papillomavirus (HPV) DNA test positive: Secondary | ICD-10-CM

## 2015-06-05 DIAGNOSIS — R87622 Low grade squamous intraepithelial lesion on cytologic smear of vagina (LGSIL): Secondary | ICD-10-CM | POA: Diagnosis not present

## 2015-06-05 DIAGNOSIS — N920 Excessive and frequent menstruation with regular cycle: Secondary | ICD-10-CM

## 2015-06-05 MED ORDER — NORETHINDRONE 0.35 MG PO TABS
1.0000 | ORAL_TABLET | Freq: Every day | ORAL | Status: DC
Start: 2015-06-05 — End: 2016-09-02

## 2015-06-05 NOTE — Patient Instructions (Signed)

## 2015-06-05 NOTE — Assessment & Plan Note (Signed)
Since failed IUD placement may return with cytotec. Patient elects for POP's with BP check. Could do HTA as well.

## 2015-06-05 NOTE — Progress Notes (Signed)
    Subjective:    Patient ID: ANAIIS WIDDOWSON is a 45 y.o. female presenting with Colposcopy  on 06/05/2015  HPI: Has LGSIL-+HRHPV needs colposcopy. Would like IUD placed to try to stop bleeding.  Review of Systems  Constitutional: Negative for fever and chills.  Respiratory: Negative for shortness of breath.   Cardiovascular: Negative for chest pain.  Gastrointestinal: Negative for nausea, vomiting and abdominal pain.  Genitourinary: Positive for vaginal bleeding and menstrual problem. Negative for dysuria.  Skin: Negative for rash.      Objective:    BP 145/88 mmHg  Pulse 69  Resp 16  Ht 5' (1.524 m)  Wt 156 lb (70.761 kg)  BMI 30.47 kg/m2  LMP 05/22/2015 Physical Exam  Constitutional: She is oriented to person, place, and time. She appears well-developed and well-nourished. No distress.  HENT:  Head: Normocephalic and atraumatic.  Eyes: No scleral icterus.  Neck: Neck supple.  Cardiovascular: Normal rate.   Pulmonary/Chest: Effort normal.  Abdominal: Soft.  Genitourinary:  BUS normal, vagina is pink and rugated, cervix is nulliparous without lesion   Neurological: She is alert and oriented to person, place, and time.  Skin: Skin is warm and dry.  Psychiatric: She has a normal mood and affect.   Procedure: Patient identified, informed consent performed, signed copy in chart, time out was performed.   Speculum placed in the vagina.  Cervix visualized.  Cleaned with Betadine x 2.  Grasped anteriourly with a single tooth tenaculum.  Uterus could not be sounded or entered with Liletta IUD. Patient had too much pain.  Procedure: Patient given informed consent, signed copy in the chart, time out was performed.  Placed in lithotomy position. Cervix viewed with speculum and colposcope after application of acetic acid.   Colposcopy adequate?  yes [place] lesions?yes anteriorly Punctation?no Mosaicism?  no Abnormal vasculature?  no Biopsies?2 and 8  o'clock ECC?yes  COMMENTS: Patient was given post procedure instructions.  She will have results via myChart       Assessment & Plan:   Problem List Items Addressed This Visit      Unprioritized   Menorrhagia with regular cycle    Since failed IUD placement may return with cytotec. Patient elects for POP's with BP check. Could do HTA as well.      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    Dalen Hennessee S 06/05/2015 4:27 PM

## 2015-06-05 NOTE — Assessment & Plan Note (Signed)
S/p colposcopy

## 2015-06-06 ENCOUNTER — Encounter: Payer: Self-pay | Admitting: *Deleted

## 2015-06-19 ENCOUNTER — Telehealth: Payer: Self-pay | Admitting: *Deleted

## 2015-06-19 MED ORDER — MISOPROSTOL 200 MCG PO TABS: ORAL_TABLET | ORAL | Status: DC

## 2015-06-19 NOTE — Telephone Encounter (Signed)
Sent Cytotec to pharmacy per IUD inser protocol

## 2015-06-20 MED FILL — NORETHINDRONE 0.35 MG TAB: 0.35 | 28 days supply | Qty: 28 | Fill #0

## 2015-06-20 MED FILL — miSOPROStol 200 MCG TABS: 200 | 1 days supply | Qty: 3 | Fill #0

## 2015-07-17 ENCOUNTER — Ambulatory Visit (INDEPENDENT_AMBULATORY_CARE_PROVIDER_SITE_OTHER): Payer: PRIVATE HEALTH INSURANCE | Admitting: Obstetrics & Gynecology

## 2015-07-17 ENCOUNTER — Encounter: Payer: Self-pay | Admitting: Obstetrics & Gynecology

## 2015-07-17 ENCOUNTER — Encounter: Payer: Self-pay | Admitting: *Deleted

## 2015-07-17 VITALS — BP 143/80 | HR 74 | Resp 16 | Wt 154.0 lb

## 2015-07-17 DIAGNOSIS — Z30430 Encounter for insertion of intrauterine contraceptive device: Secondary | ICD-10-CM

## 2015-07-17 DIAGNOSIS — N938 Other specified abnormal uterine and vaginal bleeding: Secondary | ICD-10-CM

## 2015-07-17 NOTE — Progress Notes (Signed)
   Subjective:    Patient ID: Kimberly [name], female    DOB: 1970/05/25, 45 y.o.   MRN: [id number]  HPI [name] is here for a second attempt at [place] insertion. She took 600 mcg of cytotec last night.   Review of Systems     Objective:   Physical Exam Consent signed, Time out procedure done. Bimanual exam revealed a NSSA, mobile, NT uterus. Cervix prepped with betadine and grasped with a single tooth tenaculum. Liletta was easily placed after using the dilator on her nulliparous, stenotic cervix and the strings were cut to 3-4 cm. Uterus sounded to 9 cm. She tolerated the procedure well.     Assessment & Plan:  Contraception- [place]

## 2016-01-28 ENCOUNTER — Encounter (HOSPITAL_COMMUNITY): Payer: Self-pay

## 2016-09-02 ENCOUNTER — Encounter: Payer: Self-pay | Admitting: Family Medicine

## 2016-09-02 ENCOUNTER — Ambulatory Visit (INDEPENDENT_AMBULATORY_CARE_PROVIDER_SITE_OTHER): Payer: BLUE CROSS/BLUE SHIELD | Admitting: Family Medicine

## 2016-09-02 VITALS — BP 160/98 | HR 66 | Ht 60.0 in | Wt 169.0 lb

## 2016-09-02 DIAGNOSIS — Z01419 Encounter for gynecological examination (general) (routine) without abnormal findings: Secondary | ICD-10-CM

## 2016-09-02 DIAGNOSIS — Z113 Encounter for screening for infections with a predominantly sexual mode of transmission: Secondary | ICD-10-CM

## 2016-09-02 DIAGNOSIS — Z124 Encounter for screening for malignant neoplasm of cervix: Secondary | ICD-10-CM | POA: Diagnosis not present

## 2016-09-02 DIAGNOSIS — N920 Excessive and frequent menstruation with regular cycle: Secondary | ICD-10-CM

## 2016-09-02 DIAGNOSIS — I10 Essential (primary) hypertension: Secondary | ICD-10-CM

## 2016-09-02 MED ORDER — HYDROCHLOROTHIAZIDE 25 MG PO TABS: 25.0000 mg | ORAL_TABLET | Freq: Every day | ORAL | 3 refills | Status: DC

## 2016-09-02 NOTE — Progress Notes (Signed)
Subjective:     Kimberly [name] is a 46 y.o. female and is here for a comprehensive physical exam. The patient reports problems - mulitple. Bleeding most days, butt not as heavy as before her IUD. Has family h/o endometrial cancer.  Weight gain wihtout change in dietary habits. Mood swings. Thinks much of this is related to her IUD.   Social History   Social History  . Marital status: Married    Spouse name: N/A  . Number of children: 1  . Years of education: N/A   Occupational History  . medical assistant    Social History Main Topics  . Smoking status: Never Smoker  . Smokeless tobacco: Never Used  . Alcohol use Yes     Comment: occasional  . Drug use: No  . Sexual activity: Not Currently    Birth control/ protection: None   Other Topics Concern  . Not on file   Social History Narrative  . No narrative on file   Health Maintenance  Topic Date Due  . HIV Screening  02/24/1987  . TETANUS/TDAP  02/24/1991  . INFLUENZA VACCINE  08/06/2016  . PAP SMEAR  05/04/2018    The following portions of the patient's history were reviewed and updated as appropriate: allergies, current medications, past family history, past medical history, past social history, past surgical history and problem list.  Review of Systems Pertinent items noted in HPI and remainder of comprehensive ROS otherwise negative.   Objective:    BP (!) 160/98   Pulse 66   Ht 5' (1.524 m)   Wt 169 lb (76.7 kg)   LMP 08/15/2016   BMI 33.01 kg/m  General appearance: alert, cooperative and appears stated age Head: Normocephalic, without obvious abnormality, atraumatic Neck: no adenopathy, supple, symmetrical, trachea midline and thyroid not enlarged, symmetric, no tenderness/mass/nodules Lungs: clear to auscultation bilaterally Breasts: normal appearance, no masses or tenderness Heart: regular rate and rhythm, S1, S2 normal, no murmur, click, rub or gallop Abdomen: soft, non-tender; bowel sounds normal; no  masses,  no organomegaly Pelvic: cervix normal in appearance, external genitalia normal, no adnexal masses or tenderness, no cervical motion tenderness, uterus normal size, shape, and consistency, vagina normal without discharge and IUD strings visualized Extremities: extremities normal, atraumatic, no cyanosis or edema Pulses: 2+ and symmetric Skin: Skin color, texture, turgor normal. No rashes or lesions Lymph nodes: Cervical, supraclavicular, and axillary nodes normal. Neurologic: Grossly normal   Procedure: Speculum placed inside vagina.  Cervix visualized.  Strings grasped with ring forceps.  IUD removed intact.  Assessment:    GYN female exam.      Plan:   Problem List Items Addressed This Visit      Unprioritized   Essential hypertension - Primary    Restart meds due to amount of elevation      Relevant Medications   hydrochlorothiazide (HYDRODIURIL) 25 MG tablet   Menorrhagia with regular cycle    This continues to be an issue--she is not a great candidate for OC's due to BP. Has h/o prior C-section. Will schedule for TVH. Risks include but are not limited to bleeding, infection, injury to surrounding structures, including bowel, bladder and ureters, blood clots, and death.  Likelihood of success is high.  S/p EMB last year which was benign.       Other Visit Diagnoses    Screening for malignant neoplasm of cervix       Relevant Orders   Cytology - PAP   Encounter for gynecological  examination without abnormal finding       Relevant Orders   Lipid panel (Completed)   CMP and Liver (Completed)   CBC (Completed)   TSH (Completed)     Return in 1 year (on 09/02/2017).    See After Visit Summary for Counseling Recommendations

## 2016-09-02 NOTE — Patient Instructions (Signed)
Preventive Care 18-39 Years, Female Preventive care refers to lifestyle choices and visits with your health care provider that can promote health and wellness. What does preventive care include?  A yearly physical exam. This is also called an annual well check.  Dental exams once or twice a year.  Routine eye exams. Ask your health care provider how often you should have your eyes checked.  Personal lifestyle choices, including: ? Daily care of your teeth and gums. ? Regular physical activity. ? Eating a healthy diet. ? Avoiding tobacco and drug use. ? Limiting alcohol use. ? Practicing safe sex. ? Taking vitamin and mineral supplements as recommended by your health care provider. What happens during an annual well check? The services and screenings done by your health care provider during your annual well check will depend on your age, overall health, lifestyle risk factors, and family history of disease. Counseling Your health care provider may ask you questions about your:  Alcohol use.  Tobacco use.  Drug use.  Emotional well-being.  Home and relationship well-being.  Sexual activity.  Eating habits.  Work and work Statistician.  Method of birth control.  Menstrual cycle.  Pregnancy history.  Screening You may have the following tests or measurements:  Height, weight, and BMI.  Diabetes screening. This is done by checking your blood sugar (glucose) after you have not eaten for a while (fasting).  Blood pressure.  Lipid and cholesterol levels. These may be checked every 5 years starting at age 66.  Skin check.  Hepatitis C blood test.  Hepatitis B blood test.  Sexually transmitted disease (STD) testing.  BRCA-related cancer screening. This may be done if you have a family history of breast, ovarian, tubal, or peritoneal cancers.  Pelvic exam and Pap test. This may be done every 3 years starting at age 40. Starting at age 59, this may be done every 5  years if you have a Pap test in combination with an HPV test.  Discuss your test results, treatment options, and if necessary, the need for more tests with your health care provider. Vaccines Your health care provider may recommend certain vaccines, such as:  Influenza vaccine. This is recommended every year.  Tetanus, diphtheria, and acellular pertussis (Tdap, Td) vaccine. You may need a Td booster every 10 years.  Varicella vaccine. You may need this if you have not been vaccinated.  HPV vaccine. If you are 69 or younger, you may need three doses over 6 months.  Measles, mumps, and rubella (MMR) vaccine. You may need at least one dose of MMR. You may also need a second dose.  Pneumococcal 13-valent conjugate (PCV13) vaccine. You may need this if you have certain conditions and were not previously vaccinated.  Pneumococcal polysaccharide (PPSV23) vaccine. You may need one or two doses if you smoke cigarettes or if you have certain conditions.  Meningococcal vaccine. One dose is recommended if you are age 27-21 years and a first-year college student living in a residence hall, or if you have one of several medical conditions. You may also need additional booster doses.  Hepatitis A vaccine. You may need this if you have certain conditions or if you travel or work in places where you may be exposed to hepatitis A.  Hepatitis B vaccine. You may need this if you have certain conditions or if you travel or work in places where you may be exposed to hepatitis B.  Haemophilus influenzae type b (Hib) vaccine. You may need this if  you have certain risk factors.  Talk to your health care provider about which screenings and vaccines you need and how often you need them. This information is not intended to replace advice given to you by your health care provider. Make sure you discuss any questions you have with your health care provider. Document Released: 02/18/2001 Document Revised: 09/12/2015  Document Reviewed: 10/24/2014 Elsevier Interactive Patient Education  2017 [employer].

## 2016-09-02 NOTE — Progress Notes (Signed)
Pap last year - LSIL and Positive HPV

## 2016-09-03 LAB — CMP AND LIVER
ALT: 7 IU/L (ref 0–32)
AST: 19 IU/L (ref 0–40)
Albumin: 4.2 g/dL (ref 3.5–5.5)
Alkaline Phosphatase: 91 IU/L (ref 39–117)
BILIRUBIN TOTAL: 0.5 mg/dL (ref 0.0–1.2)
BUN: 14 mg/dL (ref 6–24)
Bilirubin, Direct: 0.14 mg/dL (ref 0.00–0.40)
CALCIUM: 8.9 mg/dL (ref 8.7–10.2)
CO2: 25 mmol/L (ref 20–29)
CREATININE: 0.71 mg/dL (ref 0.57–1.00)
Chloride: 99 mmol/L (ref 96–106)
GFR calc Af Amer: 120 mL/min/{1.73_m2} (ref >59)
GFR, EST NON AFRICAN AMERICAN: 104 mL/min/{1.73_m2} (ref >59)
GLUCOSE: 89 mg/dL (ref 65–99)
POTASSIUM: 3.9 mmol/L (ref 3.5–5.2)
Sodium: 138 mmol/L (ref 134–144)
Total Protein: 7.6 g/dL (ref 6.0–8.5)

## 2016-09-03 LAB — CBC
HEMATOCRIT: 43.2 % (ref 34.0–46.6)
Hemoglobin: 14.7 g/dL (ref 11.1–15.9)
MCH: 32.3 pg (ref 26.6–33.0)
MCHC: 34 g/dL (ref 31.5–35.7)
MCV: 95 fL (ref 79–97)
PLATELETS: 241 10*3/uL (ref 150–379)
RBC: 4.55 x10E6/uL (ref 3.77–5.28)
RDW: 13.3 % (ref 12.3–15.4)
WBC: 6.4 10*3/uL (ref 3.4–10.8)

## 2016-09-03 LAB — LIPID PANEL
CHOL/HDL RATIO: 3.1 ratio (ref 0.0–4.4)
Cholesterol, Total: 170 mg/dL (ref 100–199)
HDL: 54 mg/dL (ref >39)
LDL Calculated: 100 mg/dL — ABNORMAL HIGH (ref 0–99)
Triglycerides: 78 mg/dL (ref 0–149)
VLDL CHOLESTEROL CAL: 16 mg/dL (ref 5–40)

## 2016-09-03 LAB — TSH: TSH: 1.45 u[IU]/mL (ref 0.450–4.500)

## 2016-09-03 MED FILL — HYDROCHLOROTHIAZIDE 25 MG T: 25 | 30 days supply | Qty: 30 | Fill #0

## 2016-09-03 NOTE — Assessment & Plan Note (Addendum)
This continues to be an issue--she is not a great candidate for OC's due to BP. Has h/o prior C-section. Will schedule for TVH. Risks include but are not limited to bleeding, infection, injury to surrounding structures, including bowel, bladder and ureters, blood clots, and death.  Likelihood of success is high.  S/p EMB last year which was benign.

## 2016-09-03 NOTE — Assessment & Plan Note (Signed)
Restart meds due to amount of elevation

## 2016-09-04 LAB — CYTOLOGY - PAP
CHLAMYDIA, DNA PROBE: NEGATIVE
Diagnosis: NEGATIVE
NEISSERIA GONORRHEA: NEGATIVE

## 2016-09-15 ENCOUNTER — Encounter (HOSPITAL_COMMUNITY): Payer: Self-pay

## 2016-09-15 ENCOUNTER — Encounter: Payer: Self-pay | Admitting: Family Medicine

## 2016-10-02 MED FILL — HYDROCHLOROTHIAZIDE 25 MG T: 25 | 30 days supply | Qty: 30 | Fill #1

## 2016-11-11 ENCOUNTER — Encounter (HOSPITAL_COMMUNITY): Admission: RE | Payer: Self-pay | Source: Ambulatory Visit

## 2016-11-11 ENCOUNTER — Ambulatory Visit (HOSPITAL_COMMUNITY)
Admission: RE | Admit: 2016-11-11 | Payer: BLUE CROSS/BLUE SHIELD | Source: Ambulatory Visit | Admitting: Family Medicine

## 2016-11-11 SURGERY — HYSTERECTOMY, VAGINAL
Anesthesia: Choice

## 2016-11-14 ENCOUNTER — Encounter (HOSPITAL_COMMUNITY): Payer: Self-pay

## 2016-12-05 ENCOUNTER — Encounter: Payer: Self-pay | Admitting: *Deleted

## 2017-01-01 NOTE — Patient Instructions (Addendum)
Your procedure is scheduled on: Tuesday January 13, 2017 at 7:30 am  Enter through the Main Entrance of [facility] at: 6:00 am  Pick up the phone at the desk and dial [phone].  Call this number if you have problems the morning of surgery: [phone].  Remember: Do NOT eat food or drink any liquids after: Midnight on Monday January 7  Take these medicines the morning of surgery with a SIP OF WATER: None  STOP ALL VITAMINS AND SUPPLEMENTS 1 WEEK PRIOR TO SURGERY  Do NOT wear jewelry (body piercing), metal hair clips/bobby pins, make-up, or nail polish. Do NOT wear lotions, powders, or perfumes.  You may wear deoderant. Do NOT shave for 48 hours prior to surgery. Do NOT bring valuables to the hospital. Contacts, dentures, or bridgework may not be worn into surgery. Leave suitcase in car.  After surgery it may be brought to your room.   For patients admitted to the hospital, checkout time is 11:00 AM the day of discharge.

## 2017-01-09 ENCOUNTER — Encounter (HOSPITAL_COMMUNITY)
Admission: RE | Admit: 2017-01-09 | Discharge: 2017-01-09 | Disposition: A | Discharge status: Home / Self Care | Payer: BLUE CROSS/BLUE SHIELD | Source: Ambulatory Visit | Attending: Family Medicine | Admitting: Family Medicine

## 2017-01-09 ENCOUNTER — Ambulatory Visit: Payer: BLUE CROSS/BLUE SHIELD | Admitting: Physician Assistant

## 2017-01-09 ENCOUNTER — Other Ambulatory Visit: Payer: Self-pay

## 2017-01-09 ENCOUNTER — Encounter (HOSPITAL_COMMUNITY): Payer: Self-pay

## 2017-01-09 ENCOUNTER — Encounter: Payer: Self-pay | Admitting: Physician Assistant

## 2017-01-09 VITALS — BP 138/90 | HR 72 | Wt 160.1 lb

## 2017-01-09 DIAGNOSIS — N926 Irregular menstruation, unspecified: Secondary | ICD-10-CM | POA: Insufficient documentation

## 2017-01-09 DIAGNOSIS — Z0183 Encounter for blood typing: Secondary | ICD-10-CM | POA: Diagnosis not present

## 2017-01-09 DIAGNOSIS — G43009 Migraine without aura, not intractable, without status migrainosus: Secondary | ICD-10-CM | POA: Diagnosis not present

## 2017-01-09 DIAGNOSIS — Z01812 Encounter for preprocedural laboratory examination: Secondary | ICD-10-CM | POA: Diagnosis not present

## 2017-01-09 HISTORY — DX: Anxiety disorder, unspecified: F41.9

## 2017-01-09 LAB — TYPE AND SCREEN
ABO/RH(D): A POS
Antibody Screen: NEGATIVE

## 2017-01-09 LAB — COMPREHENSIVE METABOLIC PANEL
ALT: 13 U/L — ABNORMAL LOW (ref 14–54)
AST: 19 U/L (ref 15–41)
Albumin: 3.6 g/dL (ref 3.5–5.0)
Alkaline Phosphatase: 77 U/L (ref 38–126)
Anion gap: 9 (ref 5–15)
BUN: 10 mg/dL (ref 6–20)
CO2: 28 mmol/L (ref 22–32)
Calcium: 8.4 mg/dL — ABNORMAL LOW (ref 8.9–10.3)
Chloride: 101 mmol/L (ref 101–111)
Creatinine, Ser: 0.64 mg/dL (ref 0.44–1.00)
GFR calc Af Amer: >60 mL/min (ref >60)
GFR calc non Af Amer: >60 mL/min (ref >60)
Glucose, Bld: 96 mg/dL (ref 65–99)
Potassium: 3.7 mmol/L (ref 3.5–5.1)
Sodium: 138 mmol/L (ref 135–145)
Total Bilirubin: 0.7 mg/dL (ref 0.3–1.2)
Total Protein: 7.6 g/dL (ref 6.5–8.1)

## 2017-01-09 LAB — CBC
HCT: 43.4 % (ref 36.0–46.0)
HEMOGLOBIN: 14.2 g/dL (ref 12.0–15.0)
MCH: 32.4 pg (ref 26.0–34.0)
MCHC: 32.7 g/dL (ref 30.0–36.0)
MCV: 99.1 fL (ref 78.0–100.0)
Platelets: 233 10*3/uL (ref 150–400)
RBC: 4.38 MIL/uL (ref 3.87–5.11)
RDW: 13.2 % (ref 11.5–15.5)
WBC: 8.1 10*3/uL (ref 4.0–10.5)

## 2017-01-09 LAB — ABO/RH: ABO/RH(D): A POS

## 2017-01-09 MED ORDER — SUMATRIPTAN SUCCINATE 100 MG PO TABS: 100.0000 mg | ORAL_TABLET | ORAL | 3 refills | Status: DC | PRN

## 2017-01-09 MED ORDER — ONDANSETRON HCL 8 MG PO TABS: 8.0000 mg | ORAL_TABLET | Freq: Three times a day (TID) | ORAL | 2 refills | Status: DC | PRN

## 2017-01-09 MED ORDER — NAPROXEN 500 MG PO TABS
500.0000 mg | ORAL_TABLET | Freq: Two times a day (BID) | ORAL | 3 refills | Status: DC
Start: 2017-01-09 — End: 2017-11-13

## 2017-01-09 MED FILL — ONDANSETRON HCL 8 MG TABLET: 8 | 13 days supply | Qty: 40 | Fill #0

## 2017-01-09 MED FILL — NAPROXEN 500 MG TABLET: 500 | 45 days supply | Qty: 90 | Fill #0

## 2017-01-09 MED FILL — SUMATRIPTAN SUCC 100 MG TAB: 100 | 30 days supply | Qty: 18 | Fill #0

## 2017-01-09 NOTE — Progress Notes (Signed)
History:  Kimberly [name] is a 47 y.o. G1P1 who presents to clinic today for headache followup.  The quality of headaches is unchanged.  She started [facility] for prevention in early December.  It seemed helpful until just over a week ago.  At that time, she had an exceptionally severe migraine that required multiple acute medications for relief.  She has had one other severe HA since that time.   She is having stress with her son and his anxiety about high school.  Her job is going well.  She will have a hysterectomy next week for irregular bleeding.    HIT6:62 Number of days in the last 4 weeks with:  Severe headache: 6 Moderate headache: 4 Mild headache: 5  No headache: 13   Past Medical History:  Diagnosis Date  . Anorectal fistula   . Arthritis   . Chronic headaches   . 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 Irritable bowel syndrome   . PONV (postoperative nausea and vomiting)   . Wears glasses     Social History   Socioeconomic History  . Marital status: Married    Spouse name: Not on file  . Number of children: 1  . Years of education: Not on file  . Highest education level: Not on file  Social Needs  . Financial resource strain: Not on file  . Food insecurity - worry: Not on file  . Food insecurity - inability: Not on file  . Transportation needs - medical: Not on file  . Transportation needs - non-medical: Not on file  Occupational History  . Occupation: Psychologist, sport and exercise  Tobacco Use  . Smoking status: Never Smoker  . Smokeless tobacco: Never Used  Substance and Sexual Activity  . Alcohol use: Yes    Comment: occasional  . Drug use: No  . Sexual activity: Not Currently    Birth control/protection: None  Other Topics Concern  . Not on file  Social History Narrative  . Not on file    Family History  Problem Relation Age of Onset  . Colitis Father   . Crohn's disease Father   . Diabetes Father   .  Diverticulitis Father   . Diabetes Mother   . Uterine cancer Mother 7  . Cancer Mother        endometrial  . Ovarian cancer Paternal Grandmother   . Cancer Paternal Grandmother 8       ovarian  . Diabetes Sister   . Uterine cancer Sister 44  . Cancer Sister        endometrial  . Irritable bowel syndrome Sister   . Irritable bowel syndrome Brother   . Colon cancer Paternal Uncle   . Cancer Maternal Uncle        lung    Allergies  Allergen Reactions  . Adhesive [Tape] Rash    Current Outpatient Medications on File Prior to Visit  Medication Sig Dispense Refill  . Galcanezumab-gnlm (EMGALITY) 120 MG/ML SOAJ Inject 120 mg into the skin every 30 (thirty) days.    . hydrochlorothiazide (HYDRODIURIL) 25 MG tablet Take 1 tablet (25 mg total) by mouth daily. (Patient not taking: Reported on 01/01/2017) 30 tablet 3  . ibuprofen (ADVIL,MOTRIN) 200 MG tablet Take 800 mg by mouth daily as needed for headache or moderate pain.    . SUMAtriptan (IMITREX) 100 MG tablet Take 1 tablet (100 mg total) by mouth every 2 (two)  hours as needed for migraine. As needed (Patient not taking: Reported on 01/09/2017) 30 tablet 3  . valACYclovir (VALTREX) 1000 MG tablet Take 1 tablet (1,000 mg total) by mouth 1 day or 1 dose. (Patient not taking: Reported on 01/09/2017) 90 tablet 0   No current facility-administered medications on file prior to visit.      Review of Systems:  All pertinent positive/negative included in HPI, all other review of systems are negative   Objective:  Physical Exam BP 138/90   Pulse 72   Wt 160 lb 1.6 oz (72.6 kg)   LMP 01/09/2017   BMI 31.27 kg/m  CONSTITUTIONAL: Well-developed, well-nourished female in no acute distress.  EYES: EOM intact ENT: Normocephalic CARDIOVASCULAR: Regular rate .  RESPIRATORY: Normal rate. MUSCULOSKELETAL: Normal ROM SKIN: Warm, dry without erythema  NEUROLOGICAL: Alert, oriented, CN II-XII grossly intact, Appropriate balance PSYCH: Normal  behavior, mood   Assessment & Plan:  Assessment: 1. Migraine without aura and without status migrainosus, not intractable       Plan: Continue Emgality Continue other meds as prescribed with knowledge that as Emgality becomes more effective, she will need less other medications for migraine.   Follow-up in 12 months or sooner PRN  [name], PA-C 01/09/2017 8:24 AM

## 2017-01-09 NOTE — H&P (View-Only) (Signed)
History:  Kimberly [name] is a 47 y.o. G1P1 who presents to clinic today for headache followup.  The quality of headaches is unchanged.  She started [facility] for prevention in early December.  It seemed helpful until just over a week ago.  At that time, she had an exceptionally severe migraine that required multiple acute medications for relief.  She has had one other severe HA since that time.   She is having stress with her son and his anxiety about high school.  Her job is going well.  She will have a hysterectomy next week for irregular bleeding.    HIT6:62 Number of days in the last 4 weeks with:  Severe headache: 6 Moderate headache: 4 Mild headache: 5  No headache: 13   Past Medical History:  Diagnosis Date  . Anorectal fistula   . Arthritis   . Chronic headaches   . 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 Irritable bowel syndrome   . PONV (postoperative nausea and vomiting)   . Wears glasses     Social History   Socioeconomic History  . Marital status: Married    Spouse name: Not on file  . Number of children: 1  . Years of education: Not on file  . Highest education level: Not on file  Social Needs  . Financial resource strain: Not on file  . Food insecurity - worry: Not on file  . Food insecurity - inability: Not on file  . Transportation needs - medical: Not on file  . Transportation needs - non-medical: Not on file  Occupational History  . Occupation: Psychologist, sport and exercise  Tobacco Use  . Smoking status: Never Smoker  . Smokeless tobacco: Never Used  Substance and Sexual Activity  . Alcohol use: Yes    Comment: occasional  . Drug use: No  . Sexual activity: Not Currently    Birth control/protection: None  Other Topics Concern  . Not on file  Social History Narrative  . Not on file    Family History  Problem Relation Age of Onset  . Colitis Father   . Crohn's disease Father   . Diabetes Father   .  Diverticulitis Father   . Diabetes Mother   . Uterine cancer Mother 92  . Cancer Mother        endometrial  . Ovarian cancer Paternal Grandmother   . Cancer Paternal Grandmother 71       ovarian  . Diabetes Sister   . Uterine cancer Sister 24  . Cancer Sister        endometrial  . Irritable bowel syndrome Sister   . Irritable bowel syndrome Brother   . Colon cancer Paternal Uncle   . Cancer Maternal Uncle        lung    Allergies  Allergen Reactions  . Adhesive [Tape] Rash    Current Outpatient Medications on File Prior to Visit  Medication Sig Dispense Refill  . Galcanezumab-gnlm (EMGALITY) 120 MG/ML SOAJ Inject 120 mg into the skin every 30 (thirty) days.    . hydrochlorothiazide (HYDRODIURIL) 25 MG tablet Take 1 tablet (25 mg total) by mouth daily. (Patient not taking: Reported on 01/01/2017) 30 tablet 3  . ibuprofen (ADVIL,MOTRIN) 200 MG tablet Take 800 mg by mouth daily as needed for headache or moderate pain.    . SUMAtriptan (IMITREX) 100 MG tablet Take 1 tablet (100 mg total) by mouth every 2 (two)  hours as needed for migraine. As needed (Patient not taking: Reported on 01/09/2017) 30 tablet 3  . valACYclovir (VALTREX) 1000 MG tablet Take 1 tablet (1,000 mg total) by mouth 1 day or 1 dose. (Patient not taking: Reported on 01/09/2017) 90 tablet 0   No current facility-administered medications on file prior to visit.      Review of Systems:  All pertinent positive/negative included in HPI, all other review of systems are negative   Objective:  Physical Exam BP 138/90   Pulse 72   Wt 160 lb 1.6 oz (72.6 kg)   LMP 01/09/2017   BMI 31.27 kg/m  CONSTITUTIONAL: Well-developed, well-nourished female in no acute distress.  EYES: EOM intact ENT: Normocephalic CARDIOVASCULAR: Regular rate .  RESPIRATORY: Normal rate. MUSCULOSKELETAL: Normal ROM SKIN: Warm, dry without erythema  NEUROLOGICAL: Alert, oriented, CN II-XII grossly intact, Appropriate balance PSYCH: Normal  behavior, mood   Assessment & Plan:  Assessment: 1. Migraine without aura and without status migrainosus, not intractable       Plan: Continue Emgality Continue other meds as prescribed with knowledge that as Emgality becomes more effective, she will need less other medications for migraine.   Follow-up in 12 months or sooner PRN  [name], PA-C 01/09/2017 8:24 AM

## 2017-01-09 NOTE — Patient Instructions (Signed)

## 2017-01-12 NOTE — Anesthesia Preprocedure Evaluation (Signed)
Anesthesia Evaluation  Patient identified by MRN, date of birth, ID band Patient awake    Reviewed: Allergy & Precautions, H&P , Patient's Chart, lab work & pertinent test results, reviewed documented beta blocker date and time   Airway Mallampati: II  TM Distance: >3 FB Neck ROM: full    Dental no notable dental hx.    Pulmonary    Pulmonary exam normal breath sounds clear to auscultation       Cardiovascular  Rhythm:regular Rate:Normal     Neuro/Psych    GI/Hepatic   Endo/Other    Renal/GU      Musculoskeletal   Abdominal   Peds  Hematology   Anesthesia Other Findings Two person mask with GA for Gastric bypass  Reproductive/Obstetrics                             Anesthesia Physical Anesthesia Plan  ASA: II  Anesthesia Plan: General   Post-op Pain Management:    Induction: Intravenous  PONV Risk Score and Plan:   Airway Management Planned: Oral ETT  Additional Equipment:   Intra-op Plan:   Post-operative Plan: Extubation in OR  Informed Consent: I have reviewed the patients History and Physical, chart, labs and discussed the procedure including the risks, benefits and alternatives for the proposed anesthesia with the patient or authorized representative who has indicated his/her understanding and acceptance.   Dental Advisory Given  Plan Discussed with: CRNA and Surgeon  Anesthesia Plan Comments: (  )        Anesthesia Quick Evaluation

## 2017-01-13 ENCOUNTER — Observation Stay (HOSPITAL_COMMUNITY)
Admission: RE | Admit: 2017-01-13 | Discharge: 2017-01-13 | Disposition: A | Discharge status: Home / Self Care | Payer: BLUE CROSS/BLUE SHIELD | Source: Ambulatory Visit | Attending: Family Medicine | Admitting: Family Medicine

## 2017-01-13 ENCOUNTER — Ambulatory Visit (HOSPITAL_COMMUNITY): Payer: BLUE CROSS/BLUE SHIELD | Admitting: Anesthesiology

## 2017-01-13 ENCOUNTER — Encounter (HOSPITAL_COMMUNITY): Payer: Self-pay | Admitting: Emergency Medicine

## 2017-01-13 ENCOUNTER — Other Ambulatory Visit: Payer: Self-pay

## 2017-01-13 ENCOUNTER — Encounter (HOSPITAL_COMMUNITY)
Admission: RE | Disposition: A | Discharge status: Home / Self Care | Payer: Self-pay | Source: Ambulatory Visit | Attending: Family Medicine

## 2017-01-13 DIAGNOSIS — I10 Essential (primary) hypertension: Secondary | ICD-10-CM

## 2017-01-13 DIAGNOSIS — N920 Excessive and frequent menstruation with regular cycle: Principal | ICD-10-CM | POA: Diagnosis present

## 2017-01-13 DIAGNOSIS — N72 Inflammatory disease of cervix uteri: Secondary | ICD-10-CM | POA: Diagnosis not present

## 2017-01-13 DIAGNOSIS — N800 Endometriosis of uterus: Secondary | ICD-10-CM | POA: Insufficient documentation

## 2017-01-13 DIAGNOSIS — Z9884 Bariatric surgery status: Secondary | ICD-10-CM | POA: Diagnosis not present

## 2017-01-13 DIAGNOSIS — K589 Irritable bowel syndrome without diarrhea: Secondary | ICD-10-CM | POA: Insufficient documentation

## 2017-01-13 DIAGNOSIS — F419 Anxiety disorder, unspecified: Secondary | ICD-10-CM | POA: Insufficient documentation

## 2017-01-13 DIAGNOSIS — M160 Bilateral primary osteoarthritis of hip: Secondary | ICD-10-CM | POA: Insufficient documentation

## 2017-01-13 DIAGNOSIS — D251 Intramural leiomyoma of uterus: Secondary | ICD-10-CM | POA: Insufficient documentation

## 2017-01-13 DIAGNOSIS — Z79899 Other long term (current) drug therapy: Secondary | ICD-10-CM | POA: Insufficient documentation

## 2017-01-13 DIAGNOSIS — M47812 Spondylosis without myelopathy or radiculopathy, cervical region: Secondary | ICD-10-CM | POA: Diagnosis not present

## 2017-01-13 DIAGNOSIS — Z90710 Acquired absence of both cervix and uterus: Secondary | ICD-10-CM | POA: Diagnosis present

## 2017-01-13 DIAGNOSIS — Z888 Allergy status to other drugs, medicaments and biological substances status: Secondary | ICD-10-CM | POA: Insufficient documentation

## 2017-01-13 HISTORY — PX: VAGINAL HYSTERECTOMY: SHX2639

## 2017-01-13 LAB — CBC
HCT: 37.8 % (ref 36.0–46.0)
HEMOGLOBIN: 12.4 g/dL (ref 12.0–15.0)
MCH: 32.6 pg (ref 26.0–34.0)
MCHC: 32.8 g/dL (ref 30.0–36.0)
MCV: 99.5 fL (ref 78.0–100.0)
PLATELETS: 220 10*3/uL (ref 150–400)
RBC: 3.8 MIL/uL — AB (ref 3.87–5.11)
RDW: 13.2 % (ref 11.5–15.5)
WBC: 13.4 10*3/uL — ABNORMAL HIGH (ref 4.0–10.5)

## 2017-01-13 LAB — PREGNANCY, URINE: PREG TEST UR: NEGATIVE

## 2017-01-13 SURGERY — HYSTERECTOMY, VAGINAL
Anesthesia: General | Site: Vagina

## 2017-01-13 MED ORDER — ESTRADIOL 0.1 MG/GM VA CREA
TOPICAL_CREAM | VAGINAL | Status: AC
  Filled 2017-01-13: qty 42.5

## 2017-01-13 MED ORDER — ONDANSETRON HCL 4 MG/2ML IJ SOLN: 4.0000 mg | Freq: Four times a day (QID) | INTRAMUSCULAR | Status: DC | PRN

## 2017-01-13 MED ORDER — LIDOCAINE HCL (CARDIAC) 20 MG/ML IV SOLN
INTRAVENOUS | Status: DC | PRN
  Administered 2017-01-13: 50 mg via INTRAVENOUS

## 2017-01-13 MED ORDER — DEXAMETHASONE SODIUM PHOSPHATE 10 MG/ML IJ SOLN
INTRAMUSCULAR | Status: DC | PRN
  Administered 2017-01-13: 10 mg via INTRAVENOUS

## 2017-01-13 MED ORDER — IBUPROFEN 600 MG PO TABS: 600.0000 mg | ORAL_TABLET | Freq: Four times a day (QID) | ORAL | Status: DC | PRN

## 2017-01-13 MED ORDER — KETOROLAC TROMETHAMINE 30 MG/ML IJ SOLN: 30.0000 mg | Freq: Four times a day (QID) | INTRAMUSCULAR | Status: DC

## 2017-01-13 MED ORDER — LIDOCAINE-EPINEPHRINE 1 %-1:100000 IJ SOLN
INTRAMUSCULAR | Status: AC
  Filled 2017-01-13: qty 1

## 2017-01-13 MED ORDER — OXYCODONE HCL 5 MG PO TABS
5.0000 mg | ORAL_TABLET | ORAL | Status: DC | PRN
  Administered 2017-01-13 (×2): 5 mg via ORAL
  Filled 2017-01-13 (×2): qty 1

## 2017-01-13 MED ORDER — ONDANSETRON HCL 4 MG/2ML IJ SOLN
INTRAMUSCULAR | Status: AC
  Filled 2017-01-13: qty 2

## 2017-01-13 MED ORDER — HYDROMORPHONE HCL 1 MG/ML IJ SOLN
INTRAMUSCULAR | Status: AC
  Administered 2017-01-13: 0.5 mg via INTRAVENOUS
  Filled 2017-01-13: qty 1

## 2017-01-13 MED ORDER — ONDANSETRON HCL 4 MG/2ML IJ SOLN
INTRAMUSCULAR | Status: DC | PRN
  Administered 2017-01-13: 4 mg via INTRAVENOUS

## 2017-01-13 MED ORDER — FENTANYL CITRATE (PF) 250 MCG/5ML IJ SOLN
INTRAMUSCULAR | Status: AC
  Filled 2017-01-13: qty 5

## 2017-01-13 MED ORDER — ACETAMINOPHEN 160 MG/5ML PO SOLN
975.0000 mg | Freq: Once | ORAL | Status: AC
  Administered 2017-01-13: 975 mg via ORAL

## 2017-01-13 MED ORDER — DIPHENHYDRAMINE HCL 12.5 MG/5ML PO ELIX
12.5000 mg | ORAL_SOLUTION | Freq: Four times a day (QID) | ORAL | Status: DC | PRN
  Filled 2017-01-13: qty 5

## 2017-01-13 MED ORDER — OXYCODONE-ACETAMINOPHEN 5-325 MG PO TABS: 1.0000 | ORAL_TABLET | Freq: Four times a day (QID) | ORAL | 0 refills | Status: DC | PRN

## 2017-01-13 MED ORDER — SODIUM CHLORIDE 0.9% FLUSH: 9.0000 mL | INTRAVENOUS | Status: DC | PRN

## 2017-01-13 MED ORDER — DEXTROSE IN LACTATED RINGERS 5 % IV SOLN
INTRAVENOUS | Status: DC
  Administered 2017-01-13: 17:00:00 via INTRAVENOUS

## 2017-01-13 MED ORDER — LIDOCAINE-EPINEPHRINE 1 %-1:100000 IJ SOLN
INTRAMUSCULAR | Status: DC | PRN
  Administered 2017-01-13: 20 mL

## 2017-01-13 MED ORDER — SUGAMMADEX SODIUM 200 MG/2ML IV SOLN
INTRAVENOUS | Status: DC | PRN
  Administered 2017-01-13: 200 mg via INTRAVENOUS

## 2017-01-13 MED ORDER — SCOPOLAMINE 1 MG/3DAYS TD PT72
1.0000 | MEDICATED_PATCH | Freq: Once | TRANSDERMAL | Status: DC
  Administered 2017-01-13: 1.5 mg via TRANSDERMAL

## 2017-01-13 MED ORDER — MENTHOL 3 MG MT LOZG
1.0000 | LOZENGE | OROMUCOSAL | Status: DC | PRN
  Filled 2017-01-13: qty 9

## 2017-01-13 MED ORDER — MIDAZOLAM HCL 2 MG/2ML IJ SOLN
INTRAMUSCULAR | Status: AC
  Filled 2017-01-13: qty 2

## 2017-01-13 MED ORDER — CEFAZOLIN SODIUM-DEXTROSE 2-4 GM/100ML-% IV SOLN
2.0000 g | INTRAVENOUS | Status: AC
  Administered 2017-01-13: 2 g via INTRAVENOUS
  Filled 2017-01-13: qty 100

## 2017-01-13 MED ORDER — LIDOCAINE HCL (CARDIAC) 20 MG/ML IV SOLN
INTRAVENOUS | Status: AC
  Filled 2017-01-13: qty 5

## 2017-01-13 MED ORDER — PROPOFOL 10 MG/ML IV BOLUS
INTRAVENOUS | Status: AC
  Filled 2017-01-13: qty 20

## 2017-01-13 MED ORDER — HYDROMORPHONE HCL 1 MG/ML IJ SOLN
0.2500 mg | INTRAMUSCULAR | Status: DC | PRN
  Administered 2017-01-13 (×3): 0.5 mg via INTRAVENOUS

## 2017-01-13 MED ORDER — ESTRADIOL 0.1 MG/GM VA CREA
TOPICAL_CREAM | VAGINAL | Status: DC | PRN
  Administered 2017-01-13: 1 via VAGINAL

## 2017-01-13 MED ORDER — FENTANYL CITRATE (PF) 100 MCG/2ML IJ SOLN
INTRAMUSCULAR | Status: DC | PRN
  Administered 2017-01-13 (×3): 50 ug via INTRAVENOUS
  Administered 2017-01-13: 100 ug via INTRAVENOUS

## 2017-01-13 MED ORDER — PROPOFOL 10 MG/ML IV BOLUS
INTRAVENOUS | Status: DC | PRN
  Administered 2017-01-13: 150 mg via INTRAVENOUS

## 2017-01-13 MED ORDER — DEXAMETHASONE SODIUM PHOSPHATE 10 MG/ML IJ SOLN
INTRAMUSCULAR | Status: AC
  Filled 2017-01-13: qty 1

## 2017-01-13 MED ORDER — LACTATED RINGERS IV SOLN: INTRAVENOUS | Status: DC

## 2017-01-13 MED ORDER — NALOXONE HCL 0.4 MG/ML IJ SOLN: 0.4000 mg | INTRAMUSCULAR | Status: DC | PRN

## 2017-01-13 MED ORDER — DIPHENHYDRAMINE HCL 50 MG/ML IJ SOLN: 12.5000 mg | Freq: Four times a day (QID) | INTRAMUSCULAR | Status: DC | PRN

## 2017-01-13 MED ORDER — SCOPOLAMINE 1 MG/3DAYS TD PT72
MEDICATED_PATCH | TRANSDERMAL | Status: AC
  Administered 2017-01-13: 1.5 mg via TRANSDERMAL
  Filled 2017-01-13: qty 1

## 2017-01-13 MED ORDER — HYDROMORPHONE HCL 1 MG/ML IJ SOLN
INTRAMUSCULAR | Status: AC
  Filled 2017-01-13: qty 1

## 2017-01-13 MED ORDER — ACETAMINOPHEN 160 MG/5ML PO SOLN
ORAL | Status: AC
  Administered 2017-01-13: 975 mg via ORAL
  Filled 2017-01-13: qty 40.6

## 2017-01-13 MED ORDER — ROCURONIUM BROMIDE 100 MG/10ML IV SOLN
INTRAVENOUS | Status: AC
  Filled 2017-01-13: qty 1

## 2017-01-13 MED ORDER — METOCLOPRAMIDE HCL 5 MG/ML IJ SOLN
INTRAMUSCULAR | Status: DC | PRN
  Administered 2017-01-13: 10 mg via INTRAVENOUS

## 2017-01-13 MED ORDER — HYDROCHLOROTHIAZIDE 25 MG PO TABS
25.0000 mg | ORAL_TABLET | Freq: Every day | ORAL | 3 refills | Status: DC
Start: 2017-01-13 — End: 2017-05-22

## 2017-01-13 MED ORDER — ROCURONIUM BROMIDE 100 MG/10ML IV SOLN
INTRAVENOUS | Status: DC | PRN
  Administered 2017-01-13: 50 mg via INTRAVENOUS

## 2017-01-13 MED ORDER — CEFAZOLIN SODIUM-DEXTROSE 2-3 GM-%(50ML) IV SOLR
INTRAVENOUS | Status: AC
  Filled 2017-01-13: qty 50

## 2017-01-13 MED ORDER — HYDROMORPHONE 1 MG/ML IV SOLN
INTRAVENOUS | Status: DC
  Administered 2017-01-13: 10:00:00 via INTRAVENOUS
  Administered 2017-01-13: 1.4 mL via INTRAVENOUS
  Administered 2017-01-13: 0.4 mL via INTRAVENOUS
  Filled 2017-01-13: qty 25

## 2017-01-13 MED ORDER — LACTATED RINGERS IV SOLN
INTRAVENOUS | Status: DC
  Administered 2017-01-13 (×3): via INTRAVENOUS

## 2017-01-13 MED ORDER — KETOROLAC TROMETHAMINE 30 MG/ML IJ SOLN
30.0000 mg | Freq: Four times a day (QID) | INTRAMUSCULAR | Status: DC
  Administered 2017-01-13: 30 mg via INTRAVENOUS
  Filled 2017-01-13: qty 1

## 2017-01-13 MED ORDER — KETOROLAC TROMETHAMINE 30 MG/ML IJ SOLN: 30.0000 mg | Freq: Once | INTRAMUSCULAR | Status: DC

## 2017-01-13 SURGICAL SUPPLY — 27 items
CANISTER SUCT 3000ML PPV (MISCELLANEOUS) ×2 IMPLANT
CLOTH BEACON ORANGE TIMEOUT ST (SAFETY) ×2 IMPLANT
CONT PATH 16OZ SNAP LID 3702 (MISCELLANEOUS) ×1 IMPLANT
DECANTER SPIKE VIAL GLASS SM (MISCELLANEOUS) IMPLANT
GAUZE PACKING 2X5 YD STRL (GAUZE/BANDAGES/DRESSINGS) ×1 IMPLANT
GLOVE BIOGEL PI IND STRL 6.5 (GLOVE) ×1 IMPLANT
GLOVE BIOGEL PI IND STRL 7.0 (GLOVE) ×2 IMPLANT
GLOVE BIOGEL PI INDICATOR 6.5 (GLOVE)
GLOVE BIOGEL PI INDICATOR 7.0 (GLOVE) ×2
GLOVE ECLIPSE 7.0 STRL STRAW (GLOVE) ×4 IMPLANT
GOWN STRL REUS W/TWL LRG LVL3 (GOWN DISPOSABLE) ×10 IMPLANT
NDL SPNL 18GX3.5 QUINCKE PK (NEEDLE) ×1 IMPLANT
NEEDLE HYPO 22GX1.5 SAFETY (NEEDLE) IMPLANT
NEEDLE SPNL 18GX3.5 QUINCKE PK (NEEDLE) ×2 IMPLANT
NS IRRIG 1000ML POUR BTL (IV SOLUTION) ×2 IMPLANT
PACK TRENDGUARD 600 HYBRD PROC (MISCELLANEOUS) IMPLANT
PACK VAGINAL WOMENS (CUSTOM PROCEDURE TRAY) ×2 IMPLANT
PAD OB MATERNITY 4.3X12.25 (PERSONAL CARE ITEMS) ×2 IMPLANT
SUT VIC AB 0 CT1 18XCR BRD8 (SUTURE) ×3 IMPLANT
SUT VIC AB 0 CT1 27 (SUTURE) ×4
SUT VIC AB 0 CT1 27XBRD ANBCTR (SUTURE) ×2 IMPLANT
SUT VIC AB 0 CT1 8-18 (SUTURE) ×4
SUT VICRYL 0 TIES 12 18 (SUTURE) ×2 IMPLANT
SYR 20CC LL (SYRINGE) ×2 IMPLANT
TOWEL OR 17X24 6PK STRL BLUE (TOWEL DISPOSABLE) ×4 IMPLANT
TRAY FOLEY CATH SILVER 14FR (SET/KITS/TRAYS/PACK) ×2 IMPLANT
TRENDGUARD 600 HYBRID PROC PK (MISCELLANEOUS)

## 2017-01-13 NOTE — Progress Notes (Signed)
Discharge patient to car via wheelchair with family members, escorted by Nicholaus Bloom, NT.  Patient had no complains at the time of discharge.

## 2017-01-13 NOTE — Discharge Instructions (Signed)
Vaginal Hysterectomy, Care After °Refer to this sheet in the next few weeks. These instructions provide you with information about caring for yourself after your procedure. Your health care provider may also give you more specific instructions. Your treatment has been planned according to current medical practices, but problems sometimes occur. Call your health care provider if you have any problems or questions after your procedure. °What can I expect after the procedure? °After the procedure, it is common to have: °· Pain. °· Soreness and numbness in your incision areas. °· Vaginal bleeding and discharge. °· Constipation. °· Temporary problems emptying the bladder. °· Feelings of sadness or other emotions. ° °Follow these instructions at home: °Medicines °· Take over-the-counter and prescription medicines only as told by your health care provider. °· If you were prescribed an antibiotic medicine, take it as told by your health care provider. Do not stop taking the antibiotic even if you start to feel better. °· Do not drive or operate heavy machinery while taking prescription pain medicine. °Activity °· Return to your normal activities as told by your health care provider. Ask your health care provider what activities are safe for you. °· Get regular exercise as told by your health care provider. You may be told to take short walks every day and go farther each time. °· Do not lift anything that is heavier than 10 lb (4.5 kg). °General instructions ° °· Do not put anything in your vagina for 6 weeks after your surgery or as told by your health care provider. This includes tampons and douches. °· Do not have sex until your health care provider says you can. °· Do not take baths, swim, or use a hot tub until your health care provider approves. °· Drink enough fluid to keep your urine clear or pale yellow. °· Do not drive for 24 hours if you were given a sedative. °· Keep all follow-up visits as told by your health  care provider. This is important. °Contact a health care provider if: °· Your pain medicine is not helping. °· You have a fever. °· You have redness, swelling, or pain at your incision site. °· You have blood, pus, or a bad-smelling discharge from your vagina. °· You continue to have difficulty urinating. °Get help right away if: °· You have severe abdominal or back pain. °· You have heavy bleeding from your vagina. °· You have chest pain or shortness of breath. °This information is not intended to replace advice given to you by your health care provider. Make sure you discuss any questions you have with your health care provider. °Document Released: 04/16/2015 Document Revised: 05/31/2015 Document Reviewed: 01/07/2015 °Elsevier Interactive Patient Education © 2018 [employer] ° °

## 2017-01-13 NOTE — Op Note (Signed)
Preoperative diagnosis: menorrhagia  Postoperative diagnosis: Same  Procedure: Transvaginal hysterectomy   Surgeon: Standley Dakins. Kennon Rounds, M.D.  Assistant: Lavonia Drafts, MD  Anesthesia: Freeman Caldron, MD, MD  Findings: uterine scar from prior C-section.  Estimated blood loss: 150 cc  Specimen: Uterus to pathology  Reason for procedure: Patient had long h/o bleeding treated with OC's until her BP would not tolerate. Tried and failed IUD, desired definitive treatment.  Risks of  hysterectomy reviewed.  Risks include but are not limited to bleeding, infection, injury to surrounding structures, including bowel, bladder and ureters, blood clots, and death.  Likelihood of success of surgery is high.   Procedure: Patient was taken to the OR where she was placed in dorsal lithotomy in [place]. She was prepped and draped in the usual sterile fashion. A timeout was performed. The patient received 2 g of Ancef prior to procedure. The patient had SCDs in place.  A speculum was placed inside the vagina. The cervix was visualized and grasped with 2 doublle-tooth tenacula. 20 cc of 1% lidocaine with epinephrine were injected paracervically. A knife was used to make a circumferential incision around the vagina. An opened sponge was used to dissect the vagina off the cervix. The posterior peritoneum was entered sharply with Mayo scissors. The posterior peritoneum was tagged to the vaginal cuff with a single stitch. The anterior peritoneal cavity was not entered. Careful dissection of the bladder off the underlying cervix pushed cephalad to avoid injury. A Heaney clamp was used to clamp first the left uterosacral ligament and cardinal which was then cut and Haney suture ligated with 0 Vicryl stitch, the stitch was held. Similarly the right uterosacral ligament was clamped cut and suture ligated. Sequential bites up the broad to the uterine arteries were taken until the tubo-ovarian pedicles  were encountered. A finger was placed posteriorly and felt and an incision made over this to enter the anterior peritoneum. The uterus was then inverted and the left utero-ovarian pedicle grasped with a Heaney clamp. The right utero-ovarian pedicle was similarly grasped with the Heaney clamp. A free tie followed by suture ligature for hemostasis. Inspection of all pedicles revealed adequate hemostasis except at the left uterine artery which was ligated following placement of a right angle.  The vagina was closed with 0 Vicryl suture in a locked running fashion with care taken to incorporate the uterosacral pedicles. Excellent hemostasis was noted at the end of the case. The vaginal cuff was inspected there was minimal bleeding noted.  A Foley catheter is placed inside her bladder. NO urine noted. The cuff was then re-opened and bladder inspected visually and felt to be clear. Foley replaced and clear, yellow urine was noted. Cuff re-closed with running locked suture.  Vaginal pcking with estrace cream placed. All instrument needle and lap counts were correct x 2. Patient was awakened taken to recovery room in stable condition.  Donnamae Jude, MD 01/13/2017, 8:45 AM

## 2017-01-13 NOTE — Transfer of Care (Signed)
Immediate Anesthesia Transfer of Care Note  Patient: Kimberly [name]  Procedure(s) Performed: HYSTERECTOMY VAGINAL (N/A Vagina )  Patient Location: PACU  Anesthesia Type:General  Level of Consciousness: awake  Airway & Oxygen Therapy: Patient Spontanous Breathing and Patient connected to nasal cannula oxygen  Post-op Assessment: Report given to RN and Post -op Vital signs reviewed and stable  Post vital signs: Reviewed and stable  Last Vitals:  Vitals:   01/13/17 0625 01/13/17 0851  BP: (!) 137/98 (!) 141/98  Pulse: 76 84  Resp: 16 11  Temp: 36.6 C 36.7 C  SpO2: 100% 100%    Last Pain:  Vitals:   01/13/17 0625  TempSrc: Oral      Patients Stated Pain Goal: 4 (25/63/89 3734)  Complications: No apparent anesthesia complications

## 2017-01-13 NOTE — Anesthesia Postprocedure Evaluation (Signed)
Anesthesia Post Note  Patient: Kimberly [name]  Procedure(s) Performed: HYSTERECTOMY VAGINAL (N/A Vagina )     Patient location during evaluation: PACU Anesthesia Type: General Level of consciousness: awake and alert Pain management: pain level controlled Vital Signs Assessment: post-procedure vital signs reviewed and stable Respiratory status: spontaneous breathing, nonlabored ventilation, respiratory function stable and patient connected to nasal cannula oxygen Cardiovascular status: blood pressure returned to baseline and stable Postop Assessment: no apparent nausea or vomiting Anesthetic complications: no    Last Vitals:  Vitals:   01/13/17 1236 01/13/17 1340  BP: 126/86 121/77  Pulse: 96 97  Resp: 15 16  Temp:  36.6 C  SpO2: 95% 97%    Last Pain:  Vitals:   01/13/17 1355  TempSrc:   PainSc: 2    Pain Goal: Patients Stated Pain Goal: 3 (01/13/17 1136)               Lyndle Herrlich EDWARD

## 2017-01-13 NOTE — Interval H&P Note (Signed)
History and Physical Interval Note:  01/13/2017 7:13 AM  Kimberly [name]  has presented today for surgery, with the diagnosis of Menorrhagia  The various methods of treatment have been discussed with the patient and family. After consideration of risks, benefits and other options for treatment, the patient has consented to  Procedure(s): HYSTERECTOMY VAGINAL (N/A) as a surgical intervention .  The patient's history has been reviewed, patient examined, no change in status, stable for surgery.  I have reviewed the patient's chart and labs.  Questions were answered to the patient's satisfaction.     Donnamae Jude

## 2017-01-13 NOTE — Anesthesia Procedure Notes (Signed)
Procedure Name: Intubation Date/Time: 01/13/2017 7:31 AM Performed by: Purvis Kilts, CRNA Pre-anesthesia Checklist: Patient identified, Emergency Drugs available, Suction available, Patient being monitored and Timeout performed Patient Re-evaluated:Patient Re-evaluated prior to induction Oxygen Delivery Method: Circle system utilized Preoxygenation: Pre-oxygenation with 100% oxygen Induction Type: IV induction Ventilation: Mask ventilation without difficulty Laryngoscope Size: Mac and 3 Grade View: Grade I Tube type: Oral Tube size: 7.0 mm Number of attempts: 1 Airway Equipment and Method: Stylet Placement Confirmation: ETT inserted through vocal cords under direct vision,  positive ETCO2 and breath sounds checked- equal and bilateral Secured at: 22 cm Tube secured with: Tape Dental Injury: Teeth and Oropharynx as per pre-operative assessment

## 2017-01-13 NOTE — Progress Notes (Signed)
Discharge instructions reviewed with patient and family members, questions asked and answered.

## 2017-01-13 NOTE — Discharge Summary (Signed)
Physician Discharge Summary  Patient ID: Kimberly [name] MRN: [id number] DOB/AGE: 45-Dec-1972 45 y.o.  Admit date: 01/13/2017 Discharge date:   Admission Diagnoses:  Principal Problem:   Menorrhagia with regular cycle Active Problems:   S/P vaginal hysterectomy   Discharge Diagnoses:  Same  Past Medical History:  Diagnosis Date  . Anorectal fistula   . Anxiety   . Arthritis    neck, hips  . Chronic headaches    migraines  . 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 Irritable bowel syndrome   . PONV (postoperative nausea and vomiting)    slow to wake up  . Wears glasses     Surgeries: Procedure(s): HYSTERECTOMY VAGINAL on 01/13/2017   Consultants: None  Discharged Condition: Stable  Hospital Course: Kimberly [name] is an 47 y.o. female G1P1 who was admitted 01/13/2017 with Menorrhagia with regular cycle. She was brought to the operating room on 01/13/2017 and underwent the above named procedures.   She was given perioperative antibiotics:  Anti-infectives (From admission, onward)   Start     Dose/Rate Route Frequency Ordered Stop   01/13/17 0611  ceFAZolin (ANCEF) 2-3 GM-%(50ML) IVPB SOLR    Comments:  Boykin Reaper   : cabinet override      01/13/17 0611 01/13/17 1814   01/13/17 0314  ceFAZolin (ANCEF) IVPB 2g/100 mL premix     2 g 200 mL/hr over 30 Minutes Intravenous On call to O.R. 01/13/17 3716 01/13/17 9678     She was given sequential compression devices, early ambulation, and chemoprophylaxis for DVT prophylaxis. Postoperatively she was voiding, tolerating po, had normal vitals signs and stable hemoglobin x 12 hours.  She was deemed stable for discharge.    Recent vital signs:  Vitals:   01/13/17 1707 01/13/17 1736 01/13/17 1756 01/13/17 1952  BP: (!) 149/95 (!) 136/93  (!) 147/92  Pulse: (!) 110 97  98  Resp: 16   16  Temp: 98.1 F (36.7 C)   98.3 F (36.8 C)  TempSrc: Oral   Oral  SpO2: 97%  99% 97%   Weight:      Height:       Discharge exam: Physical Examination: General appearance - alert, well appearing, and in no distress Chest - normal effort Heart - normal rate and regular rhythm Abdomen - soft appropriately tender Extremities - Homan's sign negative bilaterally  Recent laboratory studies:  Results for orders placed or performed during the hospital encounter of 01/13/17  Pregnancy, urine  Result Value Ref Range   Preg Test, Ur NEGATIVE NEGATIVE  CBC  Result Value Ref Range   WBC 13.4 (H) 4.0 - 10.5 K/uL   RBC 3.80 (L) 3.87 - 5.11 MIL/uL   Hemoglobin 12.4 12.0 - 15.0 g/dL   HCT 37.8 36.0 - 46.0 %   MCV 99.5 78.0 - 100.0 fL   MCH 32.6 26.0 - 34.0 pg   MCHC 32.8 30.0 - 36.0 g/dL   RDW 13.2 11.5 - 15.5 %   Platelets 220 150 - 400 K/uL    Discharge Medications:   Allergies as of 01/13/2017      Reactions   Adhesive [tape] Rash      Medication List    TAKE these medications   EMGALITY 120 MG/ML Soaj Generic drug:  Galcanezumab-gnlm Inject 120 mg into the skin every 30 (thirty) days.   hydrochlorothiazide 25 MG tablet Commonly known as:  HYDRODIURIL Take 1 tablet (  25 mg total) by mouth daily.   ibuprofen 200 MG tablet Commonly known as:  ADVIL,MOTRIN Take 800 mg by mouth daily as needed for headache or moderate pain.   naproxen 500 MG tablet Commonly known as:  NAPROSYN Take 1 tablet (500 mg total) by mouth 2 (two) times daily with a meal.   ondansetron 8 MG tablet Commonly known as:  ZOFRAN Take 1 tablet (8 mg total) by mouth every 8 (eight) hours as needed for nausea or vomiting.   oxyCODONE-acetaminophen 5-325 MG tablet Commonly known as:  PERCOCET/ROXICET Take 1-2 tablets by mouth every 6 (six) hours as needed.   SUMAtriptan 100 MG tablet Commonly known as:  IMITREX Take 1 tablet (100 mg total) by mouth every 2 (two) hours as needed for migraine. As needed   valACYclovir 1000 MG tablet Commonly known as:  VALTREX Take 1 tablet (1,000 mg total)  by mouth 1 day or 1 dose.       Diagnostic Studies: No results found.  Disposition: 01-Home or Self Care  Discharge Instructions    Call MD for:  persistant nausea and vomiting   Complete by:  As directed    Call MD for:  redness, tenderness, or signs of infection (pain, swelling, redness, odor or green/yellow discharge around incision site)   Complete by:  As directed    Call MD for:  severe uncontrolled pain   Complete by:  As directed    Call MD for:  temperature >100.4   Complete by:  As directed    Diet - low sodium heart healthy   Complete by:  As directed    Driving Restrictions   Complete by:  As directed    None while needing narcotic pain meds   Increase activity slowly   Complete by:  As directed    Lifting restrictions   Complete by:  As directed    Nothing > 20 lbs x 4-6 wks   No wound care   Complete by:  As directed    Other Restrictions   Complete by:  As directed    Nothing in the vagina x 6 wks, Showers only, no baths   Sexual Activity Restrictions   Complete by:  As directed    None x 6 wks      Follow-up [place] [facility] [employer] at [facility] In 2 weeks.   Specialty:  Obstetrics and Gynecology Why:  postop check, they will call you with an appointment Contact information: [address] [place] [phone]           Signed: Donnamae Jude 01/13/2017, 8:27 PM

## 2017-01-14 ENCOUNTER — Encounter (HOSPITAL_COMMUNITY): Payer: Self-pay | Admitting: Family Medicine

## 2017-01-21 ENCOUNTER — Other Ambulatory Visit: Payer: Self-pay | Admitting: Family Medicine

## 2017-01-21 MED ORDER — OXYCODONE-ACETAMINOPHEN 5-325 MG PO TABS: 1.0000 | ORAL_TABLET | Freq: Four times a day (QID) | ORAL | 0 refills | Status: DC | PRN

## 2017-01-25 ENCOUNTER — Other Ambulatory Visit: Payer: Self-pay | Admitting: Family Medicine

## 2017-01-25 DIAGNOSIS — R300 Dysuria: Secondary | ICD-10-CM

## 2017-01-25 MED ORDER — PHENAZOPYRIDINE HCL 200 MG PO TABS: 200.0000 mg | ORAL_TABLET | Freq: Three times a day (TID) | ORAL | 0 refills | Status: DC | PRN

## 2017-01-25 MED ORDER — SULFAMETHOXAZOLE-TRIMETHOPRIM 800-160 MG PO TABS: 1.0000 | ORAL_TABLET | Freq: Two times a day (BID) | ORAL | 0 refills | Status: DC

## 2017-01-25 NOTE — Progress Notes (Signed)
Pt. Is s/p TVH--c/o dysuria. Abx + pyridium sent in

## 2017-01-26 ENCOUNTER — Encounter (HOSPITAL_COMMUNITY): Payer: Self-pay

## 2017-01-30 ENCOUNTER — Other Ambulatory Visit (INDEPENDENT_AMBULATORY_CARE_PROVIDER_SITE_OTHER): Payer: BLUE CROSS/BLUE SHIELD | Admitting: *Deleted

## 2017-01-30 DIAGNOSIS — R300 Dysuria: Secondary | ICD-10-CM

## 2017-01-30 MED ORDER — FLUCONAZOLE 150 MG PO TABS: 150.0000 mg | ORAL_TABLET | ORAL | 3 refills | Status: DC

## 2017-01-30 NOTE — Progress Notes (Signed)
I have reviewed the chart and agree with nursing staff's documentation of this patient's encounter.  Verita Schneiders, MD 01/30/2017 11:33 AM

## 2017-01-30 NOTE — Progress Notes (Signed)
SUBJECTIVE: Kimberly [name] is a 47 y.o. female who complains of urinary frequency, urgency and dysuria x 7 days, without flank pain, fever, chills, or abnormal vaginal discharge or bleeding.  Finished 3 days of Bactrim 01/29/17.  OBJECTIVE: Appears well, in no apparent distress.  Vital signs are normal. Urine dipstick shows positive for leukocytes.    ASSESSMENT: Dysuria  PLAN: Treatment per orders.  Call or return to clinic prn if these symptoms worsen or fail to improve as anticipated.

## 2017-02-02 MED ORDER — FLAVOXATE HCL 100 MG PO TABS: 100.0000 mg | ORAL_TABLET | Freq: Three times a day (TID) | ORAL | 1 refills | Status: DC | PRN

## 2017-02-02 NOTE — Addendum Note (Signed)
Addended by: Gretchen Short on: 02/02/2017 11:19 AM   Modules accepted: Orders

## 2017-02-03 LAB — CULTURE, URINE COMPREHENSIVE

## 2017-02-09 ENCOUNTER — Encounter: Payer: Self-pay | Admitting: Family Medicine

## 2017-02-09 ENCOUNTER — Ambulatory Visit (INDEPENDENT_AMBULATORY_CARE_PROVIDER_SITE_OTHER): Payer: BLUE CROSS/BLUE SHIELD | Admitting: Family Medicine

## 2017-02-09 VITALS — BP 131/80 | HR 90 | Wt 156.4 lb

## 2017-02-09 DIAGNOSIS — Z09 Encounter for follow-up examination after completed treatment for conditions other than malignant neoplasm: Secondary | ICD-10-CM

## 2017-02-09 NOTE — Progress Notes (Signed)
   Subjective:    Patient ID: Kimberly [name] is a 47 y.o. female presenting with POSTOP (Hysterectomy)  on 02/09/2017  HPI: Here for post op check. Had TVH on 01/14/16. Feels well. No vaginal discharge or bleeding. Notes some bladder pain. Recent course of Abx with Pyridium. Pain persisted. Then had urine culture which was negative.  Review of Systems  Constitutional: Negative for chills and fever.  Respiratory: Negative for shortness of breath.   Cardiovascular: Negative for chest pain.  Gastrointestinal: Negative for abdominal pain, nausea and vomiting.  Genitourinary: Negative for dysuria.  Skin: Negative for rash.      Objective:    BP 131/80   Pulse 90   Wt 156 lb 6.4 oz (70.9 kg)   LMP 01/09/2017   BMI 30.54 kg/m  Physical Exam  Constitutional: She is oriented to person, place, and time. She appears well-developed and well-nourished. No distress.  HENT:  Head: Normocephalic and atraumatic.  Eyes: No scleral icterus.  Neck: Neck supple.  Cardiovascular: Normal rate.  Pulmonary/Chest: Effort normal.  Abdominal: Soft.  Genitourinary:  Genitourinary Comments: Vagina is without dischage, suture noted at cuff.  Neurological: She is alert and oriented to person, place, and time.  Skin: Skin is warm and dry.  Psychiatric: She has a normal mood and affect.        Assessment & Plan:  Postop check - doing well--suspect bladder is healing with careful dissection and previous section--return if not improving. May travel. No sex x 6 wks total.  Return in about 2 weeks (around 02/23/2017).  Donnamae Jude 02/09/2017 3:46 PM

## 2017-02-18 ENCOUNTER — Encounter: Payer: Self-pay | Admitting: *Deleted

## 2017-02-19 ENCOUNTER — Encounter: Payer: Self-pay | Admitting: Family Medicine

## 2017-02-19 ENCOUNTER — Ambulatory Visit (INDEPENDENT_AMBULATORY_CARE_PROVIDER_SITE_OTHER): Payer: BLUE CROSS/BLUE SHIELD | Admitting: Family Medicine

## 2017-02-19 VITALS — BP 161/98 | HR 71 | Wt 158.9 lb

## 2017-02-19 DIAGNOSIS — N99820 Postprocedural hemorrhage and hematoma of a genitourinary system organ or structure following a genitourinary system procedure: Secondary | ICD-10-CM

## 2017-02-19 NOTE — Progress Notes (Signed)
   Subjective:    Patient ID: Kimberly [name] is a 47 y.o. female presenting with No chief complaint on file.  on 02/19/2017  HPI: Noted some bleeding this am. Seems to be dark but some was bright red this am. No fever/chills, no vomiting. Some nausea, but migraine headache x 3 days. Continues to have bladder pain which is slowly improving.  Review of Systems  Constitutional: Negative for chills and fever.  Respiratory: Negative for shortness of breath.   Cardiovascular: Negative for chest pain.  Gastrointestinal: Negative for abdominal pain, nausea and vomiting.  Genitourinary: Negative for dysuria.  Skin: Negative for rash.      Objective:    BP (!) 161/98   Pulse 71   Wt 158 lb 14.4 oz (72.1 kg)   LMP 01/09/2017 (Exact Date)   BMI 31.03 kg/m  Physical Exam  Constitutional: She is oriented to person, place, and time. She appears well-developed and well-nourished. No distress.  HENT:  Head: Normocephalic and atraumatic.  Eyes: No scleral icterus.  Neck: Neck supple.  Cardiovascular: Normal rate.  Pulmonary/Chest: Effort normal.  Abdominal: Soft.  Genitourinary:  Genitourinary Comments: Vaginal cuff is well healed--sutures are red and removed with a swab. There is no mass no any tenderness at top of the cuff.  Neurological: She is alert and oriented to person, place, and time.  Skin: Skin is warm and dry.  Psychiatric: She has a normal mood and affect.        Assessment & Plan:  Postoperative vaginal bleeding following genitourinary procedure - minimal and cuff is intact, suspect it was suture pulling through--she is reassured.   Total face-to-face time with patient: 10 minutes. Over 50% of encounter was spent on counseling and coordination of care. Return if symptoms worsen or fail to improve.  Donnamae Jude 02/19/2017 1:22 PM

## 2017-03-18 ENCOUNTER — Encounter: Payer: Self-pay | Admitting: Physician Assistant

## 2017-03-18 ENCOUNTER — Telehealth: Payer: Self-pay | Admitting: Physician Assistant

## 2017-03-18 MED ORDER — ALPRAZOLAM 0.5 MG PO TABS: 0.5000 mg | ORAL_TABLET | Freq: Every day | ORAL | 0 refills | Status: DC | PRN

## 2017-03-18 NOTE — Telephone Encounter (Signed)
Pt requesting refill of Xanax.  She last got rx in 2016 and uses it only very occasionally.  She is having more anxiety related to finalizing her divorce and financial implications.   Will send in rx.  RTC as scheduled.

## 2017-05-22 ENCOUNTER — Other Ambulatory Visit: Payer: Self-pay | Admitting: Family Medicine

## 2017-05-22 DIAGNOSIS — I10 Essential (primary) hypertension: Secondary | ICD-10-CM

## 2017-05-22 MED ORDER — OLMESARTAN MEDOXOMIL-HCTZ 20-12.5 MG PO TABS
1.0000 | ORAL_TABLET | Freq: Every day | ORAL | 2 refills | Status: DC
Start: 2017-05-22 — End: 2019-06-16

## 2017-05-22 NOTE — Progress Notes (Signed)
Patient sends in multiple BPs which are all elevated, some as high as 160/115 but most in the 130-150/90-100. Will change to combo pill with ARB and HCTZ. She is instructed to stop her HCTZ alone and begin this. Has h/o ACE-I intolerance due to cough and metoprolol intolerance due to fatigue.

## 2017-06-18 ENCOUNTER — Encounter: Payer: Self-pay | Admitting: Radiology

## 2017-06-25 MED FILL — NAPROXEN 500 MG TABLET: 500 | 45 days supply | Qty: 90 | Fill #1

## 2017-06-25 MED FILL — SUMATRIPTAN SUCC 100 MG TAB: 100 | 30 days supply | Qty: 18 | Fill #1

## 2017-06-25 MED FILL — ONDANSETRON HCL 8 MG TABLET: 8 | 13 days supply | Qty: 40 | Fill #1

## 2017-07-03 ENCOUNTER — Other Ambulatory Visit: Payer: Self-pay | Admitting: Physician Assistant

## 2017-07-12 ENCOUNTER — Other Ambulatory Visit: Payer: Self-pay | Admitting: Family Medicine

## 2017-07-12 MED ORDER — PREDNISONE 10 MG (21) PO TBPK: ORAL_TABLET | ORAL | 0 refills | Status: DC

## 2017-07-12 NOTE — Progress Notes (Signed)
Patient calls with poison ivy. Almost always needs steroids to rid. Rx sent in.

## 2017-08-17 MED FILL — valACYclovir HCL 1 GM TABS: 1 | 90 days supply | Qty: 90 | Fill #0

## 2017-10-26 ENCOUNTER — Other Ambulatory Visit: Payer: Self-pay | Admitting: Physician Assistant

## 2017-11-03 ENCOUNTER — Ambulatory Visit (INDEPENDENT_AMBULATORY_CARE_PROVIDER_SITE_OTHER): Payer: BLUE CROSS/BLUE SHIELD | Admitting: Family Medicine

## 2017-11-03 ENCOUNTER — Encounter: Payer: Self-pay | Admitting: Family Medicine

## 2017-11-03 VITALS — BP 106/68 | HR 72 | Wt 148.6 lb

## 2017-11-03 DIAGNOSIS — I10 Essential (primary) hypertension: Secondary | ICD-10-CM

## 2017-11-03 DIAGNOSIS — Z01419 Encounter for gynecological examination (general) (routine) without abnormal findings: Secondary | ICD-10-CM

## 2017-11-03 DIAGNOSIS — Z23 Encounter for immunization: Secondary | ICD-10-CM

## 2017-11-03 NOTE — Patient Instructions (Signed)
Preventive Care 18-39 Years, Female Preventive care refers to lifestyle choices and visits with your health care provider that can promote health and wellness. What does preventive care include?  A yearly physical exam. This is also called an annual well check.  Dental exams once or twice a year.  Routine eye exams. Ask your health care provider how often you should have your eyes checked.  Personal lifestyle choices, including: ? Daily care of your teeth and gums. ? Regular physical activity. ? Eating a healthy diet. ? Avoiding tobacco and drug use. ? Limiting alcohol use. ? Practicing safe sex. ? Taking vitamin and mineral supplements as recommended by your health care provider. What happens during an annual well check? The services and screenings done by your health care provider during your annual well check will depend on your age, overall health, lifestyle risk factors, and family history of disease. Counseling Your health care provider may ask you questions about your:  Alcohol use.  Tobacco use.  Drug use.  Emotional well-being.  Home and relationship well-being.  Sexual activity.  Eating habits.  Work and work Statistician.  Method of birth control.  Menstrual cycle.  Pregnancy history.  Screening You may have the following tests or measurements:  Height, weight, and BMI.  Diabetes screening. This is done by checking your blood sugar (glucose) after you have not eaten for a while (fasting).  Blood pressure.  Lipid and cholesterol levels. These may be checked every 5 years starting at age 76.  Skin check.  Hepatitis C blood test.  Hepatitis B blood test.  Sexually transmitted disease (STD) testing.  BRCA-related cancer screening. This may be done if you have a family history of breast, ovarian, tubal, or peritoneal cancers.  Pelvic exam and Pap test. This may be done every 3 years starting at age 44. Starting at age 59, this may be done  every 5 years if you have a Pap test in combination with an HPV test.  Discuss your test results, treatment options, and if necessary, the need for more tests with your health care provider. Vaccines Your health care provider may recommend certain vaccines, such as:  Influenza vaccine. This is recommended every year.  Tetanus, diphtheria, and acellular pertussis (Tdap, Td) vaccine. You may need a Td booster every 10 years.  Varicella vaccine. You may need this if you have not been vaccinated.  HPV vaccine. If you are 55 or younger, you may need three doses over 6 months.  Measles, mumps, and rubella (MMR) vaccine. You may need at least one dose of MMR. You may also need a second dose.  Pneumococcal 13-valent conjugate (PCV13) vaccine. You may need this if you have certain conditions and were not previously vaccinated.  Pneumococcal polysaccharide (PPSV23) vaccine. You may need one or two doses if you smoke cigarettes or if you have certain conditions.  Meningococcal vaccine. One dose is recommended if you are age 34-21 years and a first-year college student living in a residence hall, or if you have one of several medical conditions. You may also need additional booster doses.  Hepatitis A vaccine. You may need this if you have certain conditions or if you travel or work in places where you may be exposed to hepatitis A.  Hepatitis B vaccine. You may need this if you have certain conditions or if you travel or work in places where you may be exposed to hepatitis B.  Haemophilus influenzae type b (Hib) vaccine. You may need this  if you have certain risk factors.  Talk to your health care provider about which screenings and vaccines you need and how often you need them. This information is not intended to replace advice given to you by your health care provider. Make sure you discuss any questions you have with your health care provider. Document Released: 02/18/2001 Document Revised:  09/12/2015 Document Reviewed: 10/24/2014 Elsevier Interactive Patient Education  2018 Elsevier Inc.  

## 2017-11-03 NOTE — Assessment & Plan Note (Signed)
BP is normal, has HCTZ will attempt this alone to see if this controls her BP. Will keep combo ARB and HCTZ if needed. Check BPs to see how BP is doing.

## 2017-11-03 NOTE — Progress Notes (Signed)
Mammogram order today   Pap smear 09/02/2016- normal Flu shot today

## 2017-11-03 NOTE — Progress Notes (Signed)
Subjective:     Kimberly [name] is a 47 y.o. female and is here for a comprehensive physical exam. The patient reports problems - none.  Social History   Socioeconomic History  . Marital status: Married    Spouse name: Not on file  . Number of children: 1  . Years of education: Not on file  . Highest education level: Not on file  Occupational History  . Occupation: Psychologist, sport and exercise  Social Needs  . Financial resource strain: Not on file  . Food insecurity:    Worry: Not on file    Inability: Not on file  . Transportation needs:    Medical: Not on file    Non-medical: Not on file  Tobacco Use  . Smoking status: Never Smoker  . Smokeless tobacco: Never Used  Substance and Sexual Activity  . Alcohol use: Yes    Comment: occasional  . Drug use: No  . Sexual activity: Not Currently    Birth control/protection: None  Lifestyle  . Physical activity:    Days per week: Not on file    Minutes per session: Not on file  . Stress: Not on file  Relationships  . Social connections:    Talks on phone: Not on file    Gets together: Not on file    Attends religious service: Not on file    Active member of club or organization: Not on file    Attends meetings of clubs or organizations: Not on file    Relationship status: Not on file  . Intimate partner violence:    Fear of current or ex partner: Not on file    Emotionally abused: Not on file    Physically abused: Not on file    Forced sexual activity: Not on file  Other Topics Concern  . Not on file  Social History Narrative  . Not on file   Health Maintenance  Topic Date Due  . HIV Screening  02/24/1987  . TETANUS/TDAP  02/24/1991  . INFLUENZA VACCINE  08/06/2017  . PAP SMEAR  09/03/2019    The following portions of the patient's history were reviewed and updated as appropriate: allergies, current medications, past family history, past medical history, past social history, past surgical history and problem list.  Review  of Systems Pertinent items noted in HPI and remainder of comprehensive ROS otherwise negative.   Objective:    BP 106/68   Pulse 72   Wt 148 lb 9.6 oz (67.4 kg)   LMP 01/09/2017 (Exact Date)   BMI 29.02 kg/m  General appearance: alert, cooperative and appears stated age Head: Normocephalic, without obvious abnormality, atraumatic Neck: no adenopathy, supple, symmetrical, trachea midline and thyroid not enlarged, symmetric, no tenderness/mass/nodules Lungs: clear to auscultation bilaterally Breasts: normal appearance, no masses or tenderness Heart: regular rate and rhythm, S1, S2 normal, no murmur, click, rub or gallop Abdomen: soft, non-tender; bowel sounds normal; no masses,  no organomegaly Pelvic: external genitalia normal, no adnexal masses or tenderness, uterus surgically absent and vagina normal without discharge Extremities: extremities normal, atraumatic, no cyanosis or edema Pulses: 2+ and symmetric Skin: Skin color, texture, turgor normal. No rashes or lesions Lymph nodes: Cervical, supraclavicular, and axillary nodes normal.    Assessment:    Healthy female exam.      Plan:   Problem List Items Addressed This Visit      Unprioritized   Essential hypertension    BP is normal, has HCTZ will attempt this alone to see  if this controls her BP. Will keep combo ARB and HCTZ if needed. Check BPs to see how BP is doing.       Other Visit Diagnoses    Encounter for gynecological examination without abnormal finding    -  Primary   Relevant Orders   MM DIGITAL SCREENING BILATERAL   Flu Vaccine QUAD 36+ mos IM (Fluarix, Quad PF) (Completed)   TSH   CBC   Comprehensive metabolic panel   Lipid panel   Vitamin D (25 hydroxy)     Return in 1 year (on 11/04/2018).    See After Visit Summary for Counseling Recommendations

## 2017-11-04 LAB — COMPREHENSIVE METABOLIC PANEL
A/G RATIO: 1.2 (ref 1.2–2.2)
ALT: 12 IU/L (ref 0–32)
AST: 21 IU/L (ref 0–40)
Albumin: 3.6 g/dL (ref 3.5–5.5)
Alkaline Phosphatase: 61 IU/L (ref 39–117)
BUN/Creatinine Ratio: 22 (ref 9–23)
BUN: 22 mg/dL (ref 6–24)
Bilirubin Total: <0.2 mg/dL (ref 0.0–1.2)
CO2: 26 mmol/L (ref 20–29)
CREATININE: 1 mg/dL (ref 0.57–1.00)
Calcium: 9 mg/dL (ref 8.7–10.2)
Chloride: 99 mmol/L (ref 96–106)
GFR calc Af Amer: 79 mL/min/{1.73_m2} (ref >59)
GFR, EST NON AFRICAN AMERICAN: 68 mL/min/{1.73_m2} (ref >59)
Globulin, Total: 3.1 g/dL (ref 1.5–4.5)
Glucose: 71 mg/dL (ref 65–99)
Potassium: 3.7 mmol/L (ref 3.5–5.2)
SODIUM: 141 mmol/L (ref 134–144)
Total Protein: 6.7 g/dL (ref 6.0–8.5)

## 2017-11-04 LAB — VITAMIN D 25 HYDROXY (VIT D DEFICIENCY, FRACTURES): Vit D, 25-Hydroxy: 46.1 ng/mL (ref 30.0–100.0)

## 2017-11-04 LAB — CBC
Hematocrit: 36.2 % (ref 34.0–46.6)
Hemoglobin: 12.7 g/dL (ref 11.1–15.9)
MCH: 33.5 pg — AB (ref 26.6–33.0)
MCHC: 35.1 g/dL (ref 31.5–35.7)
MCV: 96 fL (ref 79–97)
PLATELETS: 227 10*3/uL (ref 150–450)
RBC: 3.79 x10E6/uL (ref 3.77–5.28)
RDW: 11.6 % — AB (ref 12.3–15.4)
WBC: 5.4 10*3/uL (ref 3.4–10.8)

## 2017-11-04 LAB — LIPID PANEL
CHOL/HDL RATIO: 3.3 ratio (ref 0.0–4.4)
CHOLESTEROL TOTAL: 157 mg/dL (ref 100–199)
HDL: 48 mg/dL (ref >39)
LDL CALC: 97 mg/dL (ref 0–99)
Triglycerides: 62 mg/dL (ref 0–149)
VLDL CHOLESTEROL CAL: 12 mg/dL (ref 5–40)

## 2017-11-04 LAB — TSH: TSH: 1.32 u[IU]/mL (ref 0.450–4.500)

## 2017-11-05 MED FILL — NAPROXEN 500 MG TABLET: 500 | 45 days supply | Qty: 90 | Fill #2

## 2017-11-05 MED FILL — SUMAtriptan SUCCINATE 100 M: 100 | 30 days supply | Qty: 18 | Fill #2

## 2017-11-13 ENCOUNTER — Encounter: Payer: Self-pay | Admitting: Physician Assistant

## 2017-11-13 ENCOUNTER — Ambulatory Visit: Payer: BLUE CROSS/BLUE SHIELD | Admitting: Physician Assistant

## 2017-11-13 VITALS — BP 119/81 | HR 73 | Wt 148.0 lb

## 2017-11-13 DIAGNOSIS — F411 Generalized anxiety disorder: Secondary | ICD-10-CM | POA: Diagnosis not present

## 2017-11-13 DIAGNOSIS — M62838 Other muscle spasm: Secondary | ICD-10-CM | POA: Diagnosis not present

## 2017-11-13 DIAGNOSIS — G43009 Migraine without aura, not intractable, without status migrainosus: Secondary | ICD-10-CM | POA: Diagnosis not present

## 2017-11-13 MED ORDER — ONDANSETRON HCL 8 MG PO TABS: 8.0000 mg | ORAL_TABLET | Freq: Three times a day (TID) | ORAL | 2 refills | Status: DC | PRN

## 2017-11-13 MED ORDER — NAPROXEN 500 MG PO TABS: 500.0000 mg | ORAL_TABLET | Freq: Two times a day (BID) | ORAL | 3 refills | Status: DC

## 2017-11-13 MED ORDER — RIZATRIPTAN BENZOATE 10 MG PO TBDP: 10.0000 mg | ORAL_TABLET | ORAL | 3 refills | Status: DC | PRN

## 2017-11-13 MED ORDER — ALPRAZOLAM 0.5 MG PO TABS: 0.5000 mg | ORAL_TABLET | Freq: Every day | ORAL | 0 refills | Status: DC | PRN

## 2017-11-13 MED ORDER — GALCANEZUMAB-GNLM 120 MG/ML ~~LOC~~ SOAJ: 120.0000 mg | SUBCUTANEOUS | 11 refills | Status: DC

## 2017-11-13 MED FILL — RIZATRIPTAN 10 MG ODT: 10 | 90 days supply | Qty: 27 | Fill #0

## 2017-11-13 MED FILL — ONDANSETRON HCL 8 MG TABLET: 8 | 13 days supply | Qty: 40 | Fill #0

## 2017-11-13 MED FILL — ALPRAZolam 0.5 MG TABS: 0.5 | 30 days supply | Qty: 30 | Fill #0

## 2017-11-13 NOTE — Progress Notes (Signed)
History:  Kimberly [name] is a 47 y.o. G1P1 who presents to clinic today for annual headache eval.  She has seen good results with Emgality except it does not seem to be lasting the full time.  It lasts about 2-2.5 weeks and then the headaches are horrible.  She has to leave work due to the pain and nausea.    Also she requests to change her imitrex to maxalt as the imitrex causes side effects.    Her life has been going well with her 49yo son back in school and she has recently become engaged.      HIT6:72 Number of days in the last 4 weeks with:  Severe headache: 6 Moderate headache: 4 Mild headache: 4  No headache: 14   Past Medical History:  Diagnosis Date  . Anorectal fistula   . Anxiety   . Arthritis    neck, hips  . Chronic headaches    migraines  . 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 Irritable bowel syndrome   . PONV (postoperative nausea and vomiting)    slow to wake up  . Wears glasses     Social History   Socioeconomic History  . Marital status: Married    Spouse name: Not on file  . Number of children: 1  . Years of education: Not on file  . Highest education level: Not on file  Occupational History  . Occupation: Psychologist, sport and exercise  Social Needs  . Financial resource strain: Not on file  . Food insecurity:    Worry: Not on file    Inability: Not on file  . Transportation needs:    Medical: Not on file    Non-medical: Not on file  Tobacco Use  . Smoking status: Never Smoker  . Smokeless tobacco: Never Used  Substance and Sexual Activity  . Alcohol use: Yes    Comment: occasional  . Drug use: No  . Sexual activity: Not Currently    Birth control/protection: None  Lifestyle  . Physical activity:    Days per week: Not on file    Minutes per session: Not on file  . Stress: Not on file  Relationships  . Social connections:    Talks on phone: Not on file    Gets together: Not on file    Attends  religious service: Not on file    Active member of club or organization: Not on file    Attends meetings of clubs or organizations: Not on file    Relationship status: Not on file  . Intimate partner violence:    Fear of current or ex partner: Not on file    Emotionally abused: Not on file    Physically abused: Not on file    Forced sexual activity: Not on file  Other Topics Concern  . Not on file  Social History Narrative  . Not on file    Family History  Problem Relation Age of Onset  . Colitis Father   . Crohn's disease Father   . Diabetes Father   . Diverticulitis Father   . Diabetes Mother   . Uterine cancer Mother 44  . Cancer Mother        endometrial  . Ovarian cancer Paternal Grandmother   . Cancer Paternal Grandmother 56       ovarian  . Diabetes Sister   . Uterine cancer Sister 22  . Cancer Sister  endometrial  . Irritable bowel syndrome Sister   . Irritable bowel syndrome Brother   . Colon cancer Paternal Uncle   . Cancer Maternal Uncle        lung    Allergies  Allergen Reactions  . Adhesive [Tape] Rash    Current Outpatient Medications on File Prior to Visit  Medication Sig Dispense Refill  . ALPRAZolam (XANAX) 0.5 MG tablet Take 1 tablet (0.5 mg total) by mouth daily as needed for anxiety. 30 tablet 0  . EMGALITY 120 MG/ML SOAJ INJECT 120 MG UNDER THE SKIN ONCE MONTHLY 1 pen 2  . flavoxATE (URISPAS) 100 MG tablet Take 1 tablet (100 mg total) by mouth 3 (three) times daily as needed for bladder spasms (bladder spasm). (Patient not taking: Reported on 11/03/2017) 10 tablet 1  . ibuprofen (ADVIL,MOTRIN) 200 MG tablet Take 800 mg by mouth daily as needed for headache or moderate pain.    . naproxen (NAPROSYN) 500 MG tablet Take 1 tablet (500 mg total) by mouth 2 (two) times daily with a meal. 90 tablet 3  . olmesartan-hydrochlorothiazide (BENICAR HCT) 20-12.5 MG tablet Take 1 tablet by mouth daily. 90 tablet 2  . ondansetron (ZOFRAN) 8 MG tablet  Take 1 tablet (8 mg total) by mouth every 8 (eight) hours as needed for nausea or vomiting. 40 tablet 2  . SUMAtriptan (IMITREX) 100 MG tablet Take 1 tablet (100 mg total) by mouth every 2 (two) hours as needed for migraine. As needed 30 tablet 3  . valACYclovir (VALTREX) 1000 MG tablet TAKE 1 TABLET BY MOUTH ONCE DAILY 90 tablet 0   No current facility-administered medications on file prior to visit.      Review of Systems:  All pertinent positive/negative included in HPI, all other review of systems are negative   Objective:  Physical Exam BP 119/81   Pulse 73   Wt 148 lb (67.1 kg)   LMP 01/09/2017 (Exact Date)   BMI 28.90 kg/m  CONSTITUTIONAL: Well-developed, well-nourished female in no acute distress.  EYES: EOM intact ENT: Normocephalic CARDIOVASCULAR: Regular rate RESPIRATORY: Normal rate. MUSCULOSKELETAL: Normal ROM SKIN: Warm, dry without erythema  NEUROLOGICAL: Alert, oriented, CN II-XII grossly intact, Appropriate balance PSYCH: Normal behavior, mood   Assessment & Plan:  Assessment: 1. Migraine without aura and without status migrainosus, not intractable   2. Muscle spasm   worsened   Plan: Sample emgality provided for pt to be able to take an extra dose of emgality to help it last longer.  Pt has tried multiple other preventives without adequate results: Ajovy, effexor, atenolol, topamax and others.  She is not interested in trying a daily medication at this time due to inefficacy of so many.   Maxalt in place of Imitrex.   Follow-up in 12 months or sooner PRN  [name], PA-C 11/13/2017 8:32 AM

## 2017-12-18 ENCOUNTER — Ambulatory Visit
Admission: RE | Admit: 2017-12-18 | Discharge: 2017-12-18 | Disposition: A | Discharge status: Home / Self Care | Payer: BLUE CROSS/BLUE SHIELD | Source: Ambulatory Visit | Attending: Family Medicine | Admitting: Family Medicine

## 2017-12-18 ENCOUNTER — Other Ambulatory Visit: Payer: Self-pay | Admitting: Family Medicine

## 2017-12-18 DIAGNOSIS — Z01419 Encounter for gynecological examination (general) (routine) without abnormal findings: Secondary | ICD-10-CM

## 2017-12-21 ENCOUNTER — Other Ambulatory Visit: Payer: Self-pay | Admitting: Family Medicine

## 2017-12-21 DIAGNOSIS — R928 Other abnormal and inconclusive findings on diagnostic imaging of breast: Secondary | ICD-10-CM

## 2017-12-22 ENCOUNTER — Ambulatory Visit
Admission: RE | Admit: 2017-12-22 | Discharge: 2017-12-22 | Disposition: A | Discharge status: Home / Self Care | Payer: BLUE CROSS/BLUE SHIELD | Source: Ambulatory Visit | Attending: Family Medicine | Admitting: Family Medicine

## 2017-12-22 ENCOUNTER — Ambulatory Visit: Payer: BLUE CROSS/BLUE SHIELD

## 2017-12-22 DIAGNOSIS — R928 Other abnormal and inconclusive findings on diagnostic imaging of breast: Secondary | ICD-10-CM

## 2018-01-26 ENCOUNTER — Other Ambulatory Visit: Payer: Self-pay | Admitting: *Deleted

## 2018-01-26 MED ORDER — PREDNISONE 20 MG PO TABS: 20.0000 mg | ORAL_TABLET | Freq: Every day | ORAL | 0 refills | Status: DC

## 2018-02-24 ENCOUNTER — Other Ambulatory Visit (HOSPITAL_COMMUNITY): Payer: Self-pay | Admitting: Family Medicine

## 2018-02-24 DIAGNOSIS — I10 Essential (primary) hypertension: Secondary | ICD-10-CM

## 2018-03-05 MED FILL — RIZATRIPTAN 10 MG ODT: 10 | 90 days supply | Qty: 27 | Fill #1

## 2018-03-17 ENCOUNTER — Encounter: Payer: Self-pay | Admitting: *Deleted

## 2018-04-06 ENCOUNTER — Telehealth: Payer: Self-pay

## 2018-04-06 ENCOUNTER — Other Ambulatory Visit: Payer: Self-pay

## 2018-04-06 NOTE — Telephone Encounter (Signed)
Patient requested Xanax 0.5mg  last refill 40 month ago. Per Karoline Caldwell ok for her have #20 filled at this time. Sent into [facility] in [place]

## 2018-04-08 ENCOUNTER — Telehealth: Payer: Self-pay | Admitting: Physician Assistant

## 2018-04-08 ENCOUNTER — Telehealth: Payer: Self-pay

## 2018-04-08 ENCOUNTER — Other Ambulatory Visit: Payer: Self-pay

## 2018-04-08 NOTE — Telephone Encounter (Signed)
Pt requesting Rx for Nurtec.  Will do.   Karena Addison will call in to patient's pharmacy.  Nurtec DT 75mg .  Use 1 prn migraine.  Disp #8.  RF x 11.

## 2018-04-08 NOTE — Telephone Encounter (Signed)
Patient is requesting a medication cause NURTEC ODT for migraines. Per Santiago Glad will called in 72 mg 1 po prn #8 with 11 refills.

## 2018-05-17 MED ORDER — PREDNISONE 20 MG PO TABS: 20.0000 mg | ORAL_TABLET | Freq: Every day | ORAL | 0 refills | Status: DC

## 2018-06-08 ENCOUNTER — Other Ambulatory Visit: Payer: Self-pay

## 2018-06-08 ENCOUNTER — Encounter (HOSPITAL_COMMUNITY): Payer: Self-pay

## 2018-06-08 ENCOUNTER — Ambulatory Visit (HOSPITAL_COMMUNITY)
Admission: EM | Admit: 2018-06-08 | Discharge: 2018-06-08 | Disposition: A | Discharge status: Home / Self Care | Payer: BLUE CROSS/BLUE SHIELD | Attending: Family Medicine | Admitting: Family Medicine

## 2018-06-08 ENCOUNTER — Ambulatory Visit (INDEPENDENT_AMBULATORY_CARE_PROVIDER_SITE_OTHER): Payer: BLUE CROSS/BLUE SHIELD

## 2018-06-08 DIAGNOSIS — W228XXA Striking against or struck by other objects, initial encounter: Secondary | ICD-10-CM | POA: Diagnosis not present

## 2018-06-08 DIAGNOSIS — S9031XA Contusion of right foot, initial encounter: Secondary | ICD-10-CM

## 2018-06-08 DIAGNOSIS — M79671 Pain in right foot: Secondary | ICD-10-CM

## 2018-06-08 NOTE — ED Triage Notes (Signed)
Patient presents to Urgent Care with complaints of right foot pain since las night. Patient reports she was painting an iron table and it fell when changing it's position and fell on the top of her foot, pt was wearing sandals. Bruising noted to top of foot near toes, pt is able to move extremities, denies tingling or numbness.

## 2018-06-08 NOTE — ED Provider Notes (Signed)
[facility]   [id number] 06/08/18 Arrival Time: 1211  ASSESSMENT & PLAN:  1. Contusion of right foot, initial encounter    I have personally viewed the imaging studies ordered this visit. No fractures appreciated.  Prefers OTC ibuprofen as needed. Elevate. Ice.  For comfort: Orders Placed This Encounter  Procedures   DG Foot Complete Right   WBAT  Follow-up Information    [facility].   Why:  If not improving over the next week. Contact information: [address] [place] [phone]         Reviewed expectations re: course of current medical issues. Questions answered. Outlined signs and symptoms indicating need for more acute intervention. Patient verbalized understanding. After Visit Summary given.  SUBJECTIVE: History from: patient. Kimberly [name] is a 48 y.o. female who reports fairly persistent moderate pain of her right dorsal distal foot; described as aching without radiation. Onset: abrupt, yesterday evening. Injury/trama: reports dropping edge of table onto foot; immediate discomfort. Symptoms have progressed to a point and plateaued since beginning. Aggravating factors: weight bearing. Alleviating factors: rest. Associated symptoms: none reported. Extremity sensation changes or weakness: none. Self treatment: tried OTCs with some relief of pain. History of similar: no.  Past Surgical History:  Procedure Laterality Date   BREAST REDUCTION SURGERY  1996   CESAREAN SECTION  2001   COLONOSCOPY  10-18-2010   w/ bx's   ESOPHAGOGASTRODUODENOSCOPY N/A 06/29/2012   Procedure: ESOPHAGOGASTRODUODENOSCOPY (EGD);  Surgeon: Madilyn Hook, DO;  Location: [facility];  Service: General;  Laterality: N/A;   EVALUATION UNDER ANESTHESIA WITH ANAL FISTULECTOMY N/A 04/14/2014   Procedure: ANAL EXAM UNDER ANESTHESIA WITH  FISTULOTOMY;  Surgeon: [name], MD;  Location: [facility];   Service: General;  Laterality: N/A;   LAPAROSCOPIC BILATERAL SALPINGECTOMY N/A 06/29/2012   Procedure: LAPAROSCOPIC BILATERAL SALPINGECTOMY;  Surgeon: Donnamae Jude, MD;  Location: [facility];  Service: Gynecology;  Laterality: N/A;   LAPAROSCOPIC GASTRIC SLEEVE RESECTION N/A 06/29/2012   Procedure: LAPAROSCOPIC GASTRIC SLEEVE RESECTION;  Surgeon: Madilyn Hook, DO;  Location: [facility];  Service: General;  Laterality: N/A;  Laparoscopic Sleeve Gastrectomy with EGD   REDUCTION MAMMAPLASTY Bilateral    VAGINAL HYSTERECTOMY N/A 01/13/2017   Procedure: HYSTERECTOMY VAGINAL;  Surgeon: Donnamae Jude, MD;  Location: [facility];  Service: Gynecology;  Laterality: N/A;    ROS: As per HPI. All other systems negative.  OBJECTIVE:  Vitals:   06/08/18 1226  BP: (!) 129/91  Pulse: 75  Resp: 18  Temp: 98.5 F (36.9 C)  TempSrc: Oral  SpO2: 100%    General appearance: alert; no distress HEENT: [place]; AT Neck: supple with FROM Resp: unlabored respirations Extremities:  RLE: warm and well perfused; fairly well localized moderate tenderness over right distal dorsal foot; without gross deformities; with mild swelling; with no bruising; ROM: normal with reported discomfort CV: brisk extremity capillary refill of RLE; 2+ PT pulse of RLE. Skin: warm and dry; no visible rashes Neurologic: gait normal but favors RLE; normal reflexes of RLE and LLE; normal sensation of RLE and LLE; normal strength of RLE and LLE Psychological: alert and cooperative; normal mood and affect  Imaging: Dg Foot Complete Right  Result Date: 06/08/2018 CLINICAL DATA:  Pain and bruising involving the dorsum of the foot after dropping a table on foot yesterday. EXAM: RIGHT FOOT COMPLETE - 3+ VIEW COMPARISON:  None. FINDINGS: The mineralization and alignment are normal. There is no evidence  of acute fracture or dislocation. The joint spaces are preserved. There is a mild hallux valgus deformity. No focal soft tissue swelling or foreign body  identified. Small accessory navicular noted. IMPRESSION: No evidence of acute fracture or dislocation. Electronically Signed   By: Richardean Sale M.D.   On: 06/08/2018 12:59   Allergies  Allergen Reactions   Adhesive [Tape] Rash    Past Medical History:  Diagnosis Date   Anorectal fistula    Anxiety    Arthritis    neck, hips   Chronic headaches    migraines   History of hypertension    since wt loss after gastric sleeve 2014--  no longer issue   Irritable bowel syndrome    PONV (postoperative nausea and vomiting)    slow to wake up   Wears glasses    Social History   Socioeconomic History   Marital status: Married    Spouse name: Not on file   Number of children: 1   Years of education: Not on file   Highest education level: Not on file  Occupational History   Occupation: Psychologist, sport and exercise  Social Needs   Financial resource strain: Not on file   Food insecurity:    Worry: Not on file    Inability: Not on file   Transportation needs:    Medical: Not on file    Non-medical: Not on file  Tobacco Use   Smoking status: Never Smoker   Smokeless tobacco: Never Used  Substance and Sexual Activity   Alcohol use: Yes    Comment: occasional   Drug use: No   Sexual activity: Not Currently    Birth control/protection: None  Lifestyle   Physical activity:    Days per week: Not on file    Minutes per session: Not on file   Stress: Not on file  Relationships   Social connections:    Talks on phone: Not on file    Gets together: Not on file    Attends religious service: Not on file    Active member of club or organization: Not on file    Attends meetings of clubs or organizations: Not on file    Relationship status: Not on file  Other Topics Concern   Not on file  Social History Narrative   Not on file   Family History  Problem Relation Age of Onset   Colitis Father    Crohn's disease Father    Diabetes Father    Diverticulitis  Father    Diabetes Mother    Uterine cancer Mother 44   Cancer Mother        endometrial   Ovarian cancer Paternal Grandmother    Cancer Paternal Grandmother 30       ovarian   Diabetes Sister    Uterine cancer Sister 39   Cancer Sister        endometrial   Irritable bowel syndrome Sister    Irritable bowel syndrome Brother    Colon cancer Paternal Uncle    Cancer Maternal Uncle        lung   Past Surgical History:  Procedure Laterality Date   BREAST REDUCTION SURGERY  1996   CESAREAN SECTION  2001   COLONOSCOPY  10-18-2010   w/ bx's   ESOPHAGOGASTRODUODENOSCOPY N/A 06/29/2012   Procedure: ESOPHAGOGASTRODUODENOSCOPY (EGD);  Surgeon: Madilyn Hook, DO;  Location: [facility];  Service: General;  Laterality: N/A;   EVALUATION UNDER ANESTHESIA WITH ANAL FISTULECTOMY N/A 04/14/2014   Procedure:  ANAL EXAM UNDER ANESTHESIA WITH  FISTULOTOMY;  Surgeon: [name], MD;  Location: [facility];  Service: General;  Laterality: N/A;   LAPAROSCOPIC BILATERAL SALPINGECTOMY N/A 06/29/2012   Procedure: LAPAROSCOPIC BILATERAL SALPINGECTOMY;  Surgeon: Donnamae Jude, MD;  Location: [facility];  Service: Gynecology;  Laterality: N/A;   LAPAROSCOPIC GASTRIC SLEEVE RESECTION N/A 06/29/2012   Procedure: LAPAROSCOPIC GASTRIC SLEEVE RESECTION;  Surgeon: Madilyn Hook, DO;  Location: [facility];  Service: General;  Laterality: N/A;  Laparoscopic Sleeve Gastrectomy with EGD   REDUCTION MAMMAPLASTY Bilateral    VAGINAL HYSTERECTOMY N/A 01/13/2017   Procedure: HYSTERECTOMY VAGINAL;  Surgeon: Donnamae Jude, MD;  Location: [facility];  Service: Gynecology;  Laterality: N/AVanessa Kick, MD 06/08/18 1324

## 2018-06-23 ENCOUNTER — Encounter: Payer: Self-pay | Admitting: Physician Assistant

## 2018-06-23 ENCOUNTER — Other Ambulatory Visit: Payer: Self-pay

## 2018-06-23 ENCOUNTER — Ambulatory Visit: Payer: Self-pay

## 2018-06-23 ENCOUNTER — Ambulatory Visit (INDEPENDENT_AMBULATORY_CARE_PROVIDER_SITE_OTHER): Payer: BLUE CROSS/BLUE SHIELD | Admitting: Physician Assistant

## 2018-06-23 DIAGNOSIS — M79671 Pain in right foot: Secondary | ICD-10-CM | POA: Diagnosis not present

## 2018-06-23 NOTE — Progress Notes (Signed)
Office Visit Note   Patient: Kimberly [name]           Date of Birth: 1970/02/22           MRN: [id number] Visit Date: 06/23/2018              Requested by: Donnamae Jude, MD [place],  [place] [address] PCP: Donnamae Jude, MD   Assessment & Plan: Visit Diagnoses:  1. Pain in right foot     Plan: She will continue to work on elevation.  She will slowly transition from Cam walker boot to a postop shoe which was given today.  No high impact activities until her pain dissipates.  She can resume activities as pain permits but I did talk to her and cautioned her to not work through the pain.  She can continue to use Voltaren gel gel and ice to the foot.  Should follow-up if pain persist or becomes worse.  Told her she could likely expect pain up to 3 months with a bony contusion.  Follow-Up Instructions: Return if symptoms worsen or fail to improve.   Orders:  Orders Placed This Encounter  Procedures  . XR Foot Complete Right   No orders of the defined types were placed in this encounter.     Procedures: No procedures performed   Clinical Data: No additional findings.   Subjective: Chief Complaint  Patient presents with  . Right Foot - Pain    HPI [name] is a 48 year old female comes in today with right foot pain.  She reports that she dropped an iron table on her foot striking over her metatarsal head region.  This occurred on 06/07/2018.  She went to urgent care and radiographs were obtained.  I reviewed these he shows no acute fracture.  She is placed in a cam walker boot.  She states she still having pain that is different than she had initially gone from a sharp pain to a achy pain.  She is been using Voltaren gel on dorsal aspect of the foot.   Review of Systems No fevers or chills.  Objective: Vital Signs: LMP 01/09/2017 (Exact Date)   Physical Exam Constitutional:      Appearance: She is not ill-appearing or diaphoretic.  Pulmonary:   Effort: Pulmonary effort is normal.  Neurological:     Mental Status: She is alert and oriented to person, place, and time.     Ortho Exam Right foot no significant ecchymosis erythema or edema.  No rashes skin lesions ulcerations or impending ulcers.  Tenderness over the right second third metatarsal head area.  Medial foot nontender.  She has good range of motion the ankle without pain.  Sensation intact throughout the foot.  Dorsal pedal pulses 2+.  Right calf supple nontender. Specialty Comments:  No specialty comments available.  Imaging: Xr Foot Complete Right  Result Date: 06/23/2018 Right foot 3 views: No acute fracture.  No periosteal reaction.  Lisfranc joints are well-maintained.  Small accessory navicular bone seen.  Hallux valgus deformity mild.    PMFS History: Patient Active Problem List   Diagnosis Date Noted  . Muscle spasm 11/13/2017  . Pap smear of vagina with LGSIL 06/05/2015  . 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 Migraine without aura 09/13/2010  . ANA positive 09/13/2010  . Polyarthralgia 09/13/2010  . Essential hypertension 01/11/2009  . 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   Past  Medical History:  Diagnosis Date  . Anorectal fistula   . Anxiety   . Arthritis    neck, hips  . Chronic headaches    migraines  . 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 Irritable bowel syndrome   . PONV (postoperative nausea and vomiting)    slow to wake up  . Wears glasses     Family History  Problem Relation Age of Onset  . Colitis Father   . Crohn's disease Father   . Diabetes Father   . Diverticulitis Father   . Diabetes Mother   . Uterine cancer Mother 73  . Cancer Mother        endometrial  . Ovarian cancer Paternal Grandmother   . Cancer Paternal Grandmother 68       ovarian  . Diabetes Sister   . Uterine cancer  Sister 36  . Cancer Sister        endometrial  . Irritable bowel syndrome Sister   . Irritable bowel syndrome Brother   . Colon cancer Paternal Uncle   . Cancer Maternal Uncle        lung    Past Surgical History:  Procedure Laterality Date  . BREAST REDUCTION SURGERY  1996  . CESAREAN SECTION  2001  . COLONOSCOPY  10-18-2010   w/ bx's  . ESOPHAGOGASTRODUODENOSCOPY N/A 06/29/2012   Procedure: ESOPHAGOGASTRODUODENOSCOPY (EGD);  Surgeon: Madilyn Hook, DO;  Location: [facility];  Service: General;  Laterality: N/A;  . EVALUATION UNDER ANESTHESIA WITH ANAL FISTULECTOMY N/A 04/14/2014   Procedure: ANAL EXAM UNDER ANESTHESIA WITH  FISTULOTOMY;  Surgeon: [name], MD;  Location: [facility];  Service: General;  Laterality: N/A;  . LAPAROSCOPIC BILATERAL SALPINGECTOMY N/A 06/29/2012   Procedure: LAPAROSCOPIC BILATERAL SALPINGECTOMY;  Surgeon: Donnamae Jude, MD;  Location: [facility];  Service: Gynecology;  Laterality: N/A;  . LAPAROSCOPIC GASTRIC SLEEVE RESECTION N/A 06/29/2012   Procedure: LAPAROSCOPIC GASTRIC SLEEVE RESECTION;  Surgeon: Madilyn Hook, DO;  Location: [facility];  Service: General;  Laterality: N/A;  Laparoscopic Sleeve Gastrectomy with EGD  . REDUCTION MAMMAPLASTY Bilateral   . VAGINAL HYSTERECTOMY N/A 01/13/2017   Procedure: HYSTERECTOMY VAGINAL;  Surgeon: Donnamae Jude, MD;  Location: [facility];  Service: Gynecology;  Laterality: N/A;   Social History   Occupational History  . Occupation: Psychologist, sport and exercise  Tobacco Use  . Smoking status: Never Smoker  . Smokeless tobacco: Never Used  Substance and Sexual Activity  . Alcohol use: Yes    Comment: occasional  . Drug use: No  . Sexual activity: Not Currently    Birth control/protection: None

## 2018-06-28 ENCOUNTER — Encounter: Payer: Self-pay | Admitting: *Deleted

## 2018-07-07 ENCOUNTER — Telehealth: Payer: Self-pay | Admitting: *Deleted

## 2018-07-07 NOTE — Telephone Encounter (Signed)
Faxed in refill for prednisone taper. Okay per Dr Kennon Rounds.

## 2018-09-08 ENCOUNTER — Telehealth: Payer: Self-pay | Admitting: Physician Assistant

## 2018-09-08 MED ORDER — TIZANIDINE HCL 4 MG PO TABS: 4.0000 mg | ORAL_TABLET | Freq: Four times a day (QID) | ORAL | 2 refills | Status: AC | PRN

## 2018-09-08 MED ORDER — AJOVY 225 MG/1.5ML ~~LOC~~ SOAJ: 675.0000 mg | SUBCUTANEOUS | 3 refills | Status: DC

## 2018-09-08 NOTE — Telephone Encounter (Signed)
Pt called to request refill of zanaflex.  She has not gotten this medication in the last couple of years but needs it now.   Rx sent to [facility] as requested.   Pt reminded she is due to be seen in November 2020.

## 2018-09-08 NOTE — Telephone Encounter (Signed)
Pt called back and states her Emgality is wearing off after only 3 weeks, requesting if we can prescribe it to be used more often.  After some discussion, we will have her try Ajovy instead.  Pt may use when due for next Emgality.  SHe may do 3 injections all at once, every 3 months OR she may do one injection every 30 days.   Pt may print the savings card online or come to the office for sample/savings card.

## 2018-09-29 ENCOUNTER — Telehealth: Payer: Self-pay | Admitting: Physician Assistant

## 2018-09-29 MED ORDER — BUSPIRONE HCL 5 MG PO TABS: 5.0000 mg | ORAL_TABLET | Freq: Two times a day (BID) | ORAL | 3 refills | Status: DC

## 2018-09-29 NOTE — Telephone Encounter (Signed)
Pt requesting new rx for Buspar.  She states she used it previously with good efficacy but has been off of it for some time.  The occasional xanax is not sufficient these days to control her anxiety and she believes she needs something daily.   Will prescribe buspar 5mg  bid.  Pt to begin one daily initially and increase to bid as tolerated.

## 2018-11-18 ENCOUNTER — Other Ambulatory Visit: Payer: Self-pay

## 2018-11-18 NOTE — Telephone Encounter (Signed)
Refill on Tizanidine 4 mg

## 2018-11-26 ENCOUNTER — Other Ambulatory Visit: Payer: Self-pay

## 2018-11-26 ENCOUNTER — Encounter: Payer: Self-pay | Admitting: Physician Assistant

## 2018-11-26 ENCOUNTER — Ambulatory Visit: Payer: BLUE CROSS/BLUE SHIELD | Admitting: Physician Assistant

## 2018-11-26 VITALS — BP 137/85 | HR 75 | Wt 155.0 lb

## 2018-11-26 DIAGNOSIS — M62838 Other muscle spasm: Secondary | ICD-10-CM | POA: Diagnosis not present

## 2018-11-26 DIAGNOSIS — G43009 Migraine without aura, not intractable, without status migrainosus: Secondary | ICD-10-CM | POA: Diagnosis not present

## 2018-11-26 MED ORDER — CYCLOBENZAPRINE HCL 10 MG PO TABS: 10.0000 mg | ORAL_TABLET | Freq: Three times a day (TID) | ORAL | 6 refills | Status: AC | PRN

## 2018-11-26 MED ORDER — REYVOW 100 MG PO TABS: 100.0000 mg | ORAL_TABLET | ORAL | 3 refills | Status: DC | PRN

## 2018-11-26 MED ORDER — KETOROLAC TROMETHAMINE 60 MG/2ML IM SOLN: 60.0000 mg | Freq: Once | INTRAMUSCULAR | 0 refills | Status: AC

## 2018-11-26 MED ORDER — NAPROXEN 500 MG PO TABS: 500.0000 mg | ORAL_TABLET | Freq: Two times a day (BID) | ORAL | 3 refills | Status: DC

## 2018-11-26 MED ORDER — ONDANSETRON HCL 8 MG PO TABS: 8.0000 mg | ORAL_TABLET | Freq: Three times a day (TID) | ORAL | 2 refills | Status: AC | PRN

## 2018-11-26 MED ORDER — ALPRAZOLAM 0.5 MG PO TABS: 0.5000 mg | ORAL_TABLET | Freq: Every day | ORAL | 0 refills | Status: DC | PRN

## 2018-11-26 MED ORDER — RIZATRIPTAN BENZOATE 10 MG PO TBDP: 10.0000 mg | ORAL_TABLET | ORAL | 3 refills | Status: AC | PRN

## 2018-11-26 MED ORDER — BUSPIRONE HCL 10 MG PO TABS: 10.0000 mg | ORAL_TABLET | Freq: Three times a day (TID) | ORAL | 3 refills | Status: DC

## 2018-11-26 NOTE — Progress Notes (Signed)
History:  Kimberly [name] is a 48 y.o. G1P1 who presents to clinic today for yearly headache visit.  Her headaches have been much worse over the last few months.  She used 3 ajovy all at once in early October.  It seemed to work well initially but wore off after 6 weeks.  She believes the buspar helped some but she thinks she needs to go up on the dose.  She questions if prednisone as needed would be helpful. She sees massage therapist but very infrequently.  She has severe neck pain that is not easily resolved.  She requests daytime muscle relaxant.    Nurtec worked some of the time but not consistently.  If the headache was severe, then it was less likely to help.    HIT6:70 Number of days in the last 4 weeks with:  Severe headache: 6 Moderate headache: 6 Mild headache: 2  No headache: 14   Past Medical History:  Diagnosis Date  . Anorectal fistula   . Anxiety   . Arthritis    neck, hips  . Chronic headaches    migraines  . 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 Irritable bowel syndrome   . PONV (postoperative nausea and vomiting)    slow to wake up  . Wears glasses     Social History   Socioeconomic History  . Marital status: Divorced    Spouse name: Not on file  . Number of children: 1  . Years of education: Not on file  . Highest education level: Not on file  Occupational History  . Occupation: Psychologist, sport and exercise  Social Needs  . Financial resource strain: Not on file  . Food insecurity    Worry: Not on file    Inability: Not on file  . Transportation needs    Medical: Not on file    Non-medical: Not on file  Tobacco Use  . Smoking status: Never Smoker  . Smokeless tobacco: Never Used  Substance and Sexual Activity  . Alcohol use: Yes    Comment: occasional  . Drug use: No  . Sexual activity: Not Currently    Birth control/protection: None  Lifestyle  . Physical activity    Days per week: Not on file     Minutes per session: Not on file  . Stress: Not on file  Relationships  . Social Herbalist on phone: Not on file    Gets together: Not on file    Attends religious service: Not on file    Active member of club or organization: Not on file    Attends meetings of clubs or organizations: Not on file    Relationship status: Not on file  . Intimate partner violence    Fear of current or ex partner: Not on file    Emotionally abused: Not on file    Physically abused: Not on file    Forced sexual activity: Not on file  Other Topics Concern  . Not on file  Social History Narrative  . Not on file    Family History  Problem Relation Age of Onset  . Colitis Father   . Crohn's disease Father   . Diabetes Father   . Diverticulitis Father   . Diabetes Mother   . Uterine cancer Mother 29  . Cancer Mother        endometrial  . Ovarian cancer Paternal Grandmother   .  Cancer Paternal Grandmother 69       ovarian  . Diabetes Sister   . Uterine cancer Sister 51  . Cancer Sister        endometrial  . Irritable bowel syndrome Sister   . Irritable bowel syndrome Brother   . Colon cancer Paternal Uncle   . Cancer Maternal Uncle        lung    Allergies  Allergen Reactions  . Adhesive [Tape] Rash    Current Outpatient Medications on File Prior to Visit  Medication Sig Dispense Refill  . ALPRAZolam (XANAX) 0.5 MG tablet Take 1 tablet (0.5 mg total) by mouth daily as needed for anxiety. 30 tablet 0  . busPIRone (BUSPAR) 5 MG tablet Take 1 tablet (5 mg total) by mouth 2 (two) times daily. 60 tablet 3  . Fremanezumab-vfrm (AJOVY) 225 MG/1.5ML SOAJ Inject 675 mg into the skin every 3 (three) months. 3 pen 3  . hydrochlorothiazide (HYDRODIURIL) 25 MG tablet TAKE 1 TABLET(25 MG) BY MOUTH DAILY 90 tablet 3  . ibuprofen (ADVIL,MOTRIN) 200 MG tablet Take 800 mg by mouth daily as needed for headache or moderate pain.    . naproxen (NAPROSYN) 500 MG tablet Take 1 tablet (500 mg total)  by mouth 2 (two) times daily with a meal. 90 tablet 3  . olmesartan-hydrochlorothiazide (BENICAR HCT) 20-12.5 MG tablet Take 1 tablet by mouth daily. 90 tablet 2  . ondansetron (ZOFRAN) 8 MG tablet Take 1 tablet (8 mg total) by mouth every 8 (eight) hours as needed for nausea or vomiting. 40 tablet 2  . rizatriptan (MAXALT-MLT) 10 MG disintegrating tablet Take 1 tablet (10 mg total) by mouth as needed for migraine. May repeat in 2 hours if needed 30 tablet 3  . tiZANidine (ZANAFLEX) 4 MG tablet     . valACYclovir (VALTREX) 1000 MG tablet TAKE 1 TABLET BY MOUTH ONCE DAILY 90 tablet 0  . flavoxATE (URISPAS) 100 MG tablet Take 1 tablet (100 mg total) by mouth 3 (three) times daily as needed for bladder spasms (bladder spasm). (Patient not taking: Reported on 11/03/2017) 10 tablet 1  . Galcanezumab-gnlm (EMGALITY) 120 MG/ML SOAJ Inject 120 mg into the skin every 30 (thirty) days. 1 pen 11  . predniSONE (DELTASONE) 20 MG tablet Take 1 tablet (20 mg total) by mouth daily with breakfast. Pt to take 80 mg first day, then 60 mg on second day, 40 mg on the third day and then 20 mg on the fourth day. 10 tablet 0   No current facility-administered medications on file prior to visit.      Review of Systems:  All pertinent positive/negative included in HPI, all other review of systems are negative   Objective:  Physical Exam BP 137/85   Pulse 75   Wt 155 lb (70.3 kg)   LMP 01/09/2017 (Exact Date)   BMI 30.27 kg/m  CONSTITUTIONAL: Well-developed, well-nourished female in no acute distress.  EYES: EOM intact ENT: Normocephalic CARDIOVASCULAR: Regular rate RESPIRATORY: Normal rate. .  MUSCULOSKELETAL: Normal ROM,  muscle spasm noted SKIN: Warm, dry without erythema  NEUROLOGICAL: Alert, oriented, CN II-XII grossly intact, Appropriate balance PSYCH: Normal behavior, mood   Assessment & Plan:  Assessment: 1. Migraine without aura and without status migrainosus, not intractable   2. Muscle spasm     Worsening of both migraine and muscle spasm  Plan: Stay with ajovy for now.  4th month can be tipping point for good efficacy. Will trial Reyvow for rescue. Pt advised  and agreeable not to drive for 8 hours following usage.  Will trial Ubrelvy for acute migraine Will trial flexeril for muscle tension - sedation precautions discussed I do not think prn prednisone is safe.  I have given rx for toradol that pt can have injected when needed. Follow-up in 12 months or sooner PRN  [name], PA-C 11/26/2018 9:48 AM

## 2018-12-13 ENCOUNTER — Encounter: Payer: Self-pay | Admitting: *Deleted

## 2018-12-13 ENCOUNTER — Telehealth: Payer: Self-pay | Admitting: *Deleted

## 2018-12-13 NOTE — Telephone Encounter (Signed)
[employer] called to let us know that pts Ajovy was denied. Pt must fail Emgality and Aimovig in order to approve Ajovy. Will inform provider and pt.

## 2019-02-04 ENCOUNTER — Other Ambulatory Visit: Payer: Self-pay | Admitting: Physician Assistant

## 2019-02-04 ENCOUNTER — Other Ambulatory Visit: Payer: Self-pay

## 2019-02-04 MED ORDER — UBRELVY 100 MG PO TABS: 100.0000 mg | ORAL_TABLET | ORAL | 11 refills | Status: DC | PRN

## 2019-02-04 MED ORDER — ALPRAZOLAM 0.5 MG PO TABS: 0.5000 mg | ORAL_TABLET | Freq: Every day | ORAL | 0 refills | Status: DC | PRN

## 2019-02-07 ENCOUNTER — Encounter: Payer: Self-pay | Admitting: *Deleted

## 2019-02-20 ENCOUNTER — Other Ambulatory Visit: Payer: Self-pay | Admitting: Physician Assistant

## 2019-02-21 ENCOUNTER — Encounter: Payer: Self-pay | Admitting: *Deleted

## 2019-03-29 ENCOUNTER — Other Ambulatory Visit: Payer: Self-pay | Admitting: Physician Assistant

## 2019-04-15 ENCOUNTER — Other Ambulatory Visit: Payer: Self-pay | Admitting: Physician Assistant

## 2019-05-11 ENCOUNTER — Other Ambulatory Visit: Payer: Self-pay | Admitting: Primary Care

## 2019-05-18 ENCOUNTER — Encounter: Payer: Self-pay | Admitting: *Deleted

## 2019-05-23 ENCOUNTER — Ambulatory Visit (INDEPENDENT_AMBULATORY_CARE_PROVIDER_SITE_OTHER): Payer: BLUE CROSS/BLUE SHIELD | Admitting: Family Medicine

## 2019-05-23 ENCOUNTER — Encounter: Payer: Self-pay | Admitting: Family Medicine

## 2019-05-23 ENCOUNTER — Other Ambulatory Visit: Payer: Self-pay

## 2019-05-23 VITALS — BP 149/90 | HR 74 | Wt 163.9 lb

## 2019-05-23 DIAGNOSIS — I10 Essential (primary) hypertension: Secondary | ICD-10-CM | POA: Diagnosis not present

## 2019-05-23 DIAGNOSIS — Z01419 Encounter for gynecological examination (general) (routine) without abnormal findings: Secondary | ICD-10-CM

## 2019-05-23 DIAGNOSIS — G43009 Migraine without aura, not intractable, without status migrainosus: Secondary | ICD-10-CM

## 2019-05-23 LAB — CBC
Hematocrit: 46.9 % — ABNORMAL HIGH (ref 34.0–46.6)
Hemoglobin: 15.4 g/dL (ref 11.1–15.9)
MCH: 33 pg (ref 26.6–33.0)
MCHC: 32.8 g/dL (ref 31.5–35.7)
MCV: 100 fL — ABNORMAL HIGH (ref 79–97)
Platelets: 285 10*3/uL (ref 150–450)
RBC: 4.67 x10E6/uL (ref 3.77–5.28)
RDW: 11.9 % (ref 11.7–15.4)
WBC: 6.6 10*3/uL (ref 3.4–10.8)

## 2019-05-23 NOTE — Progress Notes (Signed)
Last 09/02/2016-normal  No sti testing

## 2019-05-23 NOTE — Patient Instructions (Signed)

## 2019-05-23 NOTE — Progress Notes (Signed)
  Subjective:     Kimberly [name] is a 49 y.o. female and is here for a comprehensive physical exam. The patient reports problems - inability to lose weight. Thinks it might be related to Ajovy. Joints and everything feels better when weight is less--continues to go to the gym 6 days/week and watch diet. Reports occasional left sided ankle swelling.  The following portions of the patient's history were reviewed and updated as appropriate: allergies, current medications, past family history, past medical history, past social history, past surgical history and problem list.  Review of Systems Pertinent items noted in HPI and remainder of comprehensive ROS otherwise negative.   Objective:    BP (!) 149/90   Pulse 74   Wt 163 lb 14.4 oz (74.3 kg)   LMP 01/09/2017 (Exact Date)   BMI 32.01 kg/m  General appearance: alert, cooperative and appears stated age Head: Normocephalic, without obvious abnormality, atraumatic Neck: no adenopathy, supple, symmetrical, trachea midline and thyroid not enlarged, symmetric, no tenderness/mass/nodules Lungs: clear to auscultation bilaterally Breasts: normal appearance, no masses or tenderness Heart: regular rate and rhythm, S1, S2 normal, no murmur, click, rub or gallop Abdomen: soft, non-tender; bowel sounds normal; no masses,  no organomegaly Pelvic: external genitalia normal, no adnexal masses or tenderness, uterus surgically absent and vagina normal without discharge Extremities: extremities normal, atraumatic, no cyanosis or edema Pulses: 2+ and symmetric Skin: Skin color, texture, turgor normal. No rashes or lesions Lymph nodes: Cervical, supraclavicular, and axillary nodes normal. Neurologic: Grossly normal    Assessment:    Healthy female exam.      Plan:   Problem List Items Addressed This Visit      Unprioritized   Migraine without aura (Chronic)   Essential hypertension    Taking HCTZ--keep track of BP--if remains elevated consider  further meds. Diet and exercise.       Other Visit Diagnoses    Encounter for gynecological examination without abnormal finding    -  Primary   Relevant Orders   MM 3D SCREEN BREAST BILATERAL   CBC (Completed)   TSH (Completed)   Comprehensive metabolic panel (Completed)   Lipid panel (Completed)   VITAMIN D 25 Hydroxy (Vit-D Deficiency, Fractures) (Completed)     Return in 1 year (on 05/22/2020).    See After Visit Summary for Counseling Recommendations

## 2019-05-24 LAB — COMPREHENSIVE METABOLIC PANEL
ALT: 16 IU/L (ref 0–32)
AST: 25 IU/L (ref 0–40)
Albumin/Globulin Ratio: 1.3 (ref 1.2–2.2)
Albumin: 4.3 g/dL (ref 3.8–4.8)
Alkaline Phosphatase: 89 IU/L (ref 48–121)
BUN/Creatinine Ratio: 19 (ref 9–23)
BUN: 18 mg/dL (ref 6–24)
Bilirubin Total: 0.2 mg/dL (ref 0.0–1.2)
CO2: 24 mmol/L (ref 20–29)
Calcium: 9.6 mg/dL (ref 8.7–10.2)
Chloride: 100 mmol/L (ref 96–106)
Creatinine, Ser: 0.94 mg/dL (ref 0.57–1.00)
GFR calc Af Amer: 84 mL/min/{1.73_m2} (ref >59)
GFR calc non Af Amer: 72 mL/min/{1.73_m2} (ref >59)
Globulin, Total: 3.4 g/dL (ref 1.5–4.5)
Glucose: 88 mg/dL (ref 65–99)
Potassium: 3.9 mmol/L (ref 3.5–5.2)
Sodium: 140 mmol/L (ref 134–144)
Total Protein: 7.7 g/dL (ref 6.0–8.5)

## 2019-05-24 LAB — LIPID PANEL
Chol/HDL Ratio: 3 ratio (ref 0.0–4.4)
Cholesterol, Total: 193 mg/dL (ref 100–199)
HDL: 64 mg/dL (ref >39)
LDL Chol Calc (NIH): 111 mg/dL — ABNORMAL HIGH (ref 0–99)
Triglycerides: 98 mg/dL (ref 0–149)
VLDL Cholesterol Cal: 18 mg/dL (ref 5–40)

## 2019-05-24 LAB — VITAMIN D 25 HYDROXY (VIT D DEFICIENCY, FRACTURES): Vit D, 25-Hydroxy: 60.8 ng/mL (ref 30.0–100.0)

## 2019-05-24 LAB — TSH: TSH: 0.923 u[IU]/mL (ref 0.450–4.500)

## 2019-05-24 NOTE — Assessment & Plan Note (Signed)
Taking HCTZ--keep track of BP--if remains elevated consider further meds. Diet and exercise.

## 2019-06-11 ENCOUNTER — Other Ambulatory Visit: Payer: Self-pay | Admitting: Physician Assistant

## 2019-06-16 ENCOUNTER — Other Ambulatory Visit: Payer: Self-pay | Admitting: *Deleted

## 2019-06-16 DIAGNOSIS — I10 Essential (primary) hypertension: Secondary | ICD-10-CM

## 2019-06-16 MED ORDER — OLMESARTAN MEDOXOMIL-HCTZ 20-12.5 MG PO TABS: 1.0000 | ORAL_TABLET | Freq: Every day | ORAL | 2 refills | Status: DC

## 2019-08-23 IMAGING — MG DIGITAL DIAGNOSTIC UNILATERAL LEFT MAMMOGRAM WITH TOMO AND CAD
4 series · 4 of 12 positions shown · non-contrast
Comparison: Previous exam(s).

CLINICAL DATA: Patient returns today to evaluate a possible LEFT
breast asymmetry and possible LEFT breast mass questioned on recent
screening mammogram.

EXAM:
DIGITAL DIAGNOSTIC UNILATERAL LEFT MAMMOGRAM WITH CAD AND TOMO

[L MLO synth-2D]
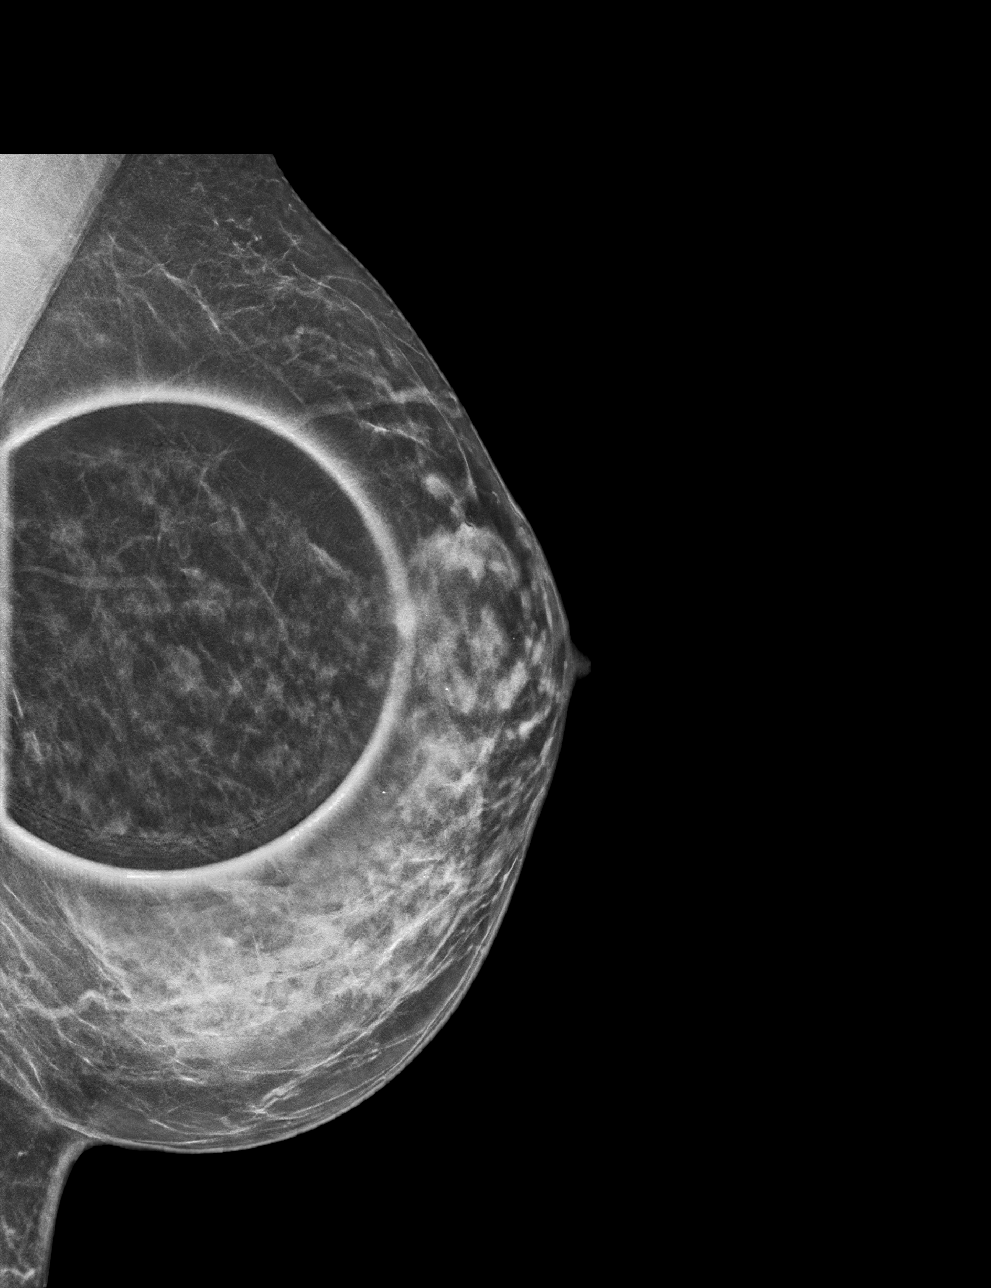

[L CC synth-2D]
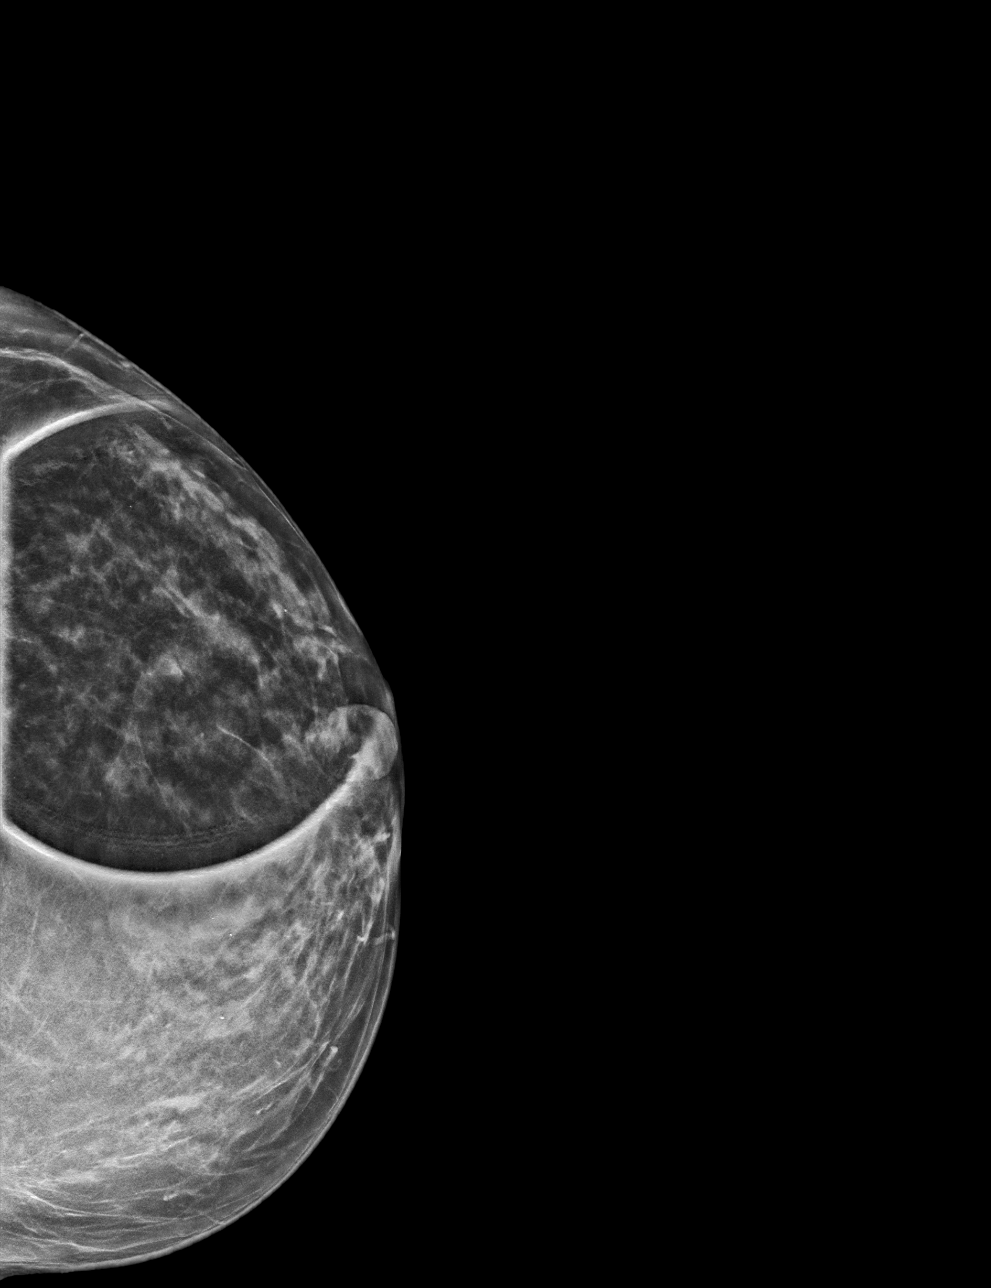

[L CC tomo · tomo slice 23/44.0]
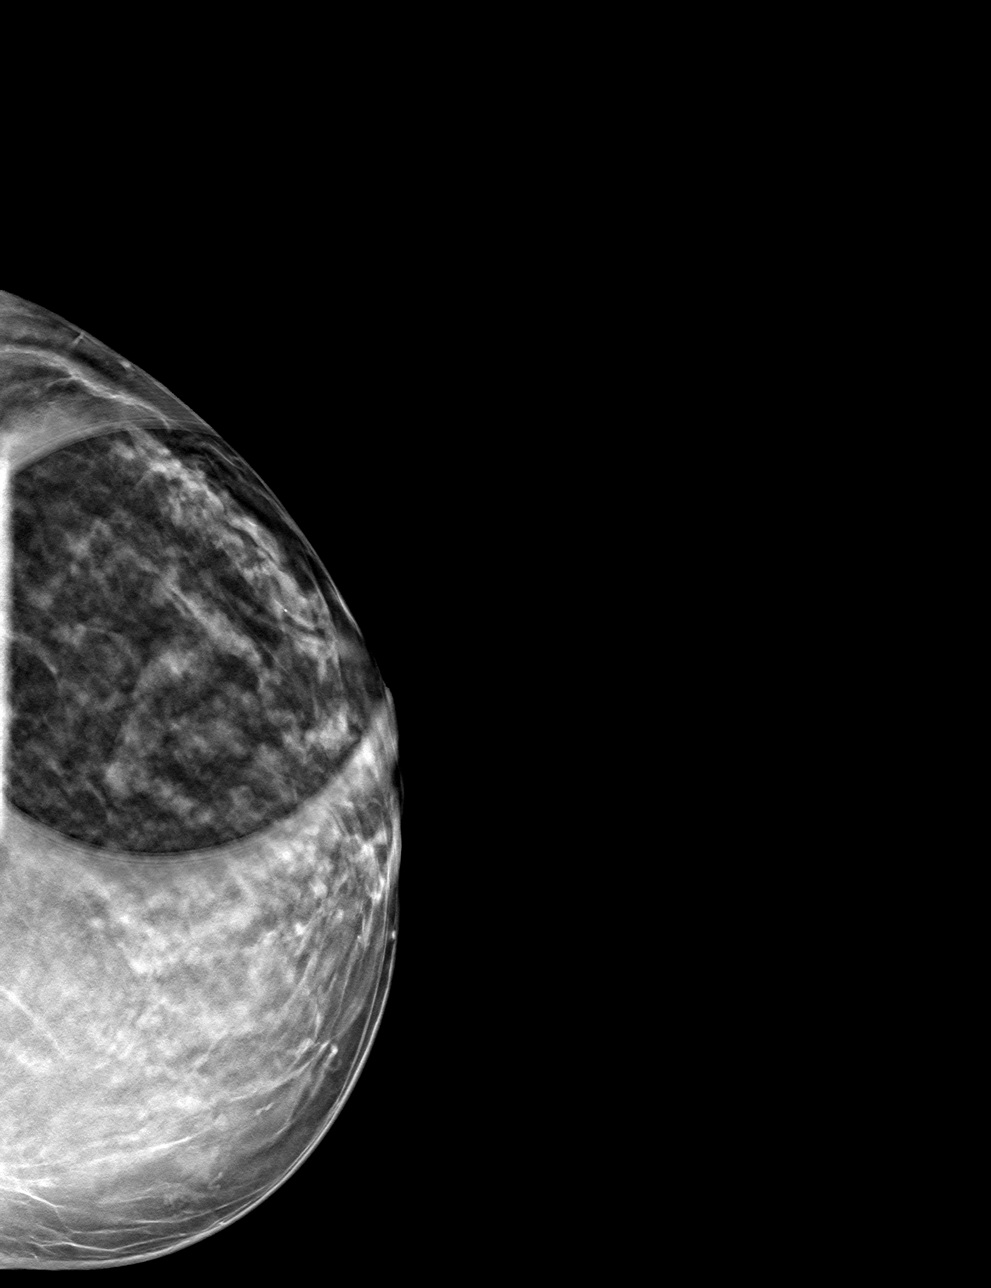

[L MLO tomo · tomo slice 26/51.0]
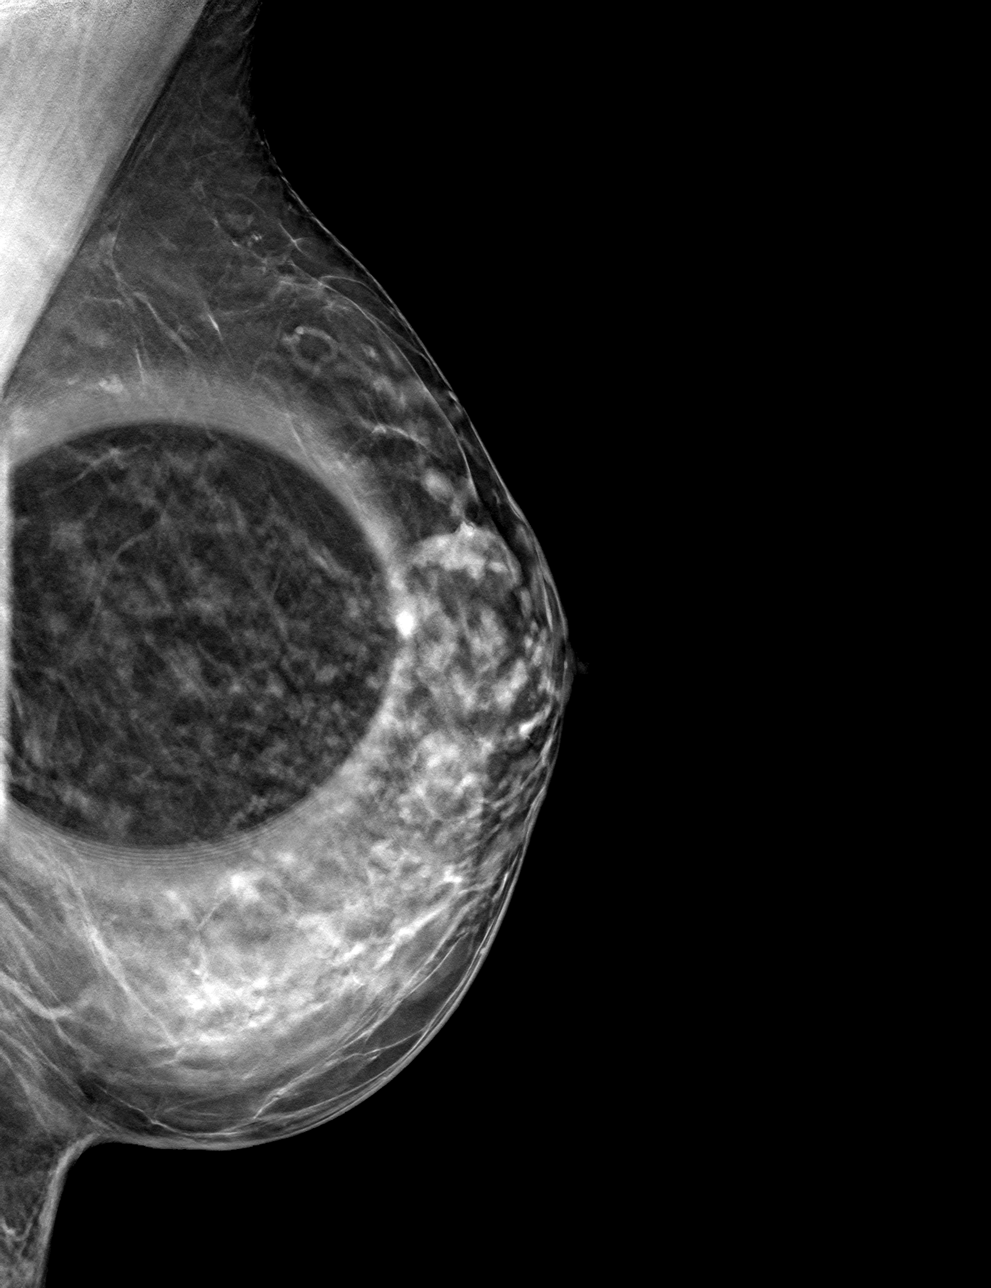

[4 of 12 positions shown; findings below may reference images not displayed]

ACR Breast Density Category c: The breast tissue is heterogeneously
dense, which may obscure small masses.
FINDINGS: On today's additional diagnostic views with spot compression and 3D
tomosynthesis, there is no persistent asymmetry within the outer
LEFT breast indicating superimposition of normal dense
fibroglandular tissues. There are no dominant masses, suspicious
calcifications or secondary signs of malignancy identified within
the LEFT breast on today's diagnostic exam.

Mammographic images were processed with CAD.
IMPRESSION: No evidence of malignancy. Patient may return to routine annual
bilateral screening mammogram schedule.

RECOMMENDATION:
Screening mammogram in one year.(Code:[id number])

I have discussed the findings and recommendations with the patient.
Results were also provided in writing at the conclusion of the
visit. If applicable, a reminder letter will be sent to the patient
regarding the next appointment.

BI-RADS CATEGORY  1: Negative.

## 2019-09-13 ENCOUNTER — Ambulatory Visit: Payer: BLUE CROSS/BLUE SHIELD | Attending: Family Medicine

## 2019-10-01 ENCOUNTER — Other Ambulatory Visit: Payer: Self-pay | Admitting: Physician Assistant

## 2019-11-15 ENCOUNTER — Other Ambulatory Visit: Payer: Self-pay | Admitting: Family Medicine

## 2019-11-15 MED ORDER — PHENAZOPYRIDINE HCL 100 MG PO TABS: 100.0000 mg | ORAL_TABLET | Freq: Three times a day (TID) | ORAL | 0 refills | Status: DC | PRN

## 2019-11-15 MED ORDER — SULFAMETHOXAZOLE-TRIMETHOPRIM 800-160 MG PO TABS: 1.0000 | ORAL_TABLET | Freq: Two times a day (BID) | ORAL | 0 refills | Status: AC

## 2019-11-15 NOTE — Progress Notes (Signed)
Has sx's of UTI-->U/A has + blood and Leukocytes. Rx sent in.

## 2019-11-15 NOTE — Addendum Note (Signed)
Addended by: Donnamae Jude on: 11/15/2019 08:23 AM   Modules accepted: Orders

## 2020-01-13 ENCOUNTER — Other Ambulatory Visit: Payer: Self-pay | Admitting: Family Medicine

## 2020-01-13 DIAGNOSIS — I10 Essential (primary) hypertension: Secondary | ICD-10-CM

## 2020-01-16 ENCOUNTER — Encounter: Payer: Self-pay | Admitting: *Deleted

## 2020-02-07 IMAGING — DX RIGHT FOOT COMPLETE - 3+ VIEW
3 series · 3 of 3 positions shown · non-contrast
Comparison: None.

CLINICAL DATA: Pain and bruising involving the dorsum of the foot
after dropping a table on foot yesterday.

EXAM:
RIGHT FOOT COMPLETE - 3+ VIEW

[foot ap]
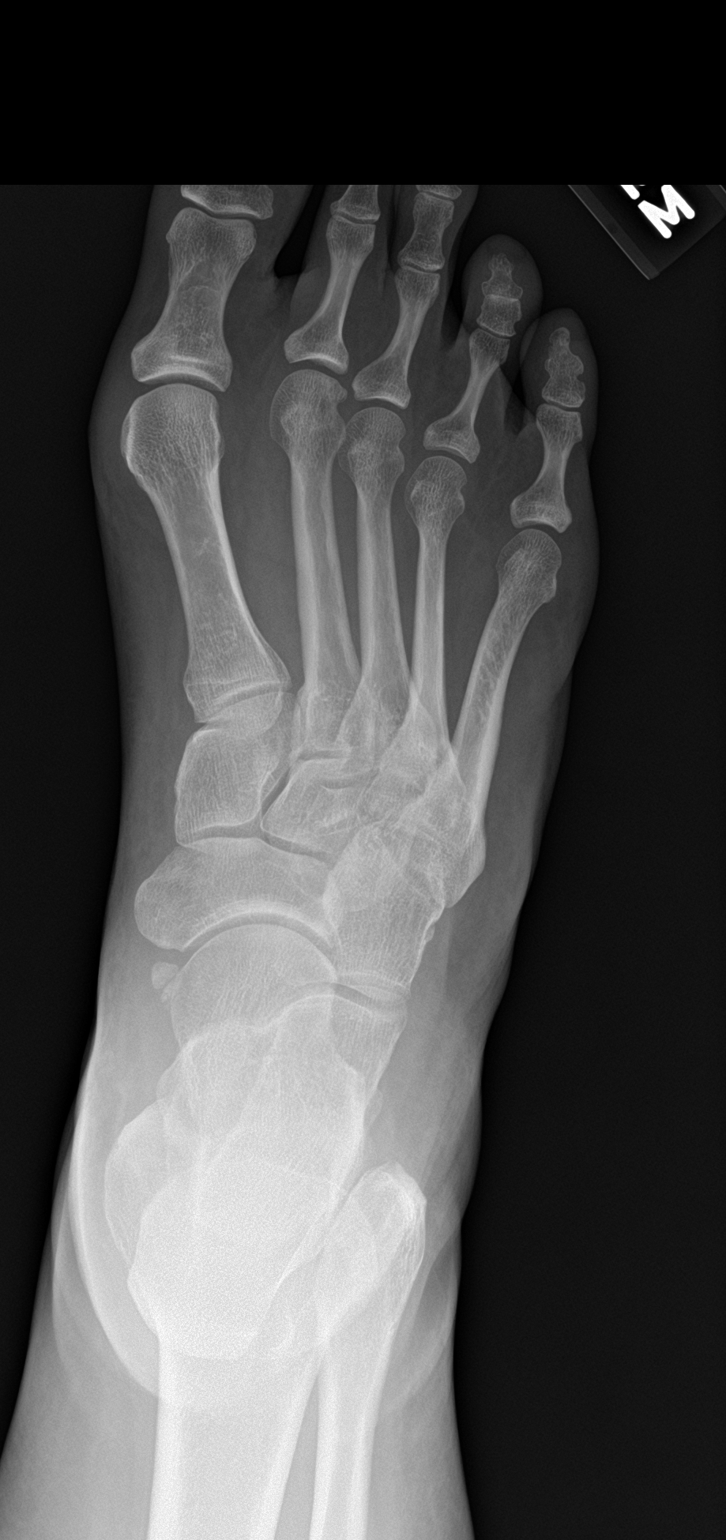

[foot obl]
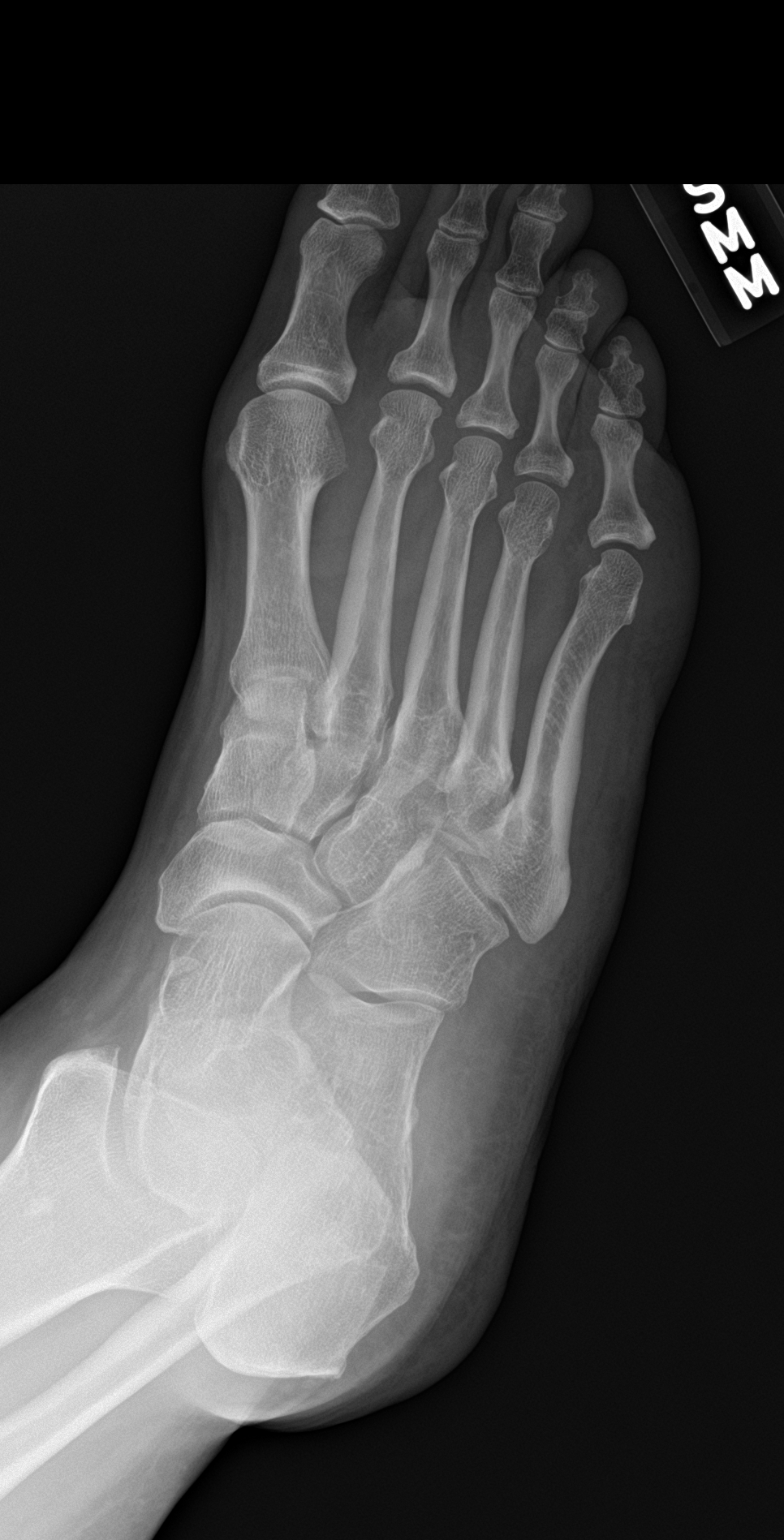

[foot lat]
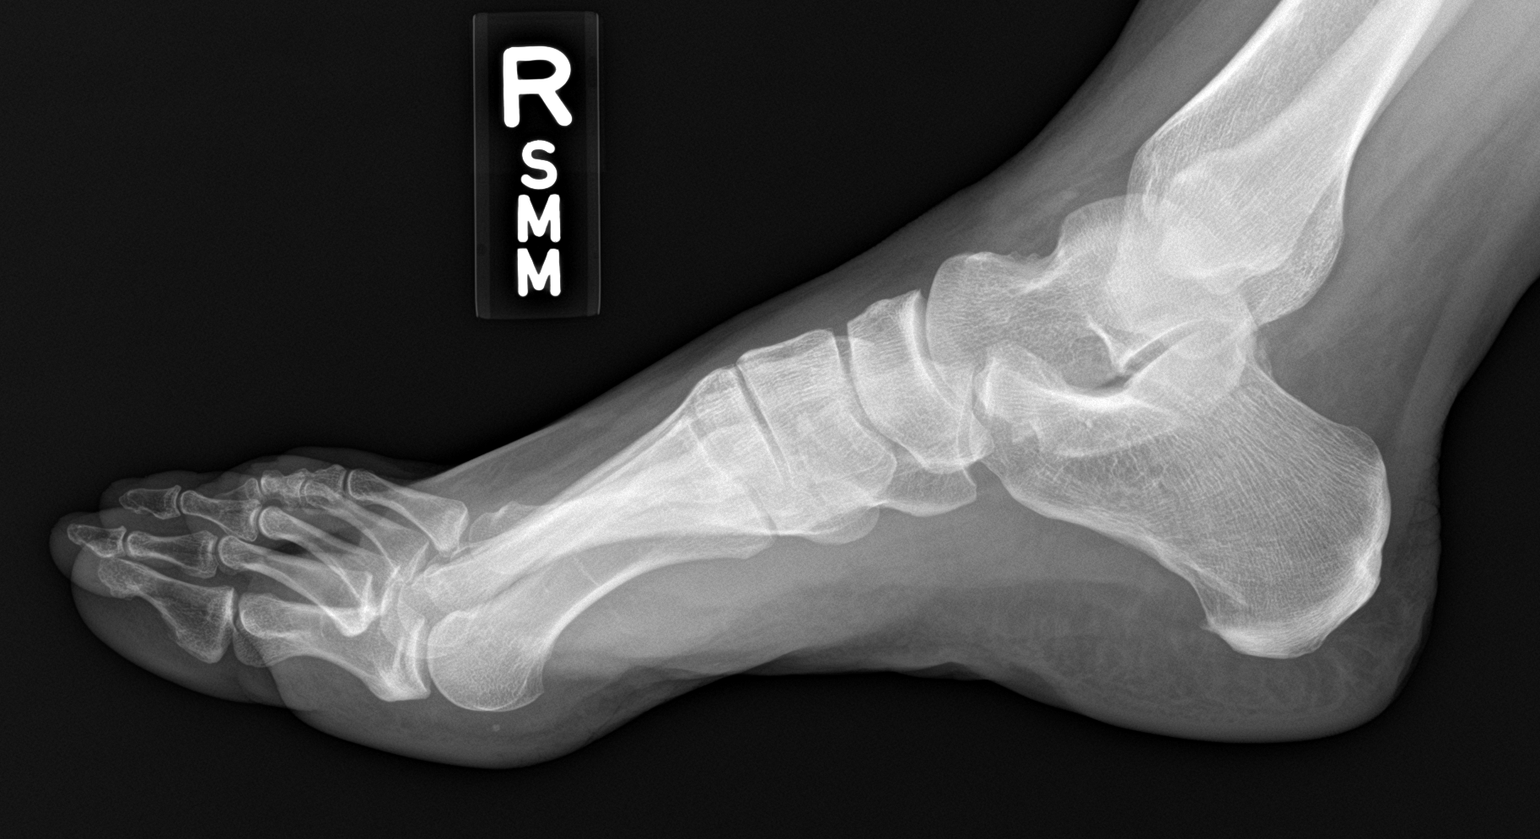

[3 of 3 positions shown; findings below may reference images not displayed]

FINDINGS: The mineralization and alignment are normal. There is no evidence of
acute fracture or dislocation. The joint spaces are preserved. There
is a mild hallux valgus deformity. No focal soft tissue swelling or
foreign body identified. Small accessory navicular noted.
IMPRESSION: No evidence of acute fracture or dislocation.

## 2020-02-17 ENCOUNTER — Other Ambulatory Visit: Payer: Self-pay | Admitting: Physician Assistant

## 2020-02-17 DIAGNOSIS — I10 Essential (primary) hypertension: Secondary | ICD-10-CM

## 2020-02-17 MED ORDER — OLMESARTAN MEDOXOMIL-HCTZ 20-12.5 MG PO TABS: 1.0000 | ORAL_TABLET | Freq: Every day | ORAL | 3 refills | Status: DC

## 2020-04-02 ENCOUNTER — Encounter: Payer: Self-pay | Admitting: Radiology

## 2020-06-11 ENCOUNTER — Other Ambulatory Visit: Payer: Self-pay | Admitting: Physician Assistant

## 2020-08-02 ENCOUNTER — Other Ambulatory Visit: Payer: Self-pay | Admitting: Physician Assistant

## 2021-01-22 ENCOUNTER — Encounter: Payer: Self-pay | Admitting: *Deleted

## 2021-02-13 ENCOUNTER — Encounter: Payer: Self-pay | Admitting: Physician Assistant

## 2021-02-13 ENCOUNTER — Ambulatory Visit (INDEPENDENT_AMBULATORY_CARE_PROVIDER_SITE_OTHER): Payer: No Typology Code available for payment source

## 2021-02-13 ENCOUNTER — Other Ambulatory Visit: Payer: Self-pay

## 2021-02-13 ENCOUNTER — Ambulatory Visit (INDEPENDENT_AMBULATORY_CARE_PROVIDER_SITE_OTHER): Payer: No Typology Code available for payment source | Admitting: Physician Assistant

## 2021-02-13 DIAGNOSIS — M25572 Pain in left ankle and joints of left foot: Secondary | ICD-10-CM

## 2021-02-13 DIAGNOSIS — S86312A Strain of muscle(s) and tendon(s) of peroneal muscle group at lower leg level, left leg, initial encounter: Secondary | ICD-10-CM | POA: Diagnosis not present

## 2021-02-13 MED ORDER — METHYLPREDNISOLONE 4 MG PO TABS: ORAL_TABLET | ORAL | 0 refills | Status: DC

## 2021-02-13 NOTE — Progress Notes (Signed)
Office Visit Note   Patient: Kimberly [name]           Date of Birth: 1970/07/05           MRN: [id number] Visit Date: 02/13/2021              Requested by: Donnamae Jude, MD [place],  [place] [address] PCP: Donnamae Jude, MD   Assessment & Plan: Visit Diagnoses:  1. Pain in left ankle and joints of left foot   2. Strain of peroneal tendon of left foot, initial encounter     Plan: She will continue to wear the cam walker boot at all times when up for the next 2 weeks.  Placed her on a Medrol Dosepak she is to stop all NSAIDs while on the Dosepak she can use Voltaren gel 2 g up to 4 times daily over the perineal region while on the Medrol Dosepak.  We will also send her to formal physical therapy for modalities range of motion strengthening left ankle.  Questions were encouraged and answered at length.  Follow-up with [place] in 4 weeks sooner if there is any questions or concerns.  Follow-Up Instructions: No follow-ups on file.   Orders:  Orders Placed This Encounter  Procedures   XR Ankle Complete Left   Meds ordered this encounter  Medications   methylPREDNISolone (MEDROL) 4 MG tablet    Sig: Take as directed    Dispense:  21 tablet    Refill:  0      Procedures: No procedures performed   Clinical Data: No additional findings.   Subjective: Chief Complaint  Patient presents with   Left Ankle - Pain    HPI Kimberly [name] is 51 year old female comes in today with left ankle pain has been ongoing for the past 3 weeks.  She states she was walking fast and someone called her name she turned really quickly and heard a pop had stabbing pain lateral aspect of her ankle.  She states initially she was able to bear weight but then over some time after walking without any boot or treatment her pain became worse.  Now burning sensation lateral aspect of ankle.  Her pain became so bad she was unable to put her foot down.  Describes the pain as electrical hot achy  pain.  She notes bruising lateral aspect of ankle.  She was then placed in a cam walker boot taking naproxen icing the ankle.  She states slowly the pain has improved but if she goes back into a regular shoe and tries to ambulate that she has pain lateral aspect the ankle after about an hour. Patient is nondiabetic. Review of Systems Negative for fevers chills.  Objective: Vital Signs: LMP 01/09/2017 (Exact Date)   Physical Exam General well-developed well-nourished female no acute distress mood and affect appropriate Psych: Alert and oriented x3 Vascular left dorsal pedal pulse 2+.  Left calf supple nontender Ortho Exam Left ankle slight edema over the lateral aspect of ankle.  Tenderness over the peroneal region just posterior to the lateral malleolus.  No tenderness over the medial malleolus or lateral malleolus.  Full dorsiflexion plantarflexion of the left ankle without pain.  Nontender over the Achilles.  E version of the left foot against resistance reveals 5 out of 5 strength but it causes pain over the peroneal region.  Inversion against resistance 5 out of 5 extremes of inversion callus pain lateral ankle in the region of the  peroneal tendons posterior to lateral malleolus. Specialty Comments:  No specialty comments available.  Imaging: XR Ankle Complete Left  Result Date: 02/13/2021 Left ankle 3 views: No acute fracture.  Talus well located within the ankle mortise no diastases.  No bony abnormalities or lesions.    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   Past Medical History:  Diagnosis Date   Anorectal fistula    Anxiety    Arthritis     neck, hips   Chronic headaches    migraines   History of hypertension    since wt loss after gastric sleeve 2014--  no longer issue   Irritable bowel syndrome    PONV (postoperative nausea and vomiting)    slow to wake up   Wears glasses     Family History  Problem Relation Age of Onset   Colitis Father    Crohn's disease Father    Diabetes Father    Diverticulitis Father    Diabetes Mother    Uterine cancer Mother 32   Cancer Mother        endometrial   Ovarian cancer Paternal Grandmother    Cancer Paternal Grandmother 86       ovarian   Diabetes Sister    Uterine cancer Sister 32   Cancer Sister        endometrial   Irritable bowel syndrome Sister    Irritable bowel syndrome Brother    Colon cancer Paternal Uncle    Cancer Maternal Uncle        lung    Past Surgical History:  Procedure Laterality Date   BREAST REDUCTION SURGERY  1996   CESAREAN SECTION  2001   COLONOSCOPY  10-18-2010   w/ bx's   ESOPHAGOGASTRODUODENOSCOPY N/A 06/29/2012   Procedure: ESOPHAGOGASTRODUODENOSCOPY (EGD);  Surgeon: Madilyn Hook, DO;  Location: [facility];  Service: General;  Laterality: N/A;   EVALUATION UNDER ANESTHESIA WITH ANAL FISTULECTOMY N/A 04/14/2014   Procedure: ANAL EXAM UNDER ANESTHESIA WITH  FISTULOTOMY;  Surgeon: [name], MD;  Location: [facility];  Service: General;  Laterality: N/A;   LAPAROSCOPIC BILATERAL SALPINGECTOMY N/A 06/29/2012   Procedure: LAPAROSCOPIC BILATERAL SALPINGECTOMY;  Surgeon: Donnamae Jude, MD;  Location: [facility];  Service: Gynecology;  Laterality: N/A;   LAPAROSCOPIC GASTRIC SLEEVE RESECTION N/A 06/29/2012   Procedure: LAPAROSCOPIC GASTRIC SLEEVE RESECTION;  Surgeon: Madilyn Hook, DO;  Location: [facility];  Service: General;  Laterality: N/A;  Laparoscopic Sleeve Gastrectomy with EGD   REDUCTION MAMMAPLASTY Bilateral    VAGINAL HYSTERECTOMY N/A 01/13/2017   Procedure: HYSTERECTOMY VAGINAL;  Surgeon: Donnamae Jude, MD;  Location: [facility];   Service: Gynecology;  Laterality: N/A;   Social History   Occupational History   Occupation: Psychologist, sport and exercise  Tobacco Use   Smoking status: Never   Smokeless tobacco: Never  Vaping Use   Vaping Use: Never used  Substance and Sexual Activity   Alcohol use: Yes    Comment: occasional   Drug use: No   Sexual activity: Not Currently    Birth control/protection: None

## 2021-02-14 NOTE — Addendum Note (Signed)
Addended by: Robyne Peers on: 02/14/2021 08:26 AM   Modules accepted: Orders

## 2021-02-17 ENCOUNTER — Other Ambulatory Visit: Payer: Self-pay | Admitting: Physician Assistant

## 2021-02-17 MED ORDER — METHYLPREDNISOLONE 4 MG PO TABS: ORAL_TABLET | ORAL | 0 refills | Status: DC

## 2021-02-17 NOTE — Progress Notes (Signed)
Patient's dog ate medication

## 2021-02-20 NOTE — Therapy (Incomplete)
OUTPATIENT PHYSICAL THERAPY LOWER EXTREMITY EVALUATION   Patient Name: Kimberly [name] MRN: [id number] DOB:August 23, 1970, 51 y.o., female Today's Date: 02/20/2021    Past Medical History:  Diagnosis Date   Anorectal fistula    Anxiety    Arthritis    neck, hips   Chronic headaches    migraines   History of hypertension    since wt loss after gastric sleeve 2014--  no longer issue   Irritable bowel syndrome    PONV (postoperative nausea and vomiting)    slow to wake up   Wears glasses    Past Surgical History:  Procedure Laterality Date   [place]  2001   COLONOSCOPY  10-18-2010   w/ bx's   ESOPHAGOGASTRODUODENOSCOPY N/A 06/29/2012   Procedure: ESOPHAGOGASTRODUODENOSCOPY (EGD);  Surgeon: Madilyn Hook, DO;  Location: [facility];  Service: General;  Laterality: N/A;   EVALUATION UNDER ANESTHESIA WITH ANAL FISTULECTOMY N/A 04/14/2014   Procedure: ANAL EXAM UNDER ANESTHESIA WITH  FISTULOTOMY;  Surgeon: [name], MD;  Location: [facility];  Service: General;  Laterality: N/A;   LAPAROSCOPIC BILATERAL SALPINGECTOMY N/A 06/29/2012   Procedure: LAPAROSCOPIC BILATERAL SALPINGECTOMY;  Surgeon: Donnamae Jude, MD;  Location: [facility];  Service: Gynecology;  Laterality: N/A;   LAPAROSCOPIC GASTRIC SLEEVE RESECTION N/A 06/29/2012   Procedure: LAPAROSCOPIC GASTRIC SLEEVE RESECTION;  Surgeon: Madilyn Hook, DO;  Location: [facility];  Service: General;  Laterality: N/A;  Laparoscopic Sleeve Gastrectomy with EGD   REDUCTION MAMMAPLASTY Bilateral    VAGINAL HYSTERECTOMY N/A 01/13/2017   Procedure: HYSTERECTOMY VAGINAL;  Surgeon: Donnamae Jude, MD;  Location: [facility];  Service: Gynecology;  Laterality: N/A;   Patient Active Problem List   Diagnosis Date Noted   Muscle spasm 11/13/2017   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   Migraine without aura 09/13/2010   ANA positive 09/13/2010   Polyarthralgia 09/13/2010   Essential  hypertension 01/11/2009   DE QUERVAIN'S TENOSYNOVITIS 01/26/2008   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    PCP: Donnamae Jude, MD  REFERRING PROVIDER: Pete Pelt, PA-C  REFERRING DIAG: [phone] (ICD-10-CM) - Pain in left ankle and joints of left foot S86.312A (ICD-10-CM) - Strain of peroneal tendon of left foot, initial encounter   THERAPY DIAG:  No diagnosis found.  ONSET DATE: ***  SUBJECTIVE:   SUBJECTIVE STATEMENT: ***  PERTINENT HISTORY: Anxiety, OA, hx gastric sleeve  PAIN:  Are you having pain? {yes/no:20286} NPRS scale: ***/10 Pain location: *** Pain orientation: {Pain Orientation:25161}  PAIN TYPE: {type:313116} Pain description: {PAIN DESCRIPTION:21022940}  Aggravating factors: *** Relieving factors: ***  PRECAUTIONS: {Therapy precautions:24002}  WEIGHT BEARING RESTRICTIONS {Yes ***/No:24003}  FALLS:  Has patient fallen in last 6 months? {yes/no:20286}, Number of falls: ***  LIVING ENVIRONMENT: Lives with: {[facility] lives with:25569::"lives with their family"} Lives in: {Lives in:25570} Stairs: {yes/no:20286}; {Stairs:24000} Has following equipment at home: {Assistive devices:23999}  OCCUPATION: ***  PLOF: {PLOF:24004}  PATIENT GOALS ***   OBJECTIVE:   DIAGNOSTIC FINDINGS: ***  PATIENT SURVEYS:  02/21/21 FOTO ***  COGNITION:  Overall cognitive status: {cognition:24006}     SENSATION:  Light touch: {intact/deficits:24005}  Stereognosis: {intact/deficits:24005}  Hot/Cold: {intact/deficits:24005}  Proprioception: {intact/deficits:24005}  MUSCLE LENGTH: Hamstrings: Right *** deg; Left *** deg Thomas test: Right *** deg; Left *** deg  POSTURE:  ***  PALPATION: ***  LE AROM/PROM:  A/PROM Right 02/20/2021 Left 02/20/2021  Hip flexion    Hip extension    Hip abduction  Hip adduction    Hip internal rotation    Hip external rotation    Knee flexion    Knee extension    Ankle dorsiflexion    Ankle  plantarflexion    Ankle inversion    Ankle eversion     (Blank rows = not tested)  LE MMT:  MMT Right 02/20/2021 Left 02/20/2021  Hip flexion    Hip extension    Hip abduction    Hip adduction    Hip internal rotation    Hip external rotation    Knee flexion    Knee extension    Ankle dorsiflexion    Ankle plantarflexion    Ankle inversion    Ankle eversion     (Blank rows = not tested)  LOWER EXTREMITY SPECIAL TESTS:  {LEspecialtests:26242}  FUNCTIONAL TESTS:  {Functional tests:24029}  GAIT: Distance walked: *** Assistive device utilized: {Assistive devices:23999} Level of assistance: {Levels of assistance:24026} Comments: ***    TODAY'S TREATMENT: ***   PATIENT EDUCATION:  Education details: *** Person educated: {Person educated:25204} Education method: {Education Method:25205} Education comprehension: {Education Comprehension:25206}   HOME EXERCISE PROGRAM: ***  ASSESSMENT:  CLINICAL IMPRESSION: Patient is a 51 y.o.female who was seen today for physical therapy evaluation and treatment for Lt ankle pain.    OBJECTIVE IMPAIRMENTS {opptimpairments:25111}.   ACTIVITY LIMITATIONS {activity limitations:25113}.   PERSONAL FACTORS {Personal factors:25162} are also affecting patient's functional outcome.    REHAB POTENTIAL: {rehabpotential:25112}  CLINICAL DECISION MAKING: {clinical decision making:25114}  EVALUATION COMPLEXITY: {Evaluation complexity:25115}  GOALS: Goals reviewed with patient? {yes/no:20286}  SHORT TERM GOALS:  STG Name Target Date Goal status  1 Independent with initial HEP Baseline:  {follow up:25551} {GOALSTATUS:25110}  2 *** Baseline:  {follow up:25551} {GOALSTATUS:25110}  3 *** Baseline: {follow up:25551} {GOALSTATUS:25110}   LONG TERM GOALS:   LTG Name Target Date Goal status  1 Independent with final HEP Baseline: {follow up:25551} {GOALSTATUS:25110}  2 FOTO score improved to *** for improved  function Baseline: {follow up:25551} {GOALSTATUS:25110}  3 *** improved to *** for improved function Baseline: {follow up:25551} {GOALSTATUS:25110}  4 Report pain < ***/10 with ***  Baseline: {follow up:25551} {GOALSTATUS:25110}  5 *** Baseline: {follow up:25551} {GOALSTATUS:25110}  6 *** Baseline: {follow up:25551} {GOALSTATUS:25110}  7 *** Baseline: {follow up:25551} {GOALSTATUS:25110}     PLAN: PT FREQUENCY: {rehab frequency:25116}  PT DURATION: {rehab duration:25117}  PLANNED INTERVENTIONS: {rehab planned interventions:25118::"Therapeutic exercises","Therapeutic activity","Neuro Muscular re-education","Balance training","Gait training","Patient/Family education","Joint mobilization"}  PLAN FOR NEXT SESSION: Faustino Congress, PT 02/20/2021, 12:24 PM

## 2021-02-21 ENCOUNTER — Other Ambulatory Visit: Payer: Self-pay

## 2021-02-21 ENCOUNTER — Ambulatory Visit (INDEPENDENT_AMBULATORY_CARE_PROVIDER_SITE_OTHER): Payer: No Typology Code available for payment source | Admitting: Rehabilitative and Restorative Service Providers"

## 2021-02-21 ENCOUNTER — Encounter: Payer: Self-pay | Admitting: Physical Therapy

## 2021-02-21 DIAGNOSIS — R262 Difficulty in walking, not elsewhere classified: Secondary | ICD-10-CM

## 2021-02-21 DIAGNOSIS — M25572 Pain in left ankle and joints of left foot: Secondary | ICD-10-CM

## 2021-02-21 DIAGNOSIS — M6281 Muscle weakness (generalized): Secondary | ICD-10-CM

## 2021-02-21 DIAGNOSIS — R600 Localized edema: Secondary | ICD-10-CM

## 2021-02-21 NOTE — Therapy (Addendum)
OUTPATIENT PHYSICAL THERAPY LOWER EXTREMITY EVALUATION   Patient Name: Kimberly [name] MRN: [id number] DOB:01-27-1970, 51 y.o., female Today's Date: 02/21/2021   PT End of Session - 02/21/21 1426     Visit Number 1    Number of Visits 20    Date for PT Re-Evaluation 05/02/21    Authorization Type [employer] all saves no copay    PT Start Time 1430    PT Stop Time 1508    PT Time Calculation (min) 38 min    Activity Tolerance Patient tolerated treatment well    Behavior During Therapy WFL for tasks assessed/performed             Past Medical History:  Diagnosis Date   Anorectal fistula    Anxiety    Arthritis    neck, hips   Chronic headaches    migraines   History of hypertension    since wt loss after gastric sleeve 2014--  no longer issue   Irritable bowel syndrome    PONV (postoperative nausea and vomiting)    slow to wake up   Wears glasses    Past Surgical History:  Procedure Laterality Date   [place] SECTION  2001   COLONOSCOPY  10-18-2010   w/ bx's   ESOPHAGOGASTRODUODENOSCOPY N/A 06/29/2012   Procedure: ESOPHAGOGASTRODUODENOSCOPY (EGD);  Surgeon: Madilyn Hook, DO;  Location: [facility];  Service: General;  Laterality: N/A;   EVALUATION UNDER ANESTHESIA WITH ANAL FISTULECTOMY N/A 04/14/2014   Procedure: ANAL EXAM UNDER ANESTHESIA WITH  FISTULOTOMY;  Surgeon: [name], MD;  Location: [facility];  Service: General;  Laterality: N/A;   LAPAROSCOPIC BILATERAL SALPINGECTOMY N/A 06/29/2012   Procedure: LAPAROSCOPIC BILATERAL SALPINGECTOMY;  Surgeon: Donnamae Jude, MD;  Location: [facility];  Service: Gynecology;  Laterality: N/A;   LAPAROSCOPIC GASTRIC SLEEVE RESECTION N/A 06/29/2012   Procedure: LAPAROSCOPIC GASTRIC SLEEVE RESECTION;  Surgeon: Madilyn Hook, DO;  Location: [facility];  Service: General;  Laterality: N/A;  Laparoscopic Sleeve Gastrectomy with EGD   REDUCTION MAMMAPLASTY Bilateral    VAGINAL HYSTERECTOMY N/A 01/13/2017    Procedure: HYSTERECTOMY VAGINAL;  Surgeon: Donnamae Jude, MD;  Location: [facility];  Service: Gynecology;  Laterality: N/A;   Patient Active Problem List   Diagnosis Date Noted   Muscle spasm 11/13/2017   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   Migraine without aura 09/13/2010   ANA positive 09/13/2010   Polyarthralgia 09/13/2010   Essential hypertension 01/11/2009   DE QUERVAIN'S TENOSYNOVITIS 01/26/2008   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    PCP: Donnamae Jude, MD  REFERRING PROVIDER: Pete Pelt, PA-C  REFERRING DIAG: [phone] (ICD-10-CM) - Pain in left ankle and joints of left foot S86.312A (ICD-10-CM) - Strain of peroneal tendon of left foot, initial encounter  THERAPY DIAG:  Pain in left ankle and joints of left foot  Muscle weakness (generalized)  Difficulty in walking, not elsewhere classified  Localized edema  ONSET DATE: 01/24/2021  SUBJECTIVE:   SUBJECTIVE STATEMENT: Pt indicated about 4 weeks or so ago she was walking down the hallway and turned quickly and felt/heard a pop in Lt ankle.  Pt stated swollen the next day.  She indicated in next week it was hurting more and having sharp pain with normal walking.  She then saw ortho MD.  Has been wearing boot on Lt ankle.  Patient indicated a lot of walking at work and can notice pain/ache at end of  the day. Pt indicated finishing steroid pack yesterday and felt burning return recently.   PERTINENT HISTORY: History of various ankle sprains in past. HTN, IBS, histor of migraines  PAIN:  Are you having pain? Yes NPRS scale: at worst in last week 6/10.  Current 4/10.   Pain location: ankle Pain orientation: Left, lateral PAIN TYPE: acute Pain description: achy, burning Aggravating factors: walking, standing Relieving factors: boot, medicine helped, ice/elevation   WEIGHT BEARING RESTRICTIONS  02/21/2021: Yes  WB in CAM walker boot 2 weeks from 02/13/2021  FALLS:   Has patient fallen in last 6 months? 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  PLOF: Independent, has gym access, yard/house work  PATIENT GOALS Reduce pain   OBJECTIVE:     PATIENT SURVEYS:  02/21/2021: FOTO initial  54  predicted  70      SENSATION: 02/21/2021: [place] touch: Appears intact      Localized edema Lt ankle 02/21/2021: Figure 8 Lt: 19.5 inches   Rt: 19.25 inches   PALPATION: Mild tenderness path of peroneals Lt ankle around malleolus  LE AROM/PROM:   02/21/2021: measured in seated 90 knee flexion AROM Right 02/21/2021 Left 02/21/2021  Hip flexion    Hip extension    Hip abduction    Hip adduction    Hip internal rotation    Hip external rotation    Knee flexion    Knee extension    Ankle dorsiflexion 20 -3 c pain  Ankle plantarflexion 58 38 c pain  Ankle inversion 35 25 c pain  Ankle eversion 23 15 c pain   (Blank rows = not tested)  LE MMT:  MMT Right 02/21/2021 Left 02/21/2021  Hip flexion    Hip extension    Hip abduction    Hip adduction    Hip internal rotation    Hip external rotation    Knee flexion    Knee extension    Ankle dorsiflexion 5/5 3+/5  Ankle plantarflexion 5/5 2+/5  Ankle inversion 5/5 4/5  Ankle eversion 5/5 3+/5   (Blank rows = not tested)  LOWER EXTREMITY SPECIAL TESTS:  02/21/2021: none performed  FUNCTIONAL TESTS:  02/21/2021: none tested today.  Will plan for SLS, stair in future when WB allowed outside of boot.   GAIT: 02/21/2021: community distances c walker boot on Lt leg    TODAY'S TREATMENT: 02/21/2021:  Therex:  HEP instruction/performance c cues for techniques, handout provided.  Trial set performed of each for comprehension and symptom assessment. See below for listing of exercises included.  Vasopneumatic: Lt ankle 10 mins 34 degrees moderate compression    PATIENT EDUCATION:  02/21/2021  Education details: HEP, POC Person educated: Patient Education method: Explanation, Demonstration, Verbal cues, and Handouts Education comprehension: verbalized understanding and returned demonstration   HOME EXERCISE PROGRAM: Access Code: [email or web address] URL: [email or web address] Date: 02/21/2021 Prepared by: Scot Jun  Exercises Ankle Pumps in Elevation - 2 x daily - 7 x weekly - 1-2 sets - 10 reps Ankle Alphabet in Elevation - 2 x daily - 7 x weekly - 1-2 sets - 26 reps Supine Ankle Inversion Eversion AROM (Mirrored) - 1-2 x daily - 7 x weekly - 1-2 sets - 10 reps Isometric Ankle Eversion at Wall - 1-2 x daily - 7 x weekly - 1 sets - 10 reps - 5-10 hold Isometric Ankle Inversion at Wall (Mirrored) - 1-2 x daily -  7 x weekly - 1 sets - 10 reps - 5-10 hold Isometric Ankle Dorsiflexion and Plantarflexion - 1-2 x daily - 7 x weekly - 1 sets - 10 reps - 5-10 hold   ASSESSMENT:  CLINICAL IMPRESSION: Patient is a 51  y.o. who comes to clinic with complaints of Lt ankle pain with mobility, strength and movement coordination deficits that impair their ability to perform usual daily and recreational functional activities without increase difficulty/symptoms at this time.  Patient to benefit from skilled PT services to address impairments and limitations to improve to previous level of function without restriction secondary to condition.    OBJECTIVE IMPAIRMENTS Abnormal gait, decreased balance, decreased coordination, decreased endurance, decreased mobility, difficulty walking, decreased ROM, decreased strength, hypomobility, increased edema, impaired flexibility, improper body mechanics, postural dysfunction, and pain.   ACTIVITY LIMITATIONS cleaning, community activity, occupation, yard work, and exercise routine .    REHAB POTENTIAL: Good  CLINICAL DECISION MAKING: Stable/uncomplicated  EVALUATION COMPLEXITY: Low   GOALS: Goals reviewed with patient? Yes  SHORT TERM  GOALS:  STG Name Target Date Goal status  1 Patient will demonstrate independent use of home exercise program to maintain progress from in clinic treatments.  03/14/2021 INITIAL                                 LONG TERM GOALS:   LTG Name Target Date Goal status  1 Patient will demonstrate/report pain at worst less than or equal to 2/10 to facilitate minimal limitation in daily activity secondary to pain symptoms.  05/02/2021 INITIAL  2 Patient will demonstrate independent use of home exercise program to facilitate ability to maintain/progress functional gains from skilled physical therapy services.  05/02/2021 INITIAL  3 Patient will demonstrate independent ambulation community distances > 300 ft to facilitate community integration at PLOF.  05/02/2021 INITIAL  4 Patient will demonstrate FOTO outcome > or = 70% to indicated reduced disability due to condition. 05/02/2021 INITIAL  5 Patient will demonstrate B SLS > or = 30 seconds to facilitate stability in ambulation on even/uneven surfaces to reduce fall risk. 05/02/2021 INITIAL  6 Patient will demonstrate Lt ankle AROM DF > 10 degrees, inversion/eversion and PF equal to Rt to facilitate usual mobility and ambulation at PLOF.  05/02/2021 INITIAL  7 Patient will demonstrate Lt ankle MMT 5/5 throughout.  05/02/2021 INITIAL   PLAN: PT FREQUENCY: 1-2x/week  PT DURATION: 10 weeks  PLANNED INTERVENTIONS: Therapeutic exercises, Therapeutic activity, Neuro Muscular re-education, Balance training, Gait training, Patient/Family education, Joint mobilization, Stair training, DME instructions, Dry Needling, Electrical stimulation, Cryotherapy, Moist heat, Taping, Ultrasound, Ionotophoresis 4mg /ml Dexamethasone, and Manual therapy.  All included unless contraindicated  PLAN FOR NEXT SESSION: Review HEP.  Check WB testing (SLS, gait etc).  Lt ankle mobility gains in manual/there ex  Scot Jun, PT, DPT, OCS, ATC 02/21/21  3:36 PM

## 2021-03-06 ENCOUNTER — Other Ambulatory Visit: Payer: Self-pay

## 2021-03-06 ENCOUNTER — Ambulatory Visit (INDEPENDENT_AMBULATORY_CARE_PROVIDER_SITE_OTHER): Payer: No Typology Code available for payment source | Admitting: Rehabilitative and Restorative Service Providers"

## 2021-03-06 ENCOUNTER — Encounter: Payer: Self-pay | Admitting: Rehabilitative and Restorative Service Providers"

## 2021-03-06 DIAGNOSIS — M6281 Muscle weakness (generalized): Secondary | ICD-10-CM | POA: Diagnosis not present

## 2021-03-06 DIAGNOSIS — R600 Localized edema: Secondary | ICD-10-CM

## 2021-03-06 DIAGNOSIS — R262 Difficulty in walking, not elsewhere classified: Secondary | ICD-10-CM

## 2021-03-06 DIAGNOSIS — M25572 Pain in left ankle and joints of left foot: Secondary | ICD-10-CM | POA: Diagnosis not present

## 2021-03-06 NOTE — Therapy (Signed)
OUTPATIENT PHYSICAL THERAPY TREATMENT NOTE   Patient Name: Kimberly [name] MRN: [id number] DOB:[birth year], 51 y.o., female Today's Date: 03/06/2021  PCP: Donnamae Jude, MD REFERRING PROVIDER: Pete Pelt, PA-C   PT End of Session - 03/06/21 1431     Visit Number 2    Number of Visits 20    Date for PT Re-Evaluation 05/02/21    Authorization Type [employer] all saves no copay    PT Start Time 1430    PT Stop Time 1520    PT Time Calculation (min) 50 min    Activity Tolerance Patient tolerated treatment well    Behavior During Therapy WFL for tasks assessed/performed             Past Medical History:  Diagnosis Date   Anorectal fistula    Anxiety    Arthritis    neck, hips   Chronic headaches    migraines   History of hypertension    since wt loss after gastric sleeve 2014--  no longer issue   Irritable bowel syndrome    PONV (postoperative nausea and vomiting)    slow to wake up   Wears glasses    Past Surgical History:  Procedure Laterality Date   [place]  2001   COLONOSCOPY  10-18-2010   w/ bx's   ESOPHAGOGASTRODUODENOSCOPY N/A 06/29/2012   Procedure: ESOPHAGOGASTRODUODENOSCOPY (EGD);  Surgeon: Madilyn Hook, DO;  Location: [facility];  Service: General;  Laterality: N/A;   EVALUATION UNDER ANESTHESIA WITH ANAL FISTULECTOMY N/A 04/14/2014   Procedure: ANAL EXAM UNDER ANESTHESIA WITH  FISTULOTOMY;  Surgeon: [name], MD;  Location: [facility];  Service: General;  Laterality: N/A;   LAPAROSCOPIC BILATERAL SALPINGECTOMY N/A 06/29/2012   Procedure: LAPAROSCOPIC BILATERAL SALPINGECTOMY;  Surgeon: Donnamae Jude, MD;  Location: [facility];  Service: Gynecology;  Laterality: N/A;   LAPAROSCOPIC GASTRIC SLEEVE RESECTION N/A 06/29/2012   Procedure: LAPAROSCOPIC GASTRIC SLEEVE RESECTION;  Surgeon: Madilyn Hook, DO;  Location: [facility];  Service: General;  Laterality: N/A;  Laparoscopic Sleeve Gastrectomy with EGD   REDUCTION  MAMMAPLASTY Bilateral    VAGINAL HYSTERECTOMY N/A 01/13/2017   Procedure: HYSTERECTOMY VAGINAL;  Surgeon: Donnamae Jude, MD;  Location: [facility];  Service: Gynecology;  Laterality: N/A;   Patient Active Problem List   Diagnosis Date Noted   Muscle spasm 11/13/2017   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   Migraine without aura 09/13/2010   ANA positive 09/13/2010   Polyarthralgia 09/13/2010   Essential hypertension 01/11/2009   DE QUERVAIN'S TENOSYNOVITIS 01/26/2008   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    REFERRING DIAG: M25.572 (ICD-10-CM) - Pain in left ankle and joints of left foot S86.312A (ICD-10-CM) - Strain of peroneal tendon of left foot, initial encounter  REFERRING PROVIDER: Pete Pelt, PA-C  ONSET DATE: 01/24/2021  THERAPY DIAG:  Pain in left ankle and joints of left foot  Muscle weakness (generalized)  Difficulty in walking, not elsewhere classified  Localized edema  PERTINENT HISTORY: History of various ankle sprains in past. HTN, IBS, histor of migraines  PRECAUTIONS: WEIGHT BEARING RESTRICTIONS  02/21/2021: Yes  WB in CAM walker boot 2 weeks from 02/13/2021  SUBJECTIVE: Pt. Indicated going last half week or so without wearing the boot.  Pt indicated feeling some complaints in outside of ankle/lower leg with walking out of the boot.   PAIN:  Are you having pain? Yes NPRS scale: 6/10  Pain location:  ankle Pain orientation: Left, lateral PAIN TYPE: acute Pain description: achy, burning, numbness Aggravating factors: walking, standing Relieving factors: ice     OBJECTIVE:        PATIENT SURVEYS:  02/21/2021: FOTO initial  54  predicted  70                       SENSATION: 02/21/2021: Light touch: Appears intact                 Localized edema Lt ankle 02/21/2021: Figure 8 Lt: 19.5 inches   Rt: 19.25 inches     PALPATION: Mild tenderness path of peroneals Lt ankle around malleolus   LE  AROM/PROM:                     02/21/2021: measured in seated 90 knee flexion AROM Right 02/21/2021 Left 02/21/2021 Left 03/06/2021 AROM measured in seated 90 knee flexion  Hip flexion       Hip extension       Hip abduction       Hip adduction       Hip internal rotation       Hip external rotation       Knee flexion       Knee extension       Ankle dorsiflexion 20 -3 c pain 12  Ankle plantarflexion 58 38 c pain 50  Ankle inversion 35 25 c pain 39  Ankle eversion 23 15 c pain 15 c pain end range   (Blank rows = not tested)   LE MMT:   MMT Right 02/21/2021 Left 02/21/2021  Hip flexion      Hip extension      Hip abduction      Hip adduction      Hip internal rotation      Hip external rotation      Knee flexion      Knee extension      Ankle dorsiflexion 5/5 3+/5  Ankle plantarflexion 5/5 2+/5  Ankle inversion 5/5 4/5  Ankle eversion 5/5 3+/5   (Blank rows = not tested)   LOWER EXTREMITY SPECIAL TESTS:  02/21/2021: none performed   FUNCTIONAL TESTS:  02/21/2021: none tested today.  Will plan for SLS, stair in future when WB allowed outside of boot.    GAIT: 02/21/2021: community distances c walker boot on Lt leg       TODAY'S TREATMENT: 03/06/2021:  Therex: Long sitting isometric painfree holds 10 sec x 6 in PF, DF, inversion and eversion.     Long sitting green band resistance x20 in PF, DF, inversion, eversion    Recumbent bike Lvl 2 6 mins   Manual: G2-g3 Lt ankle subtalar and talocrural mobs all directions  Neurore-ed Tandem stance 1 min x 2 bilateral   Retro step on Lt leg 20x for WB control improvements  Vasopneumatic:  Lt ankle 10 mins 34 degrees moderate compression    02/21/2021:  Therex:       HEP instruction/performance c cues for techniques, handout provided.  Trial set performed of each for comprehension and symptom assessment. See below for listing of exercises included.   Vasopneumatic:  Lt ankle 10 mins 34 degrees moderate compression       PATIENT EDUCATION:  02/21/2021  Education details: HEP, POC Person educated: Patient Education method: Explanation, Demonstration, Verbal cues, and Handouts Education comprehension: verbalized understanding and returned demonstration     HOME EXERCISE PROGRAM: Access Code:  [email or web address] URL: [email or web address] Date: 02/21/2021 Prepared by: Scot Jun   Exercises Ankle Pumps in Elevation - 2 x daily - 7 x weekly - 1-2 sets - 10 reps Ankle Alphabet in Elevation - 2 x daily - 7 x weekly - 1-2 sets - 26 reps Supine Ankle Inversion Eversion AROM (Mirrored) - 1-2 x daily - 7 x weekly - 1-2 sets - 10 reps Isometric Ankle Eversion at Wall - 1-2 x daily - 7 x weekly - 1 sets - 10 reps - 5-10 hold Isometric Ankle Inversion at Wall (Mirrored) - 1-2 x daily - 7 x weekly - 1 sets - 10 reps - 5-10 hold Isometric Ankle Dorsiflexion and Plantarflexion - 1-2 x daily - 7 x weekly - 1 sets - 10 reps - 5-10 hold Tandem stance 1 min x 2 bilateral     ASSESSMENT:   CLINICAL IMPRESSION: Complaints indicated by Pt most likely related to quick change to full WB outside boot with prolonged standing/walking activity. Most active movements improved for Lt ankle compared to evaluation but pain at end range still noted at various times c eversion most troublesome end range.      OBJECTIVE IMPAIRMENTS Abnormal gait, decreased balance, decreased coordination, decreased endurance, decreased mobility, difficulty walking, decreased ROM, decreased strength, hypomobility, increased edema, impaired flexibility, improper body mechanics, postural dysfunction, and pain.    ACTIVITY LIMITATIONS cleaning, community activity, occupation, yard work, and exercise routine .      REHAB POTENTIAL: Good   CLINICAL DECISION MAKING: Stable/uncomplicated   EVALUATION COMPLEXITY: Low     GOALS: Goals reviewed with patient? Yes   SHORT TERM GOALS:   STG Name Target Date Goal status  1 Patient will  demonstrate independent use of home exercise program to maintain progress from in clinic treatments.  03/14/2021 MET Assessed 03/06/2021                                                          LONG TERM GOALS:    LTG Name Target Date Goal status  1 Patient will demonstrate/report pain at worst less than or equal to 2/10 to facilitate minimal limitation in daily activity secondary to pain symptoms.  05/02/2021 On going Assessed 03/06/2021  2 Patient will demonstrate independent use of home exercise program to facilitate ability to maintain/progress functional gains from skilled physical therapy services.  05/02/2021 On going Assessed 03/06/2021  3 Patient will demonstrate independent ambulation community distances > 300 ft to facilitate community integration at PLOF.  05/02/2021 MET Assessed 03/06/2021  4 Patient will demonstrate FOTO outcome > or = 70% to indicated reduced disability due to condition. 05/02/2021 On going Assessed 03/06/2021  5 Patient will demonstrate B SLS > or = 30 seconds to facilitate stability in ambulation on even/uneven surfaces to reduce fall risk. 05/02/2021 On going Assessed 03/06/2021  6 Patient will demonstrate Lt ankle AROM DF > 10 degrees, inversion/eversion and PF equal to Rt to facilitate usual mobility and ambulation at PLOF.  05/02/2021 On going Assessed 03/06/2021  7 Patient will demonstrate Lt ankle MMT 5/5 throughout.  05/02/2021 On going Assessed 03/06/2021    PLAN: PT FREQUENCY: 1-2x/week   PT DURATION: 10 weeks   PLANNED INTERVENTIONS: Therapeutic exercises, Therapeutic activity, Neuro Muscular re-education, Balance training, Gait training, Patient/Family education, Joint mobilization, Stair training, DME instructions,  Dry Needling, Electrical stimulation, Cryotherapy, Moist heat, Taping, Ultrasound, Ionotophoresis 78m/ml Dexamethasone, and Manual therapy.  All included unless contraindicated   PLAN FOR NEXT SESSION: inclusion of WB strengthening as tolerated (leg  press, PF, step activity) progressive balance.   MScot Jun PT, DPT, OCS, ATC 03/06/21  3:05 PM

## 2021-03-08 ENCOUNTER — Encounter: Payer: Self-pay | Admitting: Rehabilitative and Restorative Service Providers"

## 2021-03-08 ENCOUNTER — Ambulatory Visit (INDEPENDENT_AMBULATORY_CARE_PROVIDER_SITE_OTHER): Payer: No Typology Code available for payment source | Admitting: Rehabilitative and Restorative Service Providers"

## 2021-03-08 ENCOUNTER — Other Ambulatory Visit: Payer: Self-pay

## 2021-03-08 DIAGNOSIS — R600 Localized edema: Secondary | ICD-10-CM | POA: Diagnosis not present

## 2021-03-08 DIAGNOSIS — M25572 Pain in left ankle and joints of left foot: Secondary | ICD-10-CM

## 2021-03-08 DIAGNOSIS — M6281 Muscle weakness (generalized): Secondary | ICD-10-CM

## 2021-03-08 DIAGNOSIS — R262 Difficulty in walking, not elsewhere classified: Secondary | ICD-10-CM | POA: Diagnosis not present

## 2021-03-08 NOTE — Therapy (Signed)
OUTPATIENT PHYSICAL THERAPY TREATMENT NOTE   Patient Name: Kimberly [name] MRN: [id number] DOB:Sep 18, 1970, 51 y.o., female Today's Date: 03/08/2021  PCP: Kimberly Jude, MD REFERRING PROVIDER: Pete Pelt, PA-C   PT End of Session - 03/08/21 1345     Visit Number 3    Number of Visits 20    Date for PT Re-Evaluation 05/02/21    Authorization Type [employer] all saves no copay    PT Start Time 1300    PT Stop Time 1351    PT Time Calculation (min) 51 min    Activity Tolerance Patient tolerated treatment well    Behavior During Therapy WFL for tasks assessed/performed              Past Medical History:  Diagnosis Date   Anorectal fistula    Anxiety    Arthritis    neck, hips   Chronic headaches    migraines   History of hypertension    since wt loss after gastric sleeve 2014--  no longer issue   Irritable bowel syndrome    PONV (postoperative nausea and vomiting)    slow to wake up   Wears glasses    Past Surgical History:  Procedure Laterality Date   [place]  2001   COLONOSCOPY  10-18-2010   w/ bx's   ESOPHAGOGASTRODUODENOSCOPY N/A 06/29/2012   Procedure: ESOPHAGOGASTRODUODENOSCOPY (EGD);  Surgeon: Kimberly Hook, DO;  Location: [facility];  Service: General;  Laterality: N/A;   EVALUATION UNDER ANESTHESIA WITH ANAL FISTULECTOMY N/A 04/14/2014   Procedure: ANAL EXAM UNDER ANESTHESIA WITH  FISTULOTOMY;  Surgeon: Kimberly Ruff, MD;  Location: [place];  Service: General;  Laterality: N/A;   LAPAROSCOPIC BILATERAL SALPINGECTOMY N/A 06/29/2012   Procedure: LAPAROSCOPIC BILATERAL SALPINGECTOMY;  Surgeon: Kimberly Jude, MD;  Location: [facility];  Service: Gynecology;  Laterality: N/A;   LAPAROSCOPIC GASTRIC SLEEVE RESECTION N/A 06/29/2012   Procedure: LAPAROSCOPIC GASTRIC SLEEVE RESECTION;  Surgeon: Kimberly Hook, DO;  Location: [facility];  Service: General;  Laterality: N/A;  Laparoscopic Sleeve Gastrectomy with EGD   REDUCTION  MAMMAPLASTY Bilateral    VAGINAL HYSTERECTOMY N/A 01/13/2017   Procedure: HYSTERECTOMY VAGINAL;  Surgeon: Kimberly Jude, MD;  Location: [facility];  Service: Gynecology;  Laterality: N/A;   Patient Active Problem List   Diagnosis Date Noted   Muscle spasm 11/13/2017   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   Migraine without aura 09/13/2010   ANA positive 09/13/2010   Polyarthralgia 09/13/2010   Essential hypertension 01/11/2009   DE QUERVAIN'S TENOSYNOVITIS 01/26/2008   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    REFERRING DIAG: M25.572 (ICD-10-CM) - Pain in left ankle and joints of left foot S86.312A (ICD-10-CM) - Strain of peroneal tendon of left foot, initial encounter  REFERRING PROVIDER: Pete Pelt, PA-C  ONSET DATE: 01/24/2021  THERAPY DIAG:  Difficulty in walking, not elsewhere classified  Pain in left ankle and joints of left foot  Muscle weakness (generalized)  Localized edema  PERTINENT HISTORY: History of various ankle sprains in past. HTN, IBS, histor of migraines  PRECAUTIONS: WEIGHT BEARING RESTRICTIONS  02/21/2021: Yes  WB in CAM walker boot 2 weeks from 02/13/2021  SUBJECTIVE: Kimberly [name] reports good HEP compliance.  She is wearing her ankle brace most of the day when WB.  PAIN:  Are you having pain? Yes NPRS scale: 4/10 (range of 2-8/10 this week) Pain location: ankle Pain orientation: Left, lateral PAIN TYPE:  acute Pain description: achy, burning, numbness Aggravating factors: walking, standing Relieving factors: ice   OBJECTIVE:        PATIENT SURVEYS:  02/21/2021: FOTO initial  54  predicted  70                       SENSATION: 02/21/2021: Light touch: Appears intact                 Localized edema Lt ankle 02/21/2021: Figure 8 Lt: 19.5 inches   Rt: 19.25 inches     PALPATION: Mild tenderness path of peroneals Lt ankle around malleolus   LE AROM/PROM:                     02/21/2021: measured in seated  90 knee flexion AROM Right 02/21/2021 Left 02/21/2021 Left 03/06/2021 AROM measured in seated 90 knee flexion  Hip flexion       Hip extension       Hip abduction       Hip adduction       Hip internal rotation       Hip external rotation       Knee flexion       Knee extension       Ankle dorsiflexion 20 -3 c pain 12  Ankle plantarflexion 58 38 c pain 50  Ankle inversion 35 25 c pain 39  Ankle eversion 23 15 c pain 15 c pain end range   (Blank rows = not tested)   LE MMT:   MMT Right 02/21/2021 Left 02/21/2021 L/R in pounds 03/08/2021  Hip flexion       Hip extension       Hip abduction       Hip adduction       Hip internal rotation       Hip external rotation       Knee flexion       Knee extension       Ankle dorsiflexion 5/5 3+/5   Ankle plantarflexion 5/5 2+/5   Ankle inversion 5/5 4/5 14.1/33.6 pounds  Ankle eversion 5/5 3+/5 7.4/31.0 pounds   (Blank rows = not tested)   LOWER EXTREMITY SPECIAL TESTS:  02/21/2021: none performed   FUNCTIONAL TESTS:  02/21/2021: none tested today.  Will plan for SLS, stair in future when WB allowed outside of boot.    GAIT: 02/21/2021: community distances c walker boot on Lt leg       TODAY'S TREATMENT:   03/08/2021 Therex:   Sitting green band resistance 2 sets X 20 repetitions inversion/eversion      Heel to toe raises 20X 3 seconds   Heel cord box 1 minute each gastrocnemius and soleus  Neuro re-ed Tandem stance: static eyes open, head turning and eyes closed 2X each 20 seconds and dynamic HT gait forward and backward 2 laps each eyes open, head turning, eyes closed    Vasopneumatic:  Lt ankle 10 mins 34 degrees moderate compression    03/06/2021:  Therex: Long sitting isometric painfree holds 10 sec x 6 in PF, DF, inversion and eversion.     Long sitting green band resistance x20 in PF, DF, inversion, eversion    Recumbent bike Lvl 2 6 mins   Manual: G2-g3 Lt ankle subtalar and talocrural mobs all  directions  Neurore-ed Tandem stance 1 min x 2 bilateral   Retro step on Lt leg 20x for WB control improvements  Vasopneumatic:  Lt ankle  10 mins 34 degrees moderate compression    02/21/2021:  Therex:       HEP instruction/performance c cues for techniques, handout provided.  Trial set performed of each for comprehension and symptom assessment. See below for listing of exercises included.   Vasopneumatic:  Lt ankle 10 mins 34 degrees moderate compression      PATIENT EDUCATION:  02/21/2021  Education details: HEP, POC Person educated: Patient Education method: Explanation, Demonstration, Verbal cues, and Handouts Education comprehension: verbalized understanding and returned demonstration     HOME EXERCISE PROGRAM:  Access Code: [email or web address] URL: [email or web address] Date: 03/08/2021 Prepared by: Vista Mink  Exercises Ankle Pumps in Elevation - 2 x daily - 7 x weekly - 1-2 sets - 10 reps Ankle Alphabet in Elevation - 2 x daily - 7 x weekly - 1-2 sets - 26 reps Supine Ankle Inversion Eversion AROM (Mirrored) - 1-2 x daily - 7 x weekly - 1-2 sets - 10 reps Isometric Ankle Eversion at Wall - 1-2 x daily - 7 x weekly - 1 sets - 10 reps - 5-10 hold Isometric Ankle Inversion at Wall (Mirrored) - 1-2 x daily - 7 x weekly - 1 sets - 10 reps - 5-10 hold Isometric Ankle Dorsiflexion and Plantarflexion - 1-2 x daily - 7 x weekly - 1 sets - 10 reps - 5-10 hold Ankle Inversion with Resistance - 1 x daily - 7 x weekly - 2-3 sets - 20 reps Ankle Eversion with Resistance - 1 x daily - 7 x weekly - 2-3 sets - 20 reps Heel Toe Raises with Counter Support - 2-3 x daily - 7 x weekly - 1 sets - 10 reps - 3 seconds hold    ASSESSMENT:   CLINICAL IMPRESSION: Aracely reports and demonstrates good early HEP compliance.  Her ankle strength is poor (see objective) and ankle strength/stability is a major focus with her physical therapy.  Continue strength, balance and proprioceptive work to  meet LTGs.     OBJECTIVE IMPAIRMENTS Abnormal gait, decreased balance, decreased coordination, decreased endurance, decreased mobility, difficulty walking, decreased ROM, decreased strength, hypomobility, increased edema, impaired flexibility, improper body mechanics, postural dysfunction, and pain.    ACTIVITY LIMITATIONS cleaning, community activity, occupation, yard work, and exercise routine .      REHAB POTENTIAL: Good   CLINICAL DECISION MAKING: Stable/uncomplicated   EVALUATION COMPLEXITY: Low     GOALS: Goals reviewed with patient? Yes   SHORT TERM GOALS:   STG Name Target Date Goal status  1 Patient will demonstrate independent use of home exercise program to maintain progress from in clinic treatments.  03/14/2021 MET Assessed 03/06/2021                                                          LONG TERM GOALS:    LTG Name Target Date Goal status  1 Patient will demonstrate/report pain at worst less than or equal to 2/10 to facilitate minimal limitation in daily activity secondary to pain symptoms.  05/02/2021 On going Assessed 03/06/2021  2 Patient will demonstrate independent use of home exercise program to facilitate ability to maintain/progress functional gains from skilled physical therapy services.  05/02/2021 On going Assessed 03/06/2021  3 Patient will demonstrate independent ambulation community distances > 300 ft to facilitate community integration at [facility].  05/02/2021 MET Assessed 03/06/2021  4 Patient will demonstrate FOTO outcome > or = 70% to indicated reduced disability due to condition. 05/02/2021 On going Assessed 03/06/2021  5 Patient will demonstrate B SLS > or = 30 seconds to facilitate stability in ambulation on even/uneven surfaces to reduce fall risk. 05/02/2021 On going Assessed 03/06/2021  6 Patient will demonstrate Lt ankle AROM DF > 10 degrees, inversion/eversion and PF equal to Rt to facilitate usual mobility and ambulation at PLOF.  05/02/2021 On  going Assessed 03/06/2021  7 Patient will demonstrate Lt ankle MMT 5/5 throughout.  05/02/2021 On going Assessed 03/06/2021    PLAN: PT FREQUENCY: 1-2x/week   PT DURATION: 10 weeks   PLANNED INTERVENTIONS: Therapeutic exercises, Therapeutic activity, Neuro Muscular re-education, Balance training, Gait training, Patient/Family education, Joint mobilization, Stair training, DME instructions, Dry Needling, Electrical stimulation, Cryotherapy, Moist heat, Taping, Ultrasound, Ionotophoresis 72m/ml Dexamethasone, and Manual therapy.  All included unless contraindicated   PLAN FOR NEXT SESSION: Review HEP & WB strengthening as tolerated (leg press, PF, step activity) progressive balance.   RFarley LyPT, MPT 03/08/21  3:25 PM

## 2021-03-13 ENCOUNTER — Encounter: Payer: Self-pay | Admitting: Physician Assistant

## 2021-03-13 ENCOUNTER — Encounter: Payer: No Typology Code available for payment source | Admitting: Physical Therapy

## 2021-03-13 ENCOUNTER — Ambulatory Visit (INDEPENDENT_AMBULATORY_CARE_PROVIDER_SITE_OTHER): Payer: No Typology Code available for payment source | Admitting: Physician Assistant

## 2021-03-13 DIAGNOSIS — S86312A Strain of muscle(s) and tendon(s) of peroneal muscle group at lower leg level, left leg, initial encounter: Secondary | ICD-10-CM | POA: Diagnosis not present

## 2021-03-13 NOTE — Progress Notes (Signed)
HPI: Kimberly [name] returns today for follow-up of her left ankle pain.  She did finish the Dosepak she wore the cam walker boot for 2 weeks.  She has also been to 3 visits to physical therapy.  She feels overall she is 75% better.  She is back in a regular shoe at this point time.  She has maximal pain lateral aspect the ankle about 10 AM this is her busiest time in the morning at work.  She is using Voltaren gel once daily and using naproxen in the morning. ? ?Review of systems see HPI otherwise negative ? ?Physical exam: General well-developed well-nourished female no acute distress. ?Left ankle: Full dorsiflexion plantarflexion.  Tenderness over the peroneal tendon just posterior to the lateral malleolus.  No tenderness distal peroneal tendons.  Nontender over the Achilles.  5 out of 5 strength with inversion eversion left foot against resistance.  Extreme inversion causes pain lateral ankle.  No rashes skin lesions ulcerations ecchymosis about the left ankle. ? ?Impression: Left ankle peroneal tendinitis ? ?Plan: Continue physical therapy.  We will have her apply Voltaren gel 4 times daily to the perineal region.  Have her avoid high impact activities for the next 2 months.  If her pain continues or becomes worse then would recommend MRI to rule out peroneal tendon tear.  Questions encouraged and answered at length.  She will let us know if she fails to progress to full recovery. ?

## 2021-03-13 NOTE — Therapy (Incomplete)
OUTPATIENT PHYSICAL THERAPY TREATMENT NOTE   Patient Name: Kimberly [name] MRN: [id number] DOB:1970-08-27, 51 y.o., female Today's Date: 03/13/2021  PCP: Donnamae Jude, MD REFERRING PROVIDER: Pete Pelt, PA-C      Past Medical History:  Diagnosis Date   Anorectal fistula    Anxiety    Arthritis    neck, hips   Chronic headaches    migraines   History of hypertension    since wt loss after gastric sleeve 2014--  no longer issue   Irritable bowel syndrome    PONV (postoperative nausea and vomiting)    slow to wake up   Wears glasses    Past Surgical History:  Procedure Laterality Date   [place]  2001   COLONOSCOPY  10-18-2010   w/ bx's   ESOPHAGOGASTRODUODENOSCOPY N/A 06/29/2012   Procedure: ESOPHAGOGASTRODUODENOSCOPY (EGD);  Surgeon: Madilyn Hook, DO;  Location: [facility];  Service: General;  Laterality: N/A;   EVALUATION UNDER ANESTHESIA WITH ANAL FISTULECTOMY N/A 04/14/2014   Procedure: ANAL EXAM UNDER ANESTHESIA WITH  FISTULOTOMY;  Surgeon: [name], MD;  Location: [facility];  Service: General;  Laterality: N/A;   LAPAROSCOPIC BILATERAL SALPINGECTOMY N/A 06/29/2012   Procedure: LAPAROSCOPIC BILATERAL SALPINGECTOMY;  Surgeon: Donnamae Jude, MD;  Location: [facility];  Service: Gynecology;  Laterality: N/A;   LAPAROSCOPIC GASTRIC SLEEVE RESECTION N/A 06/29/2012   Procedure: LAPAROSCOPIC GASTRIC SLEEVE RESECTION;  Surgeon: Madilyn Hook, DO;  Location: [facility];  Service: General;  Laterality: N/A;  Laparoscopic Sleeve Gastrectomy with EGD   REDUCTION MAMMAPLASTY Bilateral    VAGINAL HYSTERECTOMY N/A 01/13/2017   Procedure: HYSTERECTOMY VAGINAL;  Surgeon: Donnamae Jude, MD;  Location: [facility];  Service: Gynecology;  Laterality: N/A;   Patient Active Problem List   Diagnosis Date Noted   Muscle spasm 11/13/2017   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   Migraine without aura 09/13/2010   ANA positive  09/13/2010   Polyarthralgia 09/13/2010   Essential hypertension 01/11/2009   DE QUERVAIN'S TENOSYNOVITIS 01/26/2008   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    REFERRING DIAG: M25.572 (ICD-10-CM) - Pain in left ankle and joints of left foot S86.312A (ICD-10-CM) - Strain of peroneal tendon of left foot, initial encounter  REFERRING PROVIDER: Pete Pelt, PA-C  ONSET DATE: 01/24/2021  THERAPY DIAG:  No diagnosis found.  PERTINENT HISTORY: History of various ankle sprains in past. HTN, IBS, histor of migraines  PRECAUTIONS: WEIGHT BEARING RESTRICTIONS  02/21/2021: Yes  WB in CAM walker boot 2 weeks from 02/13/2021  SUBJECTIVE:  *** Kimberly [name] reports good HEP compliance.  She is wearing her ankle brace most of the day when WB.  PAIN:  Are you having pain? Yes NPRS scale: ***/10 (range of 2-8/10 this week) Pain location: ankle Pain orientation: Left, lateral PAIN TYPE: acute Pain description: achy, burning, numbness Aggravating factors: walking, standing Relieving factors: ice   OBJECTIVE:        PATIENT SURVEYS:  02/21/2021: FOTO initial  54  predicted  70                       SENSATION: 02/21/2021: Light touch: Appears intact                 Localized edema Lt ankle 02/21/2021: Figure 8 Lt: 19.5 inches   Rt: 19.25 inches     PALPATION: Mild tenderness path of peroneals Lt ankle around malleolus  LE AROM/PROM:                     02/21/2021: measured in seated 90 knee flexion AROM Right 02/21/2021 Left 02/21/2021 Left 03/06/2021 AROM measured in seated 90 knee flexion  Hip flexion       Hip extension       Hip abduction       Hip adduction       Hip internal rotation       Hip external rotation       Knee flexion       Knee extension       Ankle dorsiflexion 20 -3 c pain 12  Ankle plantarflexion 58 38 c pain 50  Ankle inversion 35 25 c pain 39  Ankle eversion 23 15 c pain 15 c pain end range   (Blank rows = not tested)   LE MMT:    MMT Right 02/21/2021 Left 02/21/2021 L/R in pounds 03/08/2021  Hip flexion       Hip extension       Hip abduction       Hip adduction       Hip internal rotation       Hip external rotation       Knee flexion       Knee extension       Ankle dorsiflexion 5/5 3+/5   Ankle plantarflexion 5/5 2+/5   Ankle inversion 5/5 4/5 14.1/33.6 pounds  Ankle eversion 5/5 3+/5 7.4/31.0 pounds   (Blank rows = not tested)   LOWER EXTREMITY SPECIAL TESTS:  02/21/2021: none performed   FUNCTIONAL TESTS:  02/21/2021: none tested today.  Will plan for SLS, stair in future when WB allowed outside of boot.    GAIT: 02/21/2021: community distances c walker boot on Lt leg       TODAY'S TREATMENT:  03/13/21 ***  03/08/2021 Therex:   Sitting green band resistance 2 sets X 20 repetitions inversion/eversion      Heel to toe raises 20X 3 seconds   Heel cord box 1 minute each gastrocnemius and soleus  Neuro re-ed Tandem stance: static eyes open, head turning and eyes closed 2X each 20 seconds and dynamic HT gait forward and backward 2 laps each eyes open, head turning, eyes closed    Vasopneumatic:  Lt ankle 10 mins 34 degrees moderate compression    03/06/2021:  Therex: Long sitting isometric painfree holds 10 sec x 6 in PF, DF, inversion and eversion.     Long sitting green band resistance x20 in PF, DF, inversion, eversion    Recumbent bike Lvl 2 6 mins   Manual: G2-g3 Lt ankle subtalar and talocrural mobs all directions  Neurore-ed Tandem stance 1 min x 2 bilateral   Retro step on Lt leg 20x for WB control improvements  Vasopneumatic:  Lt ankle 10 mins 34 degrees moderate compression   PATIENT EDUCATION:  02/21/2021  Education details: HEP, POC Person educated: Patient Education method: Consulting civil engineer, Demonstration, Verbal cues, and Handouts Education comprehension: verbalized understanding and returned demonstration     HOME EXERCISE PROGRAM:  Access Code: [email or web address] URL:  [email or web address] Date: 03/08/2021 Prepared by: Vista Mink  Exercises Ankle Pumps in Elevation - 2 x daily - 7 x weekly - 1-2 sets - 10 reps Ankle Alphabet in Elevation - 2 x daily - 7 x weekly - 1-2 sets - 26 reps Supine Ankle Inversion Eversion AROM (Mirrored) - 1-2 x daily - 7 x weekly - 1-2  sets - 10 reps Isometric Ankle Eversion at Wall - 1-2 x daily - 7 x weekly - 1 sets - 10 reps - 5-10 hold Isometric Ankle Inversion at Wall (Mirrored) - 1-2 x daily - 7 x weekly - 1 sets - 10 reps - 5-10 hold Isometric Ankle Dorsiflexion and Plantarflexion - 1-2 x daily - 7 x weekly - 1 sets - 10 reps - 5-10 hold Ankle Inversion with Resistance - 1 x daily - 7 x weekly - 2-3 sets - 20 reps Ankle Eversion with Resistance - 1 x daily - 7 x weekly - 2-3 sets - 20 reps Heel Toe Raises with Counter Support - 2-3 x daily - 7 x weekly - 1 sets - 10 reps - 3 seconds hold    ASSESSMENT:   CLINICAL IMPRESSION: *** [place] reports and demonstrates good early HEP compliance.  Her ankle strength is poor (see objective) and ankle strength/stability is a major focus with her physical therapy.  Continue strength, balance and proprioceptive work to meet LTGs.     OBJECTIVE IMPAIRMENTS Abnormal gait, decreased balance, decreased coordination, decreased endurance, decreased mobility, difficulty walking, decreased ROM, decreased strength, hypomobility, increased edema, impaired flexibility, improper body mechanics, postural dysfunction, and pain.    ACTIVITY LIMITATIONS cleaning, community activity, occupation, yard work, and exercise routine .      REHAB POTENTIAL: Good   CLINICAL DECISION MAKING: Stable/uncomplicated   EVALUATION COMPLEXITY: Low     GOALS: Goals reviewed with patient? Yes   SHORT TERM GOALS:   STG Name Target Date Goal status  1 Patient will demonstrate independent use of home exercise program to maintain progress from in clinic treatments.  03/14/2021 MET Assessed  03/06/2021                                                          LONG TERM GOALS:    LTG Name Target Date Goal status  1 Patient will demonstrate/report pain at worst less than or equal to 2/10 to facilitate minimal limitation in daily activity secondary to pain symptoms.  05/02/2021 On going Assessed 03/06/2021  2 Patient will demonstrate independent use of home exercise program to facilitate ability to maintain/progress functional gains from skilled physical therapy services.  05/02/2021 On going Assessed 03/06/2021  3 Patient will demonstrate independent ambulation community distances > 300 ft to facilitate community integration at PLOF.  05/02/2021 MET Assessed 03/06/2021  4 Patient will demonstrate FOTO outcome > or = 70% to indicated reduced disability due to condition. 05/02/2021 On going Assessed 03/06/2021  5 Patient will demonstrate B SLS > or = 30 seconds to facilitate stability in ambulation on even/uneven surfaces to reduce fall risk. 05/02/2021 On going Assessed 03/06/2021  6 Patient will demonstrate Lt ankle AROM DF > 10 degrees, inversion/eversion and PF equal to Rt to facilitate usual mobility and ambulation at PLOF.  05/02/2021 On going Assessed 03/06/2021  7 Patient will demonstrate Lt ankle MMT 5/5 throughout.  05/02/2021 On going Assessed 03/06/2021    PLAN: PT FREQUENCY: 1-2x/week   PT DURATION: 10 weeks   PLANNED INTERVENTIONS: Therapeutic exercises, Therapeutic activity, Neuro Muscular re-education, Balance training, Gait training, Patient/Family education, Joint mobilization, Stair training, DME instructions, Dry Needling, Electrical stimulation, Cryotherapy, Moist heat, Taping, Ultrasound, Ionotophoresis 39m/ml Dexamethasone, and Manual therapy.  All included unless contraindicated  PLAN FOR NEXT SESSION: *** Review HEP & WB strengthening as tolerated (leg press, PF, step activity) progressive balance.   Laureen Abrahams, PT, DPT 03/13/21 7:45 AM

## 2021-03-14 ENCOUNTER — Encounter: Payer: Self-pay | Admitting: Rehabilitative and Restorative Service Providers"

## 2021-03-14 ENCOUNTER — Other Ambulatory Visit: Payer: Self-pay

## 2021-03-14 ENCOUNTER — Ambulatory Visit (INDEPENDENT_AMBULATORY_CARE_PROVIDER_SITE_OTHER): Payer: No Typology Code available for payment source | Admitting: Rehabilitative and Restorative Service Providers"

## 2021-03-14 DIAGNOSIS — R262 Difficulty in walking, not elsewhere classified: Secondary | ICD-10-CM

## 2021-03-14 DIAGNOSIS — M25572 Pain in left ankle and joints of left foot: Secondary | ICD-10-CM

## 2021-03-14 DIAGNOSIS — R600 Localized edema: Secondary | ICD-10-CM

## 2021-03-14 DIAGNOSIS — M6281 Muscle weakness (generalized): Secondary | ICD-10-CM

## 2021-03-14 NOTE — Therapy (Addendum)
OUTPATIENT PHYSICAL THERAPY TREATMENT NOTE [place]    Patient Name: Kimberly [name] MRN: [id number] DOB:1970-11-11, 51 y.o., female Today's Date: 03/14/2021  PCP: Donnamae Jude, MD REFERRING PROVIDER: Pete Pelt, PA-C   PT End of Session - 03/14/21 1538     Visit Number 4    Number of Visits 20    Date for PT Re-Evaluation 05/02/21    Authorization Type [employer] all saves no copay    PT Start Time 1505    PT Stop Time 1554    PT Time Calculation (min) 49 min    Activity Tolerance Patient tolerated treatment well    Behavior During Therapy WFL for tasks assessed/performed               Past Medical History:  Diagnosis Date   Anorectal fistula    Anxiety    Arthritis    neck, hips   Chronic headaches    migraines   History of hypertension    since wt loss after gastric sleeve 2014--  no longer issue   Irritable bowel syndrome    PONV (postoperative nausea and vomiting)    slow to wake up   Wears glasses    Past Surgical History:  Procedure Laterality Date   [place] SECTION  2001   COLONOSCOPY  10-18-2010   w/ bx's   ESOPHAGOGASTRODUODENOSCOPY N/A 06/29/2012   Procedure: ESOPHAGOGASTRODUODENOSCOPY (EGD);  Surgeon: Madilyn Hook, DO;  Location: [facility];  Service: General;  Laterality: N/A;   EVALUATION UNDER ANESTHESIA WITH ANAL FISTULECTOMY N/A 04/14/2014   Procedure: ANAL EXAM UNDER ANESTHESIA WITH  FISTULOTOMY;  Surgeon: [name], MD;  Location: [facility];  Service: General;  Laterality: N/A;   LAPAROSCOPIC BILATERAL SALPINGECTOMY N/A 06/29/2012   Procedure: LAPAROSCOPIC BILATERAL SALPINGECTOMY;  Surgeon: Donnamae Jude, MD;  Location: [facility];  Service: Gynecology;  Laterality: N/A;   LAPAROSCOPIC GASTRIC SLEEVE RESECTION N/A 06/29/2012   Procedure: LAPAROSCOPIC GASTRIC SLEEVE RESECTION;  Surgeon: Madilyn Hook, DO;  Location: [facility];  Service: General;  Laterality: N/A;  Laparoscopic Sleeve Gastrectomy with EGD    REDUCTION MAMMAPLASTY Bilateral    VAGINAL HYSTERECTOMY N/A 01/13/2017   Procedure: HYSTERECTOMY VAGINAL;  Surgeon: Donnamae Jude, MD;  Location: [facility];  Service: Gynecology;  Laterality: N/A;   Patient Active Problem List   Diagnosis Date Noted   Muscle spasm 11/13/2017   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   Migraine without aura 09/13/2010   ANA positive 09/13/2010   Polyarthralgia 09/13/2010   Essential hypertension 01/11/2009   DE QUERVAIN'S TENOSYNOVITIS 01/26/2008   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    REFERRING DIAG: M25.572 (ICD-10-CM) - Pain in left ankle and joints of left foot S86.312A (ICD-10-CM) - Strain of peroneal tendon of left foot, initial encounter  REFERRING PROVIDER: Pete Pelt, PA-C  ONSET DATE: 01/24/2021  THERAPY DIAG:  Pain in left ankle and joints of left foot  Muscle weakness (generalized)  Difficulty in walking, not elsewhere classified  Localized edema  PERTINENT HISTORY: History of various ankle sprains in past. HTN, IBS, histor of migraines  PRECAUTIONS: WEIGHT BEARING RESTRICTIONS  02/21/2021: Yes  WB in CAM walker boot 2 weeks from 02/13/2021  SUBJECTIVE: Pt indicated 2/10 pain c gel and 4/10 pain without gel.   PAIN:  Are you having pain? Yes NPRS scale: 2/10 Pain location: ankle Pain orientation: Left, lateral PAIN TYPE: acute Pain description: achy, burning, numbness Aggravating factors:  walking, standing Relieving factors: ice   OBJECTIVE:        PATIENT SURVEYS:  02/21/2021: FOTO initial  54  predicted  70            SENSATION: 02/21/2021: Light touch: Appears intact    Localized edema Lt ankle 02/21/2021: Figure 8 Lt: 19.5 inches   Rt: 19.25 inches     PALPATION: Mild tenderness path of peroneals Lt ankle around malleolus   LE AROM/PROM:                     02/21/2021: measured in seated 90 knee flexion AROM Right 02/21/2021 Left 02/21/2021 Left 03/06/2021 AROM  measured in seated 90 knee flexion Left 03/14/2021  Hip flexion        Hip extension        Hip abduction        Hip adduction        Hip internal rotation        Hip external rotation        Knee flexion        Knee extension        Ankle dorsiflexion 20 -3 c pain 12 14  Ankle plantarflexion 58 38 c pain 50   Ankle inversion 35 25 c pain 39   Ankle eversion 23 15 c pain 15 c pain end range    (Blank rows = not tested)   LE MMT:   MMT Right 02/21/2021 Left 02/21/2021 Lt/Rt  03/08/2021 Left 03/14/2021   Hip flexion         Hip extension         Hip abduction         Hip adduction         Hip internal rotation         Hip external rotation         Knee flexion         Knee extension         Ankle dorsiflexion 5/5 3+/5  5/5   Ankle plantarflexion 5/5 2+/5     Ankle inversion 5/5 4/5 Lt 14.1/ Rt 33.6 pounds    Ankle eversion 5/5 3+/5 Lt 7.4/ Rt 31.0 pounds     (Blank rows = not tested)   LOWER EXTREMITY SPECIAL TESTS:  02/21/2021: none performed   FUNCTIONAL TESTS:  02/21/2021: none tested today.  Will plan for SLS, stair in future when WB allowed outside of boot.    GAIT: 03/14/2021: independent ambulation    TODAY'S TREATMENT: 03/14/2021:  Therex: Long sitting blue band resistance 2x15  in DF, inversion, eversion    Recumbent bike Lvl 2 8 mins    Double leg PF c 75 % Lt leg loading eccentric lowering 2 x 10    Incline gastroc runner stretch Lt leg posterior 1 min x 2    Incline soleus stretch 1 min x 1   Neurore-ed Fitter board fwd/back 20x each way SLS c anterior, anterior/lateral, anterior/medial cone tap with opposite leg x 8 bilateral  Vasopneumatic:  Lt ankle 10 mins 34 degrees moderate compression    03/08/2021 Therex:   Sitting green band resistance 2 sets X 20 repetitions inversion/eversion      Heel to toe raises 20X 3 seconds   Heel cord box 1 minute each gastrocnemius and soleus  Neuro re-ed Tandem stance: static eyes open, head turning and eyes closed 2X  each 20 seconds and dynamic HT gait forward and backward 2 laps  each eyes open, head turning, eyes closed    Vasopneumatic:  Lt ankle 10 mins 34 degrees moderate compression    03/06/2021:  Therex: Long sitting isometric painfree holds 10 sec x 6 in PF, DF, inversion and eversion.     Long sitting green band resistance x20 in PF, DF, inversion, eversion    Recumbent bike Lvl 2 6 mins   Manual: G2-g3 Lt ankle subtalar and talocrural mobs all directions  Neurore-ed Tandem stance 1 min x 2 bilateral   Retro step on Lt leg 20x for WB control improvements  Vasopneumatic:  Lt ankle 10 mins 34 degrees moderate compression    02/21/2021:  Therex:       HEP instruction/performance c cues for techniques, handout provided.  Trial set performed of each for comprehension and symptom assessment. See below for listing of exercises included.   Vasopneumatic:  Lt ankle 10 mins 34 degrees moderate compression      PATIENT EDUCATION:  02/21/2021  Education details: HEP, POC Person educated: Patient Education method: Explanation, Demonstration, Verbal cues, and Handouts Education comprehension: verbalized understanding and returned demonstration     HOME EXERCISE PROGRAM:  Access Code: [email or web address] URL: [email or web address] Date: 03/08/2021 Prepared by: Vista Mink  Exercises Ankle Pumps in Elevation - 2 x daily - 7 x weekly - 1-2 sets - 10 reps Ankle Alphabet in Elevation - 2 x daily - 7 x weekly - 1-2 sets - 26 reps Supine Ankle Inversion Eversion AROM (Mirrored) - 1-2 x daily - 7 x weekly - 1-2 sets - 10 reps Isometric Ankle Eversion at Wall - 1-2 x daily - 7 x weekly - 1 sets - 10 reps - 5-10 hold Isometric Ankle Inversion at Wall (Mirrored) - 1-2 x daily - 7 x weekly - 1 sets - 10 reps - 5-10 hold Isometric Ankle Dorsiflexion and Plantarflexion - 1-2 x daily - 7 x weekly - 1 sets - 10 reps - 5-10 hold Ankle Inversion with Resistance - 1 x daily - 7 x weekly - 2-3 sets - 20  reps Ankle Eversion with Resistance - 1 x daily - 7 x weekly - 2-3 sets - 20 reps Heel Toe Raises with Counter Support - 2-3 x daily - 7 x weekly - 1 sets - 10 reps - 3 seconds hold    ASSESSMENT:   CLINICAL IMPRESSION: Independent ambulation improving in quality.  Eversion active movement still most painful as noted in clinic today.  Continued progressive strengthening and loading program in WB and NWB activity today to address impairment.      OBJECTIVE IMPAIRMENTS Abnormal gait, decreased balance, decreased coordination, decreased endurance, decreased mobility, difficulty walking, decreased ROM, decreased strength, hypomobility, increased edema, impaired flexibility, improper body mechanics, postural dysfunction, and pain.    ACTIVITY LIMITATIONS cleaning, community activity, occupation, yard work, and exercise routine .      REHAB POTENTIAL: Good   CLINICAL DECISION MAKING: Stable/uncomplicated   EVALUATION COMPLEXITY: Low     GOALS: Goals reviewed with patient? Yes   SHORT TERM GOALS:   STG Name Target Date Goal status  1 Patient will demonstrate independent use of home exercise program to maintain progress from in clinic treatments.  03/14/2021 MET Assessed 03/06/2021  LONG TERM GOALS:    LTG Name Target Date Goal status  1 Patient will demonstrate/report pain at worst less than or equal to 2/10 to facilitate minimal limitation in daily activity secondary to pain symptoms.  05/02/2021 On going Assessed 03/06/2021  2 Patient will demonstrate independent use of home exercise program to facilitate ability to maintain/progress functional gains from skilled physical therapy services.  05/02/2021 On going Assessed 03/06/2021  3 Patient will demonstrate independent ambulation community distances > 300 ft to facilitate community integration at PLOF.  05/02/2021 MET Assessed 03/06/2021  4 Patient will demonstrate FOTO outcome > or  = 70% to indicated reduced disability due to condition. 05/02/2021 On going Assessed 03/06/2021  5 Patient will demonstrate B SLS > or = 30 seconds to facilitate stability in ambulation on even/uneven surfaces to reduce fall risk. 05/02/2021 On going Assessed 03/06/2021  6 Patient will demonstrate Lt ankle AROM DF > 10 degrees, inversion/eversion and PF equal to Rt to facilitate usual mobility and ambulation at PLOF.  05/02/2021 On going Assessed 03/06/2021  7 Patient will demonstrate Lt ankle MMT 5/5 throughout.  05/02/2021 On going Assessed 03/06/2021    PLAN: PT FREQUENCY: 1-2x/week   PT DURATION: 10 weeks   PLANNED INTERVENTIONS: Therapeutic exercises, Therapeutic activity, Neuro Muscular re-education, Balance training, Gait training, Patient/Family education, Joint mobilization, Stair training, DME instructions, Dry Needling, Electrical stimulation, Cryotherapy, Moist heat, Taping, Ultrasound, Ionotophoresis 67m/ml Dexamethasone, and Manual therapy.  All included unless contraindicated   PLAN FOR NEXT SESSION:  Compliant surface balance, Eversion loading for strengthening to tolerance.   MScot Jun PT, DPT, OCS, ATC 03/14/21  3:47 PM   PHYSICAL THERAPY DISCHARGE SUMMARY  Visits from Start of Care: 4  Current functional level related to goals / functional outcomes: See note   Remaining deficits: See note   Education / Equipment: HEP   Patient agrees to discharge. Patient goals were partially met. Patient is being discharged due to not returning since the last visit.  MScot Jun PT, DPT, OCS, ATC 03/18/21  2:08 PM

## 2021-03-19 ENCOUNTER — Encounter: Payer: No Typology Code available for payment source | Admitting: Physical Therapy

## 2021-03-19 ENCOUNTER — Other Ambulatory Visit: Payer: Self-pay | Admitting: Physician Assistant

## 2021-03-21 ENCOUNTER — Encounter: Payer: No Typology Code available for payment source | Admitting: Physical Therapy

## 2021-08-13 ENCOUNTER — Other Ambulatory Visit: Payer: Self-pay | Admitting: Physician Assistant

## 2021-08-14 ENCOUNTER — Other Ambulatory Visit: Payer: Self-pay | Admitting: Family Medicine

## 2021-08-14 DIAGNOSIS — I10 Essential (primary) hypertension: Secondary | ICD-10-CM

## 2021-10-30 ENCOUNTER — Ambulatory Visit: Payer: 59

## 2021-10-30 ENCOUNTER — Ambulatory Visit (INDEPENDENT_AMBULATORY_CARE_PROVIDER_SITE_OTHER): Payer: 59

## 2021-10-30 ENCOUNTER — Ambulatory Visit
Admission: EM | Admit: 2021-10-30 | Discharge: 2021-10-30 | Disposition: A | Discharge status: Home / Self Care | Payer: 59 | Attending: Emergency Medicine | Admitting: Emergency Medicine

## 2021-10-30 DIAGNOSIS — S60051A Contusion of right little finger without damage to nail, initial encounter: Secondary | ICD-10-CM | POA: Diagnosis not present

## 2021-10-30 DIAGNOSIS — S67197A Crushing injury of left little finger, initial encounter: Secondary | ICD-10-CM

## 2021-10-30 NOTE — Discharge Instructions (Addendum)
X-rays did not show any broken bones or dislocated bones.  I believe you have bruised the soft tissues as result of the crush injury from the weight.  Buddy tape your right fourth and fifth finger together to provide some support and comfort to your right fifth finger.  You can apply moist heat to your finger for 20 minutes at a time 2-3 times a day to help improve blood flow and aid in healing.  Use over-the-counter Tylenol and ibuprofen as needed for pain and inflammation.  Follow the home physical therapy exercises given discharge.  Return for reevaluation, or see your primary care provider, for any continued or worsening symptoms.

## 2021-10-30 NOTE — ED Provider Notes (Signed)
[facility]    CSN: [id number] Arrival date & time: 10/30/21  1220      History   Chief Complaint Chief Complaint  Patient presents with   Finger Injury    Left hand pinky finger    HPI Kimberly [name] is a 51 y.o. female.   HPI  51 year old female here for evaluation of left pinky finger injury.  Patient reports that she was in the gym and a 20 pound weight rolled over and fell on her left fifth finger at the MCP joint.  She states that she had significant pain right away and applied ice and a splint to her finger.  She describes the pain as being a throbbing and burning but she denies any tingling or numbness.  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   Migraine without aura 09/13/2010   ANA positive 09/13/2010   Polyarthralgia 09/13/2010   Essential hypertension 01/11/2009   DE QUERVAIN'S TENOSYNOVITIS 01/26/2008   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    Past Surgical History:  Procedure Laterality Date   [place]  2001   COLONOSCOPY  10-18-2010   w/ bx's   ESOPHAGOGASTRODUODENOSCOPY N/A 06/29/2012   Procedure: ESOPHAGOGASTRODUODENOSCOPY (EGD);  Surgeon: Madilyn Hook, DO;  Location: [facility];  Service: General;  Laterality: N/A;   EVALUATION UNDER ANESTHESIA WITH ANAL FISTULECTOMY N/A 04/14/2014   Procedure: ANAL EXAM UNDER ANESTHESIA WITH  FISTULOTOMY;  Surgeon: [name], MD;  Location: [facility];  Service: General;  Laterality: N/A;   LAPAROSCOPIC  BILATERAL SALPINGECTOMY N/A 06/29/2012   Procedure: LAPAROSCOPIC BILATERAL SALPINGECTOMY;  Surgeon: Donnamae Jude, MD;  Location: [facility];  Service: Gynecology;  Laterality: N/A;   LAPAROSCOPIC GASTRIC SLEEVE RESECTION N/A 06/29/2012   Procedure: LAPAROSCOPIC GASTRIC SLEEVE RESECTION;  Surgeon: Madilyn Hook, DO;  Location: [facility];  Service: General;  Laterality: N/A;  Laparoscopic Sleeve Gastrectomy with EGD   REDUCTION MAMMAPLASTY Bilateral    VAGINAL HYSTERECTOMY N/A 01/13/2017   Procedure: HYSTERECTOMY VAGINAL;  Surgeon: Donnamae Jude, MD;  Location: [facility];  Service: Gynecology;  Laterality: N/A;    OB History     Gravida  1   Para  1   Term      Preterm      AB      Living  1      SAB      IAB      Ectopic      Multiple      Live Births               Home Medications    Prior to Admission medications   Medication Sig Start Date End Date Taking? Authorizing Provider  ALPRAZolam Duanne Moron) 0.5 MG tablet TAKE 1 TABLET BY MOUTH DAILY AS NEEDED FOR ANXIETY 02/17/20   Jaclyn Prime, Collene Leyden, PA-C  busPIRone (BUSPAR) 10 MG tablet TAKE 1 TABLET(10 MG)  BY MOUTH THREE TIMES DAILY 08/08/20   Jaclyn Prime, Collene Leyden, PA-C  cyclobenzaprine (FLEXERIL) 10 MG tablet Take 1 tablet (10 mg total) by mouth 3 (three) times daily as needed for muscle spasms. 11/26/18   Jaclyn Prime, Collene Leyden, PA-C  ibuprofen (ADVIL,MOTRIN) 200 MG tablet Take 800 mg by mouth daily as needed for headache or moderate pain.    [provider]  naproxen (NAPROSYN) 500 MG tablet TAKE 1 TABLET(500 MG) BY MOUTH TWICE DAILY WITH A MEAL 06/15/20   Jaclyn Prime, Collene Leyden, PA-C  olmesartan-hydrochlorothiazide (BENICAR HCT) 20-12.5 MG tablet Take 1 tablet by mouth daily. 02/17/20   Donnamae Jude, MD  ondansetron (ZOFRAN) 8 MG tablet Take 1 tablet (8 mg total) by mouth every 8 (eight) hours as needed for nausea or vomiting. 11/26/18   Jaclyn Prime, Collene Leyden, PA-C  phenazopyridine (PYRIDIUM) 100 MG tablet Take 1 tablet  (100 mg total) by mouth 3 (three) times daily as needed for pain. 11/15/19   Donnamae Jude, MD  rizatriptan (MAXALT-MLT) 10 MG disintegrating tablet Take 1 tablet (10 mg total) by mouth as needed for migraine. May repeat in 2 hours if needed 11/26/18   Jaclyn Prime, Collene Leyden, PA-C  tiZANidine (ZANAFLEX) 4 MG tablet  11/11/18   [provider]  UBRELVY 100 MG TABS TAKE 1 TABLET BY MOUTH AS NEEDED 02/17/20   Jaclyn Prime, Collene Leyden, PA-C  valACYclovir (VALTREX) 1000 MG tablet TAKE 1 TABLET BY MOUTH ONCE DAILY 07/03/17   Jaclyn Prime, Collene Leyden, PA-C    Family History Family History  Problem Relation Age of Onset   Colitis Father    Crohn's disease Father    Diabetes Father    Diverticulitis Father    Diabetes Mother    Uterine cancer Mother 36   Cancer Mother        endometrial   Ovarian cancer Paternal Grandmother    Cancer Paternal Grandmother 62       ovarian   Diabetes Sister    Uterine cancer Sister 43   Cancer Sister        endometrial   Irritable bowel syndrome Sister    Irritable bowel syndrome Brother    Colon cancer Paternal Uncle    Cancer Maternal Uncle        lung    Social History Social History   Tobacco Use   Smoking status: Never   Smokeless tobacco: Never  Vaping Use   Vaping Use: Never used  Substance Use Topics   Alcohol use: Yes    Comment: occasional   Drug use: No     Allergies   Adhesive [tape]   Review of Systems Review of Systems  Musculoskeletal:  Positive for arthralgias. Negative for joint swelling.  Skin:  Positive for color change.  Neurological:  Negative for weakness and numbness.     Physical Exam Triage Vital Signs ED Triage Vitals  Enc Vitals Group     BP 10/30/21 1244 136/84     Pulse Rate 10/30/21 1244 73     Resp --      Temp 10/30/21 1244 98.3 F (36.8 C)     Temp Source 10/30/21 1244 Oral     SpO2 10/30/21 1244 100 %     Weight 10/30/21 1242 168 lb (76.2 kg)     Height 10/30/21 1242 5' (1.524 m)     Head  Circumference --      Peak Flow --  Pain Score 10/30/21 1242 6     Pain Loc --      Pain Edu? --      Excl. in [place]? --    No data found.  Updated Vital Signs BP 136/84 (BP Location: Right Arm)   Pulse 73   Temp 98.3 F (36.8 C) (Oral)   Ht 5' (1.524 m)   Wt 168 lb (76.2 kg)   LMP 01/09/2017 (Exact Date)   SpO2 100%   BMI 32.81 kg/m   Visual Acuity Right Eye Distance:   Left Eye Distance:   Bilateral Distance:    Right Eye Near:   Left Eye Near:    Bilateral Near:     Physical Exam Vitals and nursing note reviewed.  Constitutional:      Appearance: Normal appearance. She is not ill-appearing.  Musculoskeletal:        General: Tenderness and signs of injury present. No swelling or deformity.  Skin:    General: Skin is warm and dry.     Capillary Refill: Capillary refill takes less than 2 seconds.     Findings: Bruising present.  Neurological:     General: No focal deficit present.     Mental Status: She is alert and oriented to person, place, and time.     Sensory: No sensory deficit.  Psychiatric:        Mood and Affect: Mood normal.        Behavior: Behavior normal.        Thought Content: Thought content normal.        Judgment: Judgment normal.      UC Treatments / Results  Labs (all labs ordered are listed, but only abnormal results are displayed) Labs Reviewed - No data to display  EKG   Radiology DG Finger Little Left  Result Date: 10/30/2021 CLINICAL DATA:  Crush injury with pain and swelling. EXAM: LEFT LITTLE FINGER 2+V COMPARISON:  None Available. FINDINGS: There is no evidence of fracture or dislocation. There is no evidence of arthropathy or other focal bone abnormality. Soft tissues are unremarkable. IMPRESSION: Negative. Electronically Signed   By: Nelson Chimes M.D.   On: 10/30/2021 13:18    Procedures Procedures (including critical care time)  Medications Ordered in UC Medications - No data to display  Initial Impression /  Assessment and Plan / UC Course  I have reviewed the triage vital signs and the nursing notes.  Pertinent labs & imaging results that were available during my care of the patient were reviewed by me and considered in my medical decision making (see chart for details).   Patient is a very pleasant, nontoxic-appearing 51 year old female here for evaluation of left fifth finger injury that occurred 2 days ago.  On exam patient's left finger is in normal anatomical alignment but she does have tenderness with palpation of the DIP and PIP joints.  Also mild tenderness with palpation of the MCP joint.  The phalanxes between the joint are not tender to palpation.  She does have some ecchymosis to the medial aspect of the PIP joint but no other ecchymosis or erythema noted.  When attempting to perform active range of motion patient is unable to completely close her finger secondary to pain.  Her cap refill is less than 2 seconds.  I will obtain radiograph to look for bony abnormality.  Radiology impression of right fifth finger x-rays is negative for fracture or dislocation.  I will discharge patient home with a diagnosis of contusion  of her right fifth finger and buddy tape it to her right fourth finger.  I will have her apply moist heat to her hand for 20 minutes at a time to help improve blood flow and aid in wound healing.  Over-the-counter Tylenol and ibuprofen as needed for discomfort.   Final Clinical Impressions(s) / UC Diagnoses   Final diagnoses:  Contusion of right little finger without damage to nail, initial encounter     Discharge Instructions      X-rays did not show any broken bones or dislocated bones.  I believe you have bruised the soft tissues as result of the crush injury from the weight.  Buddy tape your right fourth and fifth finger together to provide some support and comfort to your right fifth finger.  You can apply moist heat to your finger for 20 minutes at a time 2-3  times a day to help improve blood flow and aid in healing.  Use over-the-counter Tylenol and ibuprofen as needed for pain and inflammation.  Follow the home physical therapy exercises given discharge.  Return for reevaluation, or see your primary care provider, for any continued or worsening symptoms.     ED Prescriptions   None    PDMP not reviewed this encounter.   Margarette Canada, NP 10/30/21 1334

## 2021-10-30 NOTE — ED Triage Notes (Signed)
Patient  c.o dropping a 20 pound weight on her left hand pinky finger.

## 2021-12-18 ENCOUNTER — Ambulatory Visit (INDEPENDENT_AMBULATORY_CARE_PROVIDER_SITE_OTHER): Payer: 59 | Admitting: Family Medicine

## 2021-12-18 ENCOUNTER — Encounter: Payer: Self-pay | Admitting: Family Medicine

## 2021-12-18 VITALS — BP 127/85 | HR 79 | Ht 60.0 in | Wt 179.0 lb

## 2021-12-18 DIAGNOSIS — Z1231 Encounter for screening mammogram for malignant neoplasm of breast: Secondary | ICD-10-CM

## 2021-12-18 DIAGNOSIS — Z1211 Encounter for screening for malignant neoplasm of colon: Secondary | ICD-10-CM | POA: Diagnosis not present

## 2021-12-18 DIAGNOSIS — Z01419 Encounter for gynecological examination (general) (routine) without abnormal findings: Secondary | ICD-10-CM

## 2021-12-18 NOTE — Progress Notes (Signed)
Subjective:     Kimberly [name] is a 51 y.o. female and is here for a comprehensive physical exam. The patient reports no problems. She is s/p TVH for bleeding with adenomyosis and normal pap history.  The following portions of the patient's history were reviewed and updated as appropriate: allergies, current medications, past family history, past medical history, past social history, past surgical history, and problem list.  Review of Systems Pertinent items noted in HPI and remainder of comprehensive ROS otherwise negative.   Objective:    BP 127/85   Pulse 79   Ht 5' (1.524 m)   Wt 179 lb (81.2 kg)   LMP 01/09/2017 (Exact Date)   BMI 34.96 kg/m  General appearance: alert, cooperative, and appears stated age Head: Normocephalic, without obvious abnormality, atraumatic Neck: no adenopathy, supple, symmetrical, trachea midline, and thyroid not enlarged, symmetric, no tenderness/mass/nodules Lungs: clear to auscultation bilaterally Breasts: normal appearance, no masses or tenderness Heart: regular rate and rhythm, S1, S2 normal, no murmur, click, rub or gallop Abdomen: soft, non-tender; bowel sounds normal; no masses,  no organomegaly Extremities: extremities normal, atraumatic, no cyanosis or edema Pulses: 2+ and symmetric Skin: Skin color, texture, turgor normal. No rashes or lesions Lymph nodes: Cervical, supraclavicular, and axillary nodes normal. Neurologic: Grossly normal    Assessment:    Healthy female exam.      Plan:  Encounter for gynecological examination without abnormal finding  Screen for colon cancer - Plan: Ambulatory referral to Gastroenterology  Encounter for screening mammogram for malignant neoplasm of breast - Plan: MM 3D SCREEN BREAST BILATERAL  Return in 1 year (on 12/19/2022).    See After Visit Summary for Counseling Recommendations

## 2021-12-18 NOTE — Progress Notes (Signed)
Patient presents for Annual.  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

## 2021-12-20 ENCOUNTER — Ambulatory Visit (INDEPENDENT_AMBULATORY_CARE_PROVIDER_SITE_OTHER): Payer: 59 | Admitting: Physician Assistant

## 2021-12-20 ENCOUNTER — Encounter: Payer: Self-pay | Admitting: Physician Assistant

## 2021-12-20 VITALS — BP 121/75 | HR 91 | Wt 183.0 lb

## 2021-12-20 DIAGNOSIS — G43009 Migraine without aura, not intractable, without status migrainosus: Secondary | ICD-10-CM | POA: Diagnosis not present

## 2021-12-20 MED ORDER — BACLOFEN 10 MG PO TABS: 10.0000 mg | ORAL_TABLET | Freq: Three times a day (TID) | ORAL | 1 refills | Status: DC

## 2021-12-20 MED ORDER — UBRELVY 100 MG PO TABS: 1.0000 | ORAL_TABLET | ORAL | 11 refills | Status: DC | PRN

## 2021-12-20 MED ORDER — ATOGEPANT 60 MG PO TABS: 60.0000 mg | ORAL_TABLET | Freq: Every day | ORAL | 11 refills | Status: DC

## 2021-12-20 MED ORDER — VENLAFAXINE HCL ER 37.5 MG PO CP24: 37.5000 mg | ORAL_CAPSULE | Freq: Two times a day (BID) | ORAL | 1 refills | Status: DC

## 2021-12-20 MED ORDER — REYVOW 50 MG PO TABS: 50.0000 mg | ORAL_TABLET | Freq: Every day | ORAL | 5 refills | Status: DC | PRN

## 2021-12-20 NOTE — Progress Notes (Signed)
Pt wants to discuss some type of preventive medication for HA's  Pt states HA's are more frequent now.  3-4 days a wk and are intense.   History:  Kimberly [name] is a 51 y.o. G1P1 who presents to clinic today for headache eval.  She has been getting medications from PCP but returns as she is noting more headaches. Her medications that previously worked very well are no longer working at all. She notes her work/family/home life is relatively calm at this time.   Number of days in the last 4 weeks with:  Severe headache: 3 Moderate headache: 6 Mild headache: 8  No headache: 11   Past Medical History:  Diagnosis Date   Anorectal fistula    Anxiety    Arthritis    neck, hips   Chronic headaches    migraines   History of hypertension    since wt loss after gastric sleeve 2014--  no longer issue   Irritable bowel syndrome    PONV (postoperative nausea and vomiting)    slow to wake up   Wears glasses     Social History   Socioeconomic History   Marital status: Divorced    Spouse name: Not on file   Number of children: 1   Years of education: Not on file   Highest education level: Not on file  Occupational History   Occupation: Psychologist, sport and exercise  Tobacco Use   Smoking status: Never   Smokeless tobacco: Never  Vaping Use   Vaping Use: Never used  Substance and Sexual Activity   Alcohol use: Yes    Comment: occasional   Drug use: No   Sexual activity: Yes    Birth control/protection: Surgical  Other Topics Concern   Not on file  Social History Narrative   Not on file   Social Determinants of Health   Financial Resource Strain: Not on file  Food Insecurity: Not on file  Transportation Needs: Not on file  Physical Activity: Not on file  Stress: Not on file  Social Connections: Not on file  Intimate Partner Violence: Not on file    Family History  Problem Relation Age of Onset   Stroke Mother    Diabetes Mother    Uterine cancer Mother 21   Cancer Mother         endometrial   Atrial fibrillation Mother    Colitis Father    Crohn's disease Father    Diabetes Father    Diverticulitis Father    Diabetes Sister    Uterine cancer Sister 75   Cancer Sister        endometrial   Irritable bowel syndrome Sister    Irritable bowel syndrome Brother    Ovarian cancer Paternal Grandmother    Cancer Paternal Grandmother 36       ovarian   Cancer Maternal Uncle        lung   Colon cancer Paternal Uncle     Allergies  Allergen Reactions   Adhesive [Tape] Rash    Current Outpatient Medications on File Prior to Visit  Medication Sig Dispense Refill   ALPRAZolam (XANAX) 0.5 MG tablet TAKE 1 TABLET BY MOUTH DAILY AS NEEDED FOR ANXIETY 20 tablet 1   busPIRone (BUSPAR) 10 MG tablet TAKE 1 TABLET(10 MG) BY MOUTH THREE TIMES DAILY 90 tablet 3   ibuprofen (ADVIL,MOTRIN) 200 MG tablet Take 800 mg by mouth daily as needed for headache or moderate pain.     naproxen (NAPROSYN) 500  MG tablet TAKE 1 TABLET(500 MG) BY MOUTH TWICE DAILY WITH A MEAL 90 tablet 3   olmesartan-hydrochlorothiazide (BENICAR HCT) 20-12.5 MG tablet Take 1 tablet by mouth daily. 90 tablet 3   ondansetron (ZOFRAN) 8 MG tablet Take 1 tablet (8 mg total) by mouth every 8 (eight) hours as needed for nausea or vomiting. 40 tablet 2   tiZANidine (ZANAFLEX) 4 MG tablet      UBRELVY 100 MG TABS TAKE 1 TABLET BY MOUTH AS NEEDED 8 tablet 11   valACYclovir (VALTREX) 1000 MG tablet TAKE 1 TABLET BY MOUTH ONCE DAILY 90 tablet 0   cyclobenzaprine (FLEXERIL) 10 MG tablet Take 1 tablet (10 mg total) by mouth 3 (three) times daily as needed for muscle spasms. (Patient not taking: Reported on 12/18/2021) 40 tablet 6   rizatriptan (MAXALT-MLT) 10 MG disintegrating tablet Take 1 tablet (10 mg total) by mouth as needed for migraine. May repeat in 2 hours if needed (Patient not taking: Reported on 12/18/2021) 30 tablet 3   No current facility-administered medications on file prior to visit.     Review of  Systems:  All pertinent positive/negative included in HPI, all other review of systems are negative   Objective:  Physical Exam BP 121/75   Pulse 91   Wt 183 lb (83 kg)   LMP 01/09/2017 (Exact Date)   BMI 35.74 kg/m  CONSTITUTIONAL: Well-developed, well-nourished female in no acute distress.  EYES: EOM intact ENT: Normocephalic CARDIOVASCULAR: Regular rate  RESPIRATORY: Normal rate.  MUSCULOSKELETAL: Normal ROM SKIN: Warm, dry without erythema  NEUROLOGICAL: Alert, oriented, CN II-XII grossly intact, Appropriate balance PSYCH: Normal behavior, mood   Assessment & Plan:  Assessment: 1. Migraine without aura and without status migrainosus, not intractable      Plan: Begin Qulipta for prevention of migraine Okay to continue Ubrelvy if helpful/desired. Sample of Zavzpret provided and pt may call to request rx. (It is unlikely that all 3 of these CGRP based meds will work.  Pt will trial and let us know which is most beneficial.) Will trial baclofen in place of other muscle relaxants. Okay to begin Effexor to help with migraine and mood and vasomotor symptoms.    Follow-up in 3 months or sooner PRN  51 minutes spent face to face with patient.  [name], PA-C 12/20/2021 10:36 AM

## 2021-12-20 NOTE — Patient Instructions (Signed)
Migraine Headache  A migraine headache is a very strong throbbing pain on one side or both sides of your head. This type of headache can also cause other symptoms. It can last from 4 hours to 3 days. Talk with your doctor about what things may bring on (trigger) this condition.  What are the causes?  The exact cause of this condition is not known. This condition may be triggered or caused by:  Drinking alcohol.  Smoking.  Taking medicines, such as:  Medicine used to treat chest pain (nitroglycerin).  Birth control pills.  Estrogen.  Some blood pressure medicines.  Eating or drinking certain products.  Doing physical activity.  Other things that may trigger a migraine headache include:  Having a menstrual period.  Pregnancy.  Hunger.  Stress.  Not getting enough sleep or getting too much sleep.  Weather changes.  Tiredness (fatigue).  What increases the risk?  Being 25-55 years old.  Being female.  Having a family history of migraine headaches.  Being Caucasian.  Having depression or anxiety.  Being very overweight.  What are the signs or symptoms?  A throbbing pain. This pain may:  Happen in any area of the head, such as on one side or both sides.  Make it hard to do daily activities.  Get worse with physical activity.  Get worse around bright lights or loud noises.  Other symptoms may include:  Feeling sick to your stomach (nauseous).  Vomiting.  Dizziness.  Being sensitive to bright lights, loud noises, or smells.  Before you get a migraine headache, you may get warning signs (an aura). An aura may include:  Seeing flashing lights or having blind spots.  Seeing bright spots, halos, or zigzag lines.  Having tunnel vision or blurred vision.  Having numbness or a tingling feeling.  Having trouble talking.  Having weak muscles.  Some people have symptoms after a migraine headache (postdromal phase), such as:  Tiredness.  Trouble thinking (concentrating).  How is this treated?  Taking medicines that:  Relieve  pain.  Relieve the feeling of being sick to your stomach.  Prevent migraine headaches.  Treatment may also include:  Having acupuncture.  Avoiding foods that bring on migraine headaches.  Learning ways to control your body functions (biofeedback).  Therapy to help you know and deal with negative thoughts (cognitive behavioral therapy).  Follow these instructions at home:  Medicines  Take over-the-counter and prescription medicines only as told by your doctor.  Ask your doctor if the medicine prescribed to you:  Requires you to avoid driving or using heavy machinery.  Can cause trouble pooping (constipation). You may need to take these steps to prevent or treat trouble pooping:  Drink enough fluid to keep your pee (urine) pale yellow.  Take over-the-counter or prescription medicines.  Eat foods that are high in fiber. These include beans, whole grains, and fresh fruits and vegetables.  Limit foods that are high in fat and sugar. These include fried or sweet foods.  Lifestyle  Do not drink alcohol.  Do not use any products that contain nicotine or tobacco, such as cigarettes, e-cigarettes, and chewing tobacco. If you need help quitting, ask your doctor.  Get at least 8 hours of sleep every night.  Limit and deal with stress.  General instructions  Keep a journal to find out what may bring on your migraine headaches. For example, write down:  What you eat and drink.  How much sleep you get.  Any change in   what you eat or drink.  Any change in your medicines.  If you have a migraine headache:  Avoid things that make your symptoms worse, such as bright lights.  It may help to lie down in a dark, quiet room.  Do not drive or use heavy machinery.  Ask your doctor what activities are safe for you.  Keep all follow-up visits as told by your doctor. This is important.  Contact a doctor if:  You get a migraine headache that is different or worse than others you have had.  You have more than 15 headache days in one month.  Get  help right away if:  Your migraine headache gets very bad.  Your migraine headache lasts longer than 72 hours.  You have a fever.  You have a stiff neck.  You have trouble seeing.  Your muscles feel weak or like you cannot control them.  You start to lose your balance a lot.  You start to have trouble walking.  You pass out (faint).  You have a seizure.  Summary  A migraine headache is a very strong throbbing pain on one side or both sides of your head. These headaches can also cause other symptoms.  This condition may be treated with medicines and changes to your lifestyle.  Keep a journal to find out what may bring on your migraine headaches.  Contact a doctor if you get a migraine headache that is different or worse than others you have had.  Contact your doctor if you have more than 15 headache days in a month.  This information is not intended to replace advice given to you by your health care provider. Make sure you discuss any questions you have with your health care provider.  Document Revised: 06/06/2021 Document Reviewed: 02/04/2018  Elsevier Patient Education  2023 [employer]

## 2021-12-24 ENCOUNTER — Encounter: Payer: Self-pay | Admitting: *Deleted

## 2022-01-16 ENCOUNTER — Other Ambulatory Visit: Payer: Self-pay | Admitting: Family Medicine

## 2022-01-16 ENCOUNTER — Other Ambulatory Visit: Payer: Self-pay | Admitting: Physician Assistant

## 2022-01-16 ENCOUNTER — Encounter: Payer: Self-pay | Admitting: *Deleted

## 2022-01-16 DIAGNOSIS — Z1231 Encounter for screening mammogram for malignant neoplasm of breast: Secondary | ICD-10-CM

## 2022-01-19 ENCOUNTER — Other Ambulatory Visit: Payer: Self-pay | Admitting: Physician Assistant

## 2022-01-21 ENCOUNTER — Encounter: Payer: Self-pay | Admitting: Internal Medicine

## 2022-02-04 ENCOUNTER — Other Ambulatory Visit: Payer: Self-pay | Admitting: Physician Assistant

## 2022-02-14 ENCOUNTER — Other Ambulatory Visit: Payer: Self-pay | Admitting: Physician Assistant

## 2022-03-07 ENCOUNTER — Encounter: Payer: Self-pay | Admitting: Internal Medicine

## 2022-03-07 ENCOUNTER — Ambulatory Visit (AMBULATORY_SURGERY_CENTER): Payer: 59 | Admitting: *Deleted

## 2022-03-07 VITALS — Ht 60.0 in | Wt 167.0 lb

## 2022-03-07 DIAGNOSIS — Z1211 Encounter for screening for malignant neoplasm of colon: Secondary | ICD-10-CM

## 2022-03-07 MED ORDER — NA SULFATE-K SULFATE-MG SULF 17.5-3.13-1.6 GM/177ML PO SOLN: 1.0000 | Freq: Once | ORAL | 0 refills | Status: AC

## 2022-03-07 NOTE — Progress Notes (Signed)
No egg or soy allergy known to patient  No issues known to pt with past sedation with any surgeries or procedures Patient denies ever being told they had issues or difficulty with intubation  No FH of Malignant Hyperthermia Pt is not on diet pills Pt is not on  home 02  Pt is not on blood thinners  Pt denies issues with constipation  Pt is not on dialysis Pt denies any upcoming cardiac testing Pt encouraged to use to use Singlecare or Goodrx to reduce cost  Patient's chart reviewed by Osvaldo Angst CNRA prior to previsit and patient appropriate for the [facility].  Previsit completed and red dot placed by patient's name on their procedure day (on provider's schedule).  . Visit by phone Instructions reviewed with pt and pt states understanding. Instructed to review again prior to procedure. Pt states they will. Instructions sent by mail with coupon and by my chart

## 2022-03-13 ENCOUNTER — Encounter: Payer: Self-pay | Admitting: Radiology

## 2022-03-14 ENCOUNTER — Ambulatory Visit
Admission: RE | Admit: 2022-03-14 | Discharge: 2022-03-14 | Disposition: A | Discharge status: Home / Self Care | Payer: 59 | Source: Ambulatory Visit

## 2022-03-14 DIAGNOSIS — Z1231 Encounter for screening mammogram for malignant neoplasm of breast: Secondary | ICD-10-CM

## 2022-03-28 ENCOUNTER — Ambulatory Visit (AMBULATORY_SURGERY_CENTER): Payer: 59 | Admitting: Internal Medicine

## 2022-03-28 ENCOUNTER — Encounter: Payer: Self-pay | Admitting: Internal Medicine

## 2022-03-28 VITALS — BP 138/81 | HR 67 | Temp 97.7°F | Resp 13 | Ht 60.0 in | Wt 167.0 lb

## 2022-03-28 DIAGNOSIS — Z1211 Encounter for screening for malignant neoplasm of colon: Secondary | ICD-10-CM

## 2022-03-28 DIAGNOSIS — D120 Benign neoplasm of cecum: Secondary | ICD-10-CM

## 2022-03-28 DIAGNOSIS — Z86010 Personal history of colonic polyps: Secondary | ICD-10-CM

## 2022-03-28 DIAGNOSIS — Z860101 Personal history of adenomatous and serrated colon polyps: Secondary | ICD-10-CM

## 2022-03-28 HISTORY — DX: Personal history of adenomatous and serrated colon polyps: Z86.0101

## 2022-03-28 MED ORDER — SODIUM CHLORIDE 0.9 % IV SOLN: 500.0000 mL | INTRAVENOUS | Status: DC

## 2022-03-28 NOTE — Progress Notes (Signed)
Report to PACU, RN, vss, BBS= Clear.  

## 2022-03-28 NOTE — Op Note (Signed)
[facility] Patient Name: Kimberly [name] Procedure Date: 03/28/2022 7:17 AM MRN: [id number] Endoscopist: Gatha Mayer , MD, [id number] Age: 52 Referring MD:  Date of Birth: April 20, 1970 Gender: Female Account #: [id number] Procedure:                Colonoscopy Indications:              Screening for colorectal malignant neoplasm, Last                            colonoscopy: 2012 Medicines:                Monitored Anesthesia Care Procedure:                Pre-Anesthesia Assessment:                           - Prior to the procedure, a History and Physical                            was performed, and patient medications and                            allergies were reviewed. The patient's tolerance of                            previous anesthesia was also reviewed. The risks                            and benefits of the procedure and the sedation                            options and risks were discussed with the patient.                            All questions were answered, and informed consent                            was obtained. Prior Anticoagulants: The patient has                            taken no anticoagulant or antiplatelet agents. ASA                            Grade Assessment: II - A patient with mild systemic                            disease. After reviewing the risks and benefits,                            the patient was deemed in satisfactory condition to                            undergo the procedure.  After obtaining informed consent, the colonoscope                            was passed under direct vision. Throughout the                            procedure, the patient's blood pressure, pulse, and                            oxygen saturations were monitored continuously. The                            Olympus [id number]) Colonoscope was                            introduced through the anus and advanced to  the the                            cecum, identified by appendiceal orifice and                            ileocecal valve. The colonoscopy was performed                            without difficulty. The patient tolerated the                            procedure well. The quality of the bowel                            preparation was good. The ileocecal valve,                            appendiceal orifice, and rectum were photographed.                            The bowel preparation used was SUPREP via split                            dose instruction. Scope In: 7:59:45 AM Scope Out: 8:17:36 AM Scope Withdrawal Time: 0 hours 13 minutes 46 seconds  Total Procedure Duration: 0 hours 17 minutes 51 seconds  Findings:                 The perianal and digital rectal examinations were                            normal.                           Two sessile polyps were found in the cecum. The                            polyps were diminutive in size. These polyps were  removed with a cold snare. Resection and retrieval                            were complete. Verification of patient                            identification for the specimen was done. Estimated                            blood loss was minimal.                           Anal papilla(e) were hypertrophied.                           The exam was otherwise without abnormality on                            direct and retroflexion views. Complications:            No immediate complications. Estimated Blood Loss:     Estimated blood loss was minimal. Impression:               - Two diminutive polyps in the cecum, removed with                            a cold snare. Resected and retrieved.                           - Anal papilla(e) were hypertrophied.                           - The examination was otherwise normal on direct                            and retroflexion views. Recommendation:           -  Patient has a contact number available for                            emergencies. The signs and symptoms of potential                            delayed complications were discussed with the                            patient. Return to normal activities tomorrow.                            Written discharge instructions were provided to the                            patient.                           - Resume previous diet.                           -  Continue present medications.                           - Repeat colonoscopy is recommended. The                            colonoscopy date will be determined after pathology                            results from today's exam become available for                            review. Gatha Mayer, MD 03/28/2022 8:29:50 AM This report has been signed electronically.

## 2022-03-28 NOTE — Progress Notes (Signed)
[facility] History and Physical   Primary Care Physician:  Pcp, No   Reason for Procedure:   CRCA screening  Plan:    colonoscopy     HPI: Kimberly [name] is a 52 y.o. female for screening exam   Past Medical History:  Diagnosis Date   Anorectal fistula    Anxiety    Arthritis    neck, hips   Chronic headaches    migraines   History of hypertension    since wt loss after gastric sleeve 2014--  no longer issue   Hypertension    Irritable bowel syndrome    PONV (postoperative nausea and vomiting)    slow to wake up   Wears glasses     Past Surgical History:  Procedure Laterality Date   BREAST REDUCTION SURGERY  1996   CESAREAN SECTION  2001   COLONOSCOPY  10-18-2010   w/ bx's   ESOPHAGOGASTRODUODENOSCOPY N/A 06/29/2012   Procedure: ESOPHAGOGASTRODUODENOSCOPY (EGD);  Surgeon: Madilyn Hook, DO;  Location: [facility];  Service: General;  Laterality: N/A;   EVALUATION UNDER ANESTHESIA WITH ANAL FISTULECTOMY N/A 04/14/2014   Procedure: ANAL EXAM UNDER ANESTHESIA WITH  FISTULOTOMY;  Surgeon: [name], MD;  Location: [facility];  Service: General;  Laterality: N/A;   LAPAROSCOPIC BILATERAL SALPINGECTOMY N/A 06/29/2012   Procedure: LAPAROSCOPIC BILATERAL SALPINGECTOMY;  Surgeon: Donnamae Jude, MD;  Location: [facility];  Service: Gynecology;  Laterality: N/A;   LAPAROSCOPIC GASTRIC SLEEVE RESECTION N/A 06/29/2012   Procedure: LAPAROSCOPIC GASTRIC SLEEVE RESECTION;  Surgeon: Madilyn Hook, DO;  Location: [facility];  Service: General;  Laterality: N/A;  Laparoscopic Sleeve Gastrectomy with EGD   REDUCTION MAMMAPLASTY Bilateral    VAGINAL HYSTERECTOMY N/A 01/13/2017   Procedure: HYSTERECTOMY VAGINAL;  Surgeon: Donnamae Jude, MD;  Location: [facility];  Service: Gynecology;  Laterality: N/A;    Prior to Admission medications   Medication Sig Start Date End Date Taking? Authorizing Provider  Atogepant 60 MG TABS Take 60 mg by mouth daily. 12/20/21  Yes Teague Bobbye Morton, PA-C  busPIRone (BUSPAR) 10 MG tablet TAKE 1 TABLET(10 MG) BY MOUTH THREE TIMES DAILY 01/24/22  Yes Teague Bobbye Morton, PA-C  Multiple Vitamin (MULTI-VITAMIN DAILY PO)  06/23/18  Yes [provider]  naproxen (NAPROSYN) 500 MG tablet TAKE 1 TABLET(500 MG) BY MOUTH TWICE DAILY WITH A MEAL 06/15/20  Yes Teague Bobbye Morton, PA-C  olmesartan-hydrochlorothiazide (BENICAR HCT) 20-12.5 MG tablet Take 1 tablet by mouth daily. 02/17/20  Yes Donnamae Jude, MD  valACYclovir (VALTREX) 1000 MG tablet TAKE 1 TABLET BY MOUTH ONCE DAILY 07/03/17  Yes Teague Bobbye Morton, PA-C  venlafaxine XR (EFFEXOR-XR) 37.5 MG 24 hr capsule TAKE 1 CAPSULE(37.5 MG) BY MOUTH IN THE MORNING AND AT BEDTIME 02/17/22  Yes Teague Bobbye Morton, PA-C  ALPRAZolam Duanne Moron) 0.5 MG tablet TAKE 1 TABLET BY MOUTH DAILY AS NEEDED FOR ANXIETY 02/17/20   Jaclyn Prime, Collene Leyden, PA-C  baclofen (LIORESAL) 10 MG tablet TAKE 1 TABLET(10 MG) BY MOUTH THREE TIMES DAILY 02/07/22   Jaclyn Prime, Collene Leyden, PA-C  cyclobenzaprine (FLEXERIL) 10 MG tablet Take 1 tablet (10 mg total) by mouth 3 (three) times daily as needed for muscle spasms. Patient not taking: Reported on 12/18/2021 11/26/18   Jaclyn Prime, Collene Leyden, PA-C  ibuprofen (ADVIL,MOTRIN) 200 MG tablet Take 800 mg by mouth daily as needed for headache or moderate pain. Patient not taking: Reported on 03/07/2022    [provider]  Lasmiditan Succinate (REYVOW) 50 MG TABS Take 50 mg by mouth daily as needed (migraine). 12/20/21   Jaclyn Prime, Collene Leyden, PA-C  ondansetron (ZOFRAN) 8 MG tablet Take 1 tablet (8 mg total) by mouth every 8 (eight) hours as needed for nausea or vomiting. 11/26/18   Jaclyn Prime, Collene Leyden, PA-C  rizatriptan (MAXALT-MLT) 10 MG disintegrating tablet Take 1 tablet (10 mg total) by mouth as needed for migraine. May repeat in 2 hours if needed Patient not taking: Reported on 12/18/2021 11/26/18   Jaclyn Prime, Collene Leyden, PA-C  Semaglutide, 1 MG/DOSE, (OZEMPIC,  1 MG/DOSE,) 4 MG/3ML SOPN Inject into the skin. Patient not taking: Reported on 03/07/2022 03/20/21   [provider]  tiZANidine (ZANAFLEX) 4 MG tablet  11/11/18   [provider]  Ubrogepant (UBRELVY) 100 MG TABS Take 1 tablet by mouth as needed. 12/20/21   [name], PA-C    Current Outpatient Medications  Medication Sig Dispense Refill   Atogepant 60 MG TABS Take 60 mg by mouth daily. 30 tablet 11   busPIRone (BUSPAR) 10 MG tablet TAKE 1 TABLET(10 MG) BY MOUTH THREE TIMES DAILY 90 tablet 3   Multiple Vitamin (MULTI-VITAMIN DAILY PO)      naproxen (NAPROSYN) 500 MG tablet TAKE 1 TABLET(500 MG) BY MOUTH TWICE DAILY WITH A MEAL 90 tablet 3   olmesartan-hydrochlorothiazide (BENICAR HCT) 20-12.5 MG tablet Take 1 tablet by mouth daily. 90 tablet 3   valACYclovir (VALTREX) 1000 MG tablet TAKE 1 TABLET BY MOUTH ONCE DAILY 90 tablet 0   venlafaxine XR (EFFEXOR-XR) 37.5 MG 24 hr capsule TAKE 1 CAPSULE(37.5 MG) BY MOUTH IN THE MORNING AND AT BEDTIME 180 capsule 2   ALPRAZolam (XANAX) 0.5 MG tablet TAKE 1 TABLET BY MOUTH DAILY AS NEEDED FOR ANXIETY 20 tablet 1   baclofen (LIORESAL) 10 MG tablet TAKE 1 TABLET(10 MG) BY MOUTH THREE TIMES DAILY 60 tablet 1   cyclobenzaprine (FLEXERIL) 10 MG tablet Take 1 tablet (10 mg total) by mouth 3 (three) times daily as needed for muscle spasms. (Patient not taking: Reported on 12/18/2021) 40 tablet 6   ibuprofen (ADVIL,MOTRIN) 200 MG tablet Take 800 mg by mouth daily as needed for headache or moderate pain. (Patient not taking: Reported on 03/07/2022)     Lasmiditan Succinate (REYVOW) 50 MG TABS Take 50 mg by mouth daily as needed (migraine). 8 tablet 5   ondansetron (ZOFRAN) 8 MG tablet Take 1 tablet (8 mg total) by mouth every 8 (eight) hours as needed for nausea or vomiting. 40 tablet 2   rizatriptan (MAXALT-MLT) 10 MG disintegrating tablet Take 1 tablet (10 mg total) by mouth as needed for migraine. May repeat in 2 hours if needed (Patient  not taking: Reported on 12/18/2021) 30 tablet 3   Semaglutide, 1 MG/DOSE, (OZEMPIC, 1 MG/DOSE,) 4 MG/3ML SOPN Inject into the skin. (Patient not taking: Reported on 03/07/2022)     tiZANidine (ZANAFLEX) 4 MG tablet  (Patient not taking: Reported on 03/07/2022)     Ubrogepant (UBRELVY) 100 MG TABS Take 1 tablet by mouth as needed. 8 tablet 11   Current Facility-Administered Medications  Medication Dose Route Frequency Provider Last Rate Last Admin   0.9 %  sodium chloride infusion  500 mL Intravenous Continuous Gatha Mayer, MD        Allergies as of 03/28/2022 - Review Complete 03/07/2022  Allergen Reaction Noted   Tetanus toxoid Other (See Comments) 03/28/2022   Adhesive [tape] Rash 06/18/2012  Family History  Problem Relation Age of Onset   Stroke Mother    Diabetes Mother    Uterine cancer Mother 29   Cancer Mother        endometrial   Atrial fibrillation Mother    Colitis Father    Crohn's disease Father    Diabetes Father    Diverticulitis Father    Diabetes Sister    Uterine cancer Sister 22   Cancer Sister        endometrial   Irritable bowel syndrome Sister    Irritable bowel syndrome Brother    Ovarian cancer Paternal Grandmother    Cancer Paternal Grandmother 69       ovarian   Cancer Maternal Uncle        lung   Colon cancer Paternal Uncle     Social History   Socioeconomic History   Marital status: Divorced    Spouse name: Not on file   Number of children: 1   Years of education: Not on file   Highest education level: Not on file  Occupational History   Occupation: Psychologist, sport and exercise  Tobacco Use   Smoking status: Never   Smokeless tobacco: Never  Vaping Use   Vaping Use: Never used  Substance and Sexual Activity   Alcohol use: Yes    Comment: occasional   Drug use: No   Sexual activity: Yes    Birth control/protection: Surgical  Other Topics Concern   Not on file  Social History Narrative   Not on file   Social Determinants of Health    Financial Resource Strain: Not on file  Food Insecurity: Not on file  Transportation Needs: Not on file  Physical Activity: Not on file  Stress: Not on file  Social Connections: Not on file  Intimate Partner Violence: Not on file    Review of Systems:  All other review of systems negative except as mentioned in the HPI.  Physical Exam: Vital signs BP 124/72   Pulse 76   Temp 97.7 F (36.5 C)   Ht 5' (1.524 m)   Wt 167 lb (75.8 kg)   LMP 01/09/2017 (Exact Date)   SpO2 98%   BMI 32.61 kg/m   General:   Alert,  Well-developed, well-nourished, pleasant and cooperative in NAD Lungs:  Clear throughout to auscultation.   Heart:  Regular rate and rhythm; no murmurs, clicks, rubs,  or gallops. Abdomen:  Soft, nontender and nondistended. Normal bowel sounds.   Neuro/Psych:  Alert and cooperative. Normal mood and affect. A and O x 3   @Kimberly [name]  Simonne Maffucci, MD, [facility] [phone] (pager) 03/28/2022 7:49 AM@

## 2022-03-28 NOTE — Patient Instructions (Addendum)
I found and removed 2 tiny polyps. I will let you know pathology results and when to have another routine colonoscopy by mail and/or My Chart.  I appreciate the opportunity to care for you. Gatha Mayer, MD, [facility]  Handout provided on polyps.   Resume previous diet.  Continue present medications.  Repeat colonoscopy is recommended. The colonoscopy date will be determined after pathology results from today's exam become available for review.   YOU HAD AN ENDOSCOPIC PROCEDURE TODAY AT [facility]:   Refer to the procedure report that was given to you for any specific questions about what was found during the examination.  If the procedure report does not answer your questions, please call your gastroenterologist to clarify.  If you requested that your care partner not be given the details of your procedure findings, then the procedure report has been included in a sealed envelope for you to review at your convenience later.  YOU SHOULD EXPECT: Some feelings of bloating in the abdomen. Passage of more gas than usual.  Walking can help get rid of the air that was put into your GI tract during the procedure and reduce the bloating. If you had a lower endoscopy (such as a colonoscopy or flexible sigmoidoscopy) you may notice spotting of blood in your stool or on the toilet paper. If you underwent a bowel prep for your procedure, you may not have a normal bowel movement for a few days.  Please Note:  You might notice some irritation and congestion in your nose or some drainage.  This is from the oxygen used during your procedure.  There is no need for concern and it should clear up in a day or so.  SYMPTOMS TO REPORT IMMEDIATELY:  Following lower endoscopy (colonoscopy or flexible sigmoidoscopy):  Excessive amounts of blood in the stool  Significant tenderness or worsening of abdominal pains  Swelling of the abdomen that is new, acute  Fever of 100F or higher  For urgent or  emergent issues, a gastroenterologist can be reached at any hour by calling [phone]. Do not use MyChart messaging for urgent concerns.    DIET:  We do recommend a small meal at first, but then you may proceed to your regular diet.  Drink plenty of fluids but you should avoid alcoholic beverages for 24 hours.  ACTIVITY:  You should plan to take it easy for the rest of today and you should NOT DRIVE or use heavy machinery until tomorrow (because of the sedation medicines used during the test).    FOLLOW UP: Our staff will call the number listed on your records the next business day following your procedure.  We will call around 7:15- 8:00 am to check on you and address any questions or concerns that you may have regarding the information given to you following your procedure. If we do not reach you, we will leave a message.     If any biopsies were taken you will be contacted by phone or by letter within the next 1-3 weeks.  Please call us at [phone] if you have not heard about the biopsies in 3 weeks.    SIGNATURES/CONFIDENTIALITY: You and/or your care partner have signed paperwork which will be entered into your electronic medical record.  These signatures attest to the fact that that the information above on your After Visit Summary has been reviewed and is understood.  Full responsibility of the confidentiality of this discharge information lies with you and/or  your care-partner.

## 2022-03-28 NOTE — Progress Notes (Signed)
Called to room to assist during endoscopic procedure.  Patient ID and intended procedure confirmed with present staff. Received instructions for my participation in the procedure from the performing physician.  

## 2022-03-31 ENCOUNTER — Telehealth: Payer: Self-pay

## 2022-03-31 NOTE — Telephone Encounter (Signed)
Follow up call placed, no answer and no vm

## 2022-04-09 ENCOUNTER — Encounter: Payer: Self-pay | Admitting: Internal Medicine

## 2022-04-24 ENCOUNTER — Other Ambulatory Visit: Payer: Self-pay

## 2022-04-24 ENCOUNTER — Telehealth: Payer: Self-pay

## 2022-04-24 DIAGNOSIS — S86312A Strain of muscle(s) and tendon(s) of peroneal muscle group at lower leg level, left leg, initial encounter: Secondary | ICD-10-CM

## 2022-04-24 DIAGNOSIS — M25572 Pain in left ankle and joints of left foot: Secondary | ICD-10-CM

## 2022-04-24 NOTE — Telephone Encounter (Signed)
noted 

## 2022-04-24 NOTE — Telephone Encounter (Signed)
Can we take out the xray I put in on this ankle by mistake? I meant to put in MRI, not xray  I don't know how to delete it

## 2022-05-08 ENCOUNTER — Encounter: Payer: Self-pay | Admitting: Physician Assistant

## 2022-05-12 ENCOUNTER — Ambulatory Visit
Admission: RE | Admit: 2022-05-12 | Discharge: 2022-05-12 | Disposition: A | Discharge status: Home / Self Care | Payer: 59 | Source: Ambulatory Visit | Attending: Physician Assistant | Admitting: Physician Assistant

## 2022-05-12 DIAGNOSIS — M25572 Pain in left ankle and joints of left foot: Secondary | ICD-10-CM

## 2022-05-12 DIAGNOSIS — S86312A Strain of muscle(s) and tendon(s) of peroneal muscle group at lower leg level, left leg, initial encounter: Secondary | ICD-10-CM

## 2022-05-13 ENCOUNTER — Telehealth: Payer: Self-pay | Admitting: Physician Assistant

## 2022-05-13 NOTE — Telephone Encounter (Signed)
Called pt 1X and left vm for pt to call and set an MRI Review appt with PA Bronson Curb after 5/6

## 2022-05-26 ENCOUNTER — Ambulatory Visit (INDEPENDENT_AMBULATORY_CARE_PROVIDER_SITE_OTHER): Payer: 59 | Admitting: Physician Assistant

## 2022-05-26 ENCOUNTER — Encounter: Payer: Self-pay | Admitting: Physician Assistant

## 2022-05-26 DIAGNOSIS — M7662 Achilles tendinitis, left leg: Secondary | ICD-10-CM

## 2022-05-26 DIAGNOSIS — M76822 Posterior tibial tendinitis, left leg: Secondary | ICD-10-CM | POA: Diagnosis not present

## 2022-05-27 ENCOUNTER — Encounter: Payer: Self-pay | Admitting: Physician Assistant

## 2022-05-27 ENCOUNTER — Encounter: Payer: Self-pay | Admitting: *Deleted

## 2022-05-27 NOTE — Progress Notes (Signed)
HPI: Kimberly [name] returns today to review the MRI from the left foot.  She is walking a cam walker boot.  She states she was doing well and about 3 to 4 weeks ago whenever she developed pain she points to the left Achilles region and to the medial aspect of the left foot.  No known injury.  She states that she feels comfortable in the boot but if she comes out of the boot she has increased pain.  She denies any injury.  She has been taking naproxen for pain relief. MRI left foot dated 05/12/2022 showed minimal distal Achilles tendinopathy and mild tendinitis and posterior tibial tendon minimal arthritic changes involving the tibial talar joint and midfoot.  No acute findings i.e. stress fractures or dislocations.  Physical exam:  General: Well-developed well-nourished female no acute distress.  Ambulates without any assistive device and a cam walker boot on the left foot. Left foot dorsal pedal pulse 2+.  Subjective normal sensation throughout the left foot to light touch.  Left Achilles is intact.  She has tenderness over the distal third of the Achilles there is no abnormal warmth erythema.  She has maximal tenderness over the left Achilles lateral insertion. Peroneal tendons on the left foot nontender.  5 out of 5 strength with eversion against resistance.  Tenderness over the left posterior tibial tendon pain with inversion against resistance in the posterior tibial region left foot but no weakness.  She is able to do a single heel raise easily on the right has some difficulty and pain in the posterior tibial region on the left with this.  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  Will send her to formal physical therapy so they can work on modalities for the posterior tibial tendon and the left Achilles tendon.  Therapy to wean her out of the cam walker boot as pain.  She is given 916 heel lifts to place in the left  cam walker boot which she can transition over into her left shoe.  Home exercise program.  Referral to Dr. Shon Baton for evaluation for ultrasound therapy over the left Achilles and left posterior tibial tendons.  Follow-up in 6 weeks.

## 2022-06-05 ENCOUNTER — Other Ambulatory Visit (INDEPENDENT_AMBULATORY_CARE_PROVIDER_SITE_OTHER): Payer: 59

## 2022-06-05 ENCOUNTER — Encounter: Payer: Self-pay | Admitting: Sports Medicine

## 2022-06-05 ENCOUNTER — Ambulatory Visit (INDEPENDENT_AMBULATORY_CARE_PROVIDER_SITE_OTHER): Payer: 59 | Admitting: Sports Medicine

## 2022-06-05 DIAGNOSIS — M25572 Pain in left ankle and joints of left foot: Secondary | ICD-10-CM

## 2022-06-05 DIAGNOSIS — M7662 Achilles tendinitis, left leg: Secondary | ICD-10-CM | POA: Diagnosis not present

## 2022-06-05 DIAGNOSIS — M76822 Posterior tibial tendinitis, left leg: Secondary | ICD-10-CM | POA: Diagnosis not present

## 2022-06-05 NOTE — Progress Notes (Signed)
Kimberly [name] - 52 y.o. female MRN [id number]  Date of birth: 1970/09/02  Office Visit Note: Visit Date: 06/05/2022 PCP: Pcp, No Referred by: Kimberly Bouchard, PA-C  Subjective: Chief Complaint  Patient presents with   Left Ankle - Pain   HPI: Kimberly [name] is a pleasant 52 y.o. female who presents today for medial and posterior left ankle pain.  Had an injury during Zumba about 1.5 months ago which she felt a pain and a mild pop during the workout.  She was able to finish class at that time.  She waited a few weeks to see if her pain would improve but it did not so she was seen here by my orthopedic colleagues, she was placed into a cam walker boot about 4 weeks ago, she is managed on a 916 inch heel lift.  Reviewed recent foot MRI from 05/12/22 which showed mild degree of distal Achilles tendinopathy and tendinitis within the posterior tibial tendon, otherwise only some scattered mild arthritic changes throughout the midfoot and talar joint.    Today, Kimberly [name] tells me her pain is certainly improving from her initial visit, but she is having difficulty coming out of the cam walker boot.  The sharp pain is gone but she has more of a soreness in the posterior and medial ankle.  She does take naproxen 500 mg once daily, occasional over-the-counter anti-inflammatories as needed.  Pertinent ROS were reviewed with the patient and found to be negative unless otherwise specified above in HPI.   Assessment & Plan: Visit Diagnoses:  1. Left tibialis posterior tendinitis   2. Noninsertional tendinopathy of left Achilles tendon   3. Pain in left ankle and joints of left foot    Plan: Discussed with Kimberly [name] today that based on her ultrasound today, previous imaging and exam I think she is dealing more so with posterior tibial tendinitis as there was fluid seen within the sheath.  I do not see any evidence of tearing which is reassuring for her long-term improvement.  Her Achilles looks rather benign on  exam today, I think she may be dealing more so with some soreness from being in her cam walker boot for 1 month.  I would like her to start transitioning out of this into regular tennis shoes with her heel lifts, to use 7/16 inch in both shoes to offload achilles/PT. discussed slowly coming out of the cam walker boot completely over the next 2 weeks.  We did get her started on some home therapy based exercises, I did print out a handout and review posterior tibial exercises for her to perform once to twice daily.  May continue with her naproxen daily as needed.  Would like to see her back in about 1.5 weeks for an additional second trial of extracorporeal shockwave therapy, may proceed with additional treatments if finding improvement.  Follow-up: Return in about 11 days (around 06/16/2022) for for f/u achilles and PT tendon.   Meds & Orders: No orders of the defined types were placed in this encounter.   Orders Placed This Encounter  Procedures   [place] Extrem Low Left Ltd   Ambulatory referral to Physical Therapy     Procedures: Procedure: ECSWT Indications:  Posterior tibial tendinitis / Achilles tendinopathy   Procedure Details Consent: Risks of procedure as well as the alternatives and risks of each were explained to the patient.  Verbal consent for procedure obtained. Time Out: Verified patient identification, verified procedure, site was marked, verified correct  patient position. The area was cleaned with alcohol swab.     The left posterior tibial tendon and achilles tendon was targeted for Extracorporeal shockwave therapy.    Preset: Tendinopathy/Tendinitis Power Level: 90 mJ Frequency: 10Hz  Impulse/cycles: 3600 (1800 at each) Head size: Regular   Patient tolerated procedure well without immediate complications.         Clinical History: No specialty comments available.  She reports that she has never smoked. She has never used smokeless tobacco. No results for input(s):  "HGBA1C", "LABURIC" in the last 8760 hours.  Objective:   Vital Signs: LMP 01/09/2017 (Exact Date)   Physical Exam  Gen: Well-appearing, in no acute distress; non-toxic CV: Regular Rate. Well-perfused. Warm.  Resp: Breathing unlabored on room air; no wheezing. Psych: Fluid speech in conversation; appropriate affect; normal thought process Neuro: Sensation intact throughout. No gross coordination deficits.   Ortho Exam - Left foot/ankle: + Positive TTP over the course of posterior tibial tendon at the level of the medial malleolus and just distal to it.  She is able to perform bilateral heel raise although some pain associated with this.  Mild TTP of the mid aspect of the Achilles tendon upon palpation.  Full range of motion with ankle dorsiflexion and plantarflexion.  Thompson squeeze test intact. NVI.  Imaging: [place] Extrem Low Left Ltd  Result Date: 06/05/2022 Limited musculoskeletal ultrasound of the left lower extremity, left ankle was performed today.  Evaluation of the posterior tibial tendon was seen and visualized on the medial aspect of the medial malleoli.  No cortical irregularity of the malleolus.  There is no evidence of tearing of the posterior tibial tendon but there is a degree of tenosynovitis with surrounding hypoechoic fluid at the level of the medial malleolus and distal to it.  Flexor digitorum longus seen without abnormality.  Tibial nerve and associated neurovascular visualized.  Active and dynamic view of the flexor houses longus shows no abnormality.  The posterior calcaneus was seen without cortical irregularity, there is proper insertion of the Achilles tendon seen in both short and long axis.  There is no significant thickening here very minimal tendinopathy without acute changes of hyperemia.  No evidence of tearing of the Achilles tendon scanning proximally to distally.  Evaluation of the peroneal tendons was limited, tendons are intact, possible very mild split tear of  the peroneus longus versus anisotropy.  No full-thickness tearing.        Past Medical/Family/Surgical/Social History: Medications & Allergies reviewed per EMR, new medications updated. 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   Migraine without aura 09/13/2010   ANA positive 09/13/2010   Polyarthralgia 09/13/2010   Essential hypertension 01/11/2009   DE QUERVAIN'S TENOSYNOVITIS 01/26/2008   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   Past Medical History:  Diagnosis Date   Anorectal fistula    Anxiety    Arthritis    neck, hips   Chronic headaches    migraines   History of hypertension    since wt loss after gastric sleeve 2014--  no longer issue   Hx of adenomatous colonic polyps 03/28/2022   Hypertension    Irritable bowel syndrome    PONV (postoperative nausea and vomiting)    slow to wake up   Wears glasses    Family History  Problem Relation Age of Onset   Stroke Mother  Diabetes Mother    Uterine cancer Mother 59   Cancer Mother        endometrial   Atrial fibrillation Mother    Colitis Father    Crohn's disease Father    Diabetes Father    Diverticulitis Father    Diabetes Sister    Uterine cancer Sister 23   Cancer Sister        endometrial   Irritable bowel syndrome Sister    Irritable bowel syndrome Brother    Ovarian cancer Paternal Grandmother    Cancer Paternal Grandmother 66       ovarian   Cancer Maternal Uncle        lung   Colon cancer Paternal Uncle    Past Surgical History:  Procedure Laterality Date   BREAST REDUCTION SURGERY  1996   CESAREAN SECTION  2001   COLONOSCOPY  10-18-2010   w/ bx's   ESOPHAGOGASTRODUODENOSCOPY N/A 06/29/2012   Procedure: ESOPHAGOGASTRODUODENOSCOPY (EGD);  Surgeon: Lodema Pilot, DO;  Location: [facility];  Service: General;  Laterality: N/A;    EVALUATION UNDER ANESTHESIA WITH ANAL FISTULECTOMY N/A 04/14/2014   Procedure: ANAL EXAM UNDER ANESTHESIA WITH  FISTULOTOMY;  Surgeon: Romie Levee, MD;  Location: [facility];  Service: General;  Laterality: N/A;   LAPAROSCOPIC BILATERAL SALPINGECTOMY N/A 06/29/2012   Procedure: LAPAROSCOPIC BILATERAL SALPINGECTOMY;  Surgeon: Reva Bores, MD;  Location: [facility];  Service: Gynecology;  Laterality: N/A;   LAPAROSCOPIC GASTRIC SLEEVE RESECTION N/A 06/29/2012   Procedure: LAPAROSCOPIC GASTRIC SLEEVE RESECTION;  Surgeon: Lodema Pilot, DO;  Location: [facility];  Service: General;  Laterality: N/A;  Laparoscopic Sleeve Gastrectomy with EGD   REDUCTION MAMMAPLASTY Bilateral    VAGINAL HYSTERECTOMY N/A 01/13/2017   Procedure: HYSTERECTOMY VAGINAL;  Surgeon: Reva Bores, MD;  Location: [facility];  Service: Gynecology;  Laterality: N/A;   Social History   Occupational History   Occupation: Engineer, site  Tobacco Use   Smoking status: Never   Smokeless tobacco: Never  Vaping Use   Vaping Use: Never used  Substance and Sexual Activity   Alcohol use: Yes    Comment: occasional   Drug use: No   Sexual activity: Yes    Birth control/protection: Surgical

## 2022-06-13 ENCOUNTER — Other Ambulatory Visit: Payer: Self-pay | Admitting: *Deleted

## 2022-06-13 ENCOUNTER — Encounter: Payer: Self-pay | Admitting: Family Medicine

## 2022-06-13 MED ORDER — CEFDINIR 300 MG PO CAPS: 300.0000 mg | ORAL_CAPSULE | Freq: Two times a day (BID) | ORAL | 0 refills | Status: AC

## 2022-06-13 MED ORDER — NURTEC 75 MG PO TBDP: 75.0000 mg | ORAL_TABLET | ORAL | 6 refills | Status: DC

## 2022-06-17 ENCOUNTER — Ambulatory Visit (INDEPENDENT_AMBULATORY_CARE_PROVIDER_SITE_OTHER): Payer: 59 | Admitting: Sports Medicine

## 2022-06-17 ENCOUNTER — Encounter: Payer: Self-pay | Admitting: Sports Medicine

## 2022-06-17 DIAGNOSIS — M25572 Pain in left ankle and joints of left foot: Secondary | ICD-10-CM

## 2022-06-17 DIAGNOSIS — M76822 Posterior tibial tendinitis, left leg: Secondary | ICD-10-CM | POA: Diagnosis not present

## 2022-06-17 NOTE — Progress Notes (Signed)
Doing better; still having occasional pain Did go a whole day without boot; and by 1-2pm she was in pain

## 2022-06-17 NOTE — Progress Notes (Signed)
Kimberly [name] - 52 y.o. female MRN [id number]  Date of birth: [birth year]  Office Visit Note: Visit Date: 06/17/2022 PCP: Pcp, No Referred by: No ref. provider found  Subjective: Chief Complaint  Patient presents with   Left Leg - Pain   HPI: Kimberly [name] is a pleasant 52 y.o. female who presents today for left posterior tibial tendinitis and achilles/calf pain.   Kimberly [name] has found improvement after the first extracorporeal shockwave therapy.  She has been able to wean herself out of the boot over the last few days.  No longer having pain over the Achilles, it is localized more so to the medial ankle near the posterior tibial tendon.  Pain is better earlier in the day but will have soreness/pain as she is on her feet for longer periods of time near the end of the day.  Has been able to do some of the home exercises.  Takes naproxen only as needed.   Pertinent ROS were reviewed with the patient and found to be negative unless otherwise specified above in HPI.   Assessment & Plan: Visit Diagnoses:  1. Left tibialis posterior tendinitis   2. Pain in left ankle and joints of left foot    Plan: Discussed with Kimberly [name] that her pathology has localized to the posterior tibial tendon which upon our previous ultrasound did show tenosynovitis with fluid within the sheath.  Her MRI and ultrasound did not reveal any structural abnormalities that indicate she will not fully heal.  We did repeat extracorporeal shockwave trial today, we will have her follow-up next week to see what sort of improvement she is getting.  If she has about 20% improvement or greater from the first 2 sessions we will consider repeat additional treatment.  We did upgrade her home exercise therapy to include bilateral heel raise.  Did previously send to PT but has not heard back yet, currently still pending.  Okay for continuing home exercises at this time.  Additional treatment considerations may include ultrasound-guided posterior  tibial tendon sheath injection, but did discuss with her would likely have to put her back into the boot for about 10 days if we did proceed with this.  Follow-up: Return in about 9 days (around 06/26/2022) for F/u Thurs/Friday of next week for PT tendon pain, left.   Meds & Orders: No orders of the defined types were placed in this encounter.  No orders of the defined types were placed in this encounter.    Procedures: Procedure: ECSWT Indications:  Posterior tibial tendinitis   Procedure Details Consent: Risks of procedure as well as the alternatives and risks of each were explained to the patient.  Verbal consent for procedure obtained. Time Out: Verified patient identification, verified procedure, site was marked, verified correct patient position. The area was cleaned with alcohol swab.     The left posterior tibial tendon was targeted for Extracorporeal shockwave therapy.    Preset: Tendinopathy/Tendinitis Power Level: 10 mJ Frequency: 10Hz  Impulse/cycles: 3000 Head size: Regular   Patient tolerated procedure well without immediate complications      Clinical History: No specialty comments available.  She reports that she has never smoked. She has never used smokeless tobacco. No results for input(s): "HGBA1C", "LABURIC" in the last 8760 hours.  Objective:   Vital Signs: LMP 01/09/2017 (Exact Date)   Physical Exam  Gen: Well-appearing, in no acute distress; non-toxic CV: Well-perfused. Warm.  Resp: Breathing unlabored on room air; no wheezing. Psych: Fluid speech in conversation;  appropriate affect; normal thought process Neuro: Sensation intact throughout. No gross coordination deficits.   Ortho Exam - Left foot/ankle: + TTP over the course of the posterior tibial tendon just posterior to the medial malleolus.  There are some mild soft tissue swelling in this location.  There is no tenderness to palpation of the distal Achilles.  There is some mild calf atrophy of this  left leg compared to the right.  Is able to perform bilateral heel raise gingerly today without significant pain.  Imaging: No results found.  Past Medical/Family/Surgical/Social History: Medications & Allergies reviewed per EMR, new medications updated. 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   Migraine without aura 09/13/2010   ANA positive 09/13/2010   Polyarthralgia 09/13/2010   Essential hypertension 01/11/2009   DE QUERVAIN'S TENOSYNOVITIS 01/26/2008   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   Past Medical History:  Diagnosis Date   Anorectal fistula    Anxiety    Arthritis    neck, hips   Chronic headaches    migraines   History of hypertension    since wt loss after gastric sleeve 2014--  no longer issue   Hx of adenomatous colonic polyps 03/28/2022   Hypertension    Irritable bowel syndrome    PONV (postoperative nausea and vomiting)    slow to wake up   Wears glasses    Family History  Problem Relation Age of Onset   Stroke Mother    Diabetes Mother    Uterine cancer Mother 34   Cancer Mother        endometrial   Atrial fibrillation Mother    Colitis Father    Crohn's disease Father    Diabetes Father    Diverticulitis Father    Diabetes Sister    Uterine cancer Sister 59   Cancer Sister        endometrial   Irritable bowel syndrome Sister    Irritable bowel syndrome Brother    Ovarian cancer Paternal Grandmother    Cancer Paternal Grandmother 49       ovarian   Cancer Maternal Uncle        lung   Colon cancer Paternal Uncle    Past Surgical History:  Procedure Laterality Date   BREAST REDUCTION SURGERY  1996   CESAREAN SECTION  2001   COLONOSCOPY  10-18-2010   w/ bx's   ESOPHAGOGASTRODUODENOSCOPY N/A 06/29/2012   Procedure: ESOPHAGOGASTRODUODENOSCOPY (EGD);  Surgeon: Lodema Pilot,  DO;  Location: [facility];  Service: General;  Laterality: N/A;   EVALUATION UNDER ANESTHESIA WITH ANAL FISTULECTOMY N/A 04/14/2014   Procedure: ANAL EXAM UNDER ANESTHESIA WITH  FISTULOTOMY;  Surgeon: Romie Levee, MD;  Location: [facility] [place];  Service: General;  Laterality: N/A;   LAPAROSCOPIC BILATERAL SALPINGECTOMY N/A 06/29/2012   Procedure: LAPAROSCOPIC BILATERAL SALPINGECTOMY;  Surgeon: Reva Bores, MD;  Location: [facility];  Service: Gynecology;  Laterality: N/A;   LAPAROSCOPIC GASTRIC SLEEVE RESECTION N/A 06/29/2012   Procedure: LAPAROSCOPIC GASTRIC SLEEVE RESECTION;  Surgeon: Lodema Pilot, DO;  Location: [facility];  Service: General;  Laterality: N/A;  Laparoscopic Sleeve Gastrectomy with EGD   REDUCTION MAMMAPLASTY Bilateral    VAGINAL HYSTERECTOMY N/A 01/13/2017   Procedure: HYSTERECTOMY VAGINAL;  Surgeon: Reva Bores, MD;  Location: [facility];  Service: Gynecology;  Laterality: N/A;   Social History   Occupational  History   Occupation: Engineer, site  Tobacco Use   Smoking status: Never   Smokeless tobacco: Never  Vaping Use   Vaping Use: Never used  Substance and Sexual Activity   Alcohol use: Yes    Comment: occasional   Drug use: No   Sexual activity: Yes    Birth control/protection: Surgical

## 2022-06-25 ENCOUNTER — Other Ambulatory Visit: Payer: Self-pay | Admitting: Physician Assistant

## 2022-06-26 ENCOUNTER — Encounter: Payer: Self-pay | Admitting: Sports Medicine

## 2022-06-26 ENCOUNTER — Ambulatory Visit (INDEPENDENT_AMBULATORY_CARE_PROVIDER_SITE_OTHER): Payer: 59 | Admitting: Sports Medicine

## 2022-06-26 DIAGNOSIS — M25572 Pain in left ankle and joints of left foot: Secondary | ICD-10-CM | POA: Diagnosis not present

## 2022-06-26 DIAGNOSIS — M76822 Posterior tibial tendinitis, left leg: Secondary | ICD-10-CM | POA: Diagnosis not present

## 2022-06-26 MED ORDER — METHYLPREDNISOLONE 4 MG PO TBPK
ORAL_TABLET | ORAL | 0 refills | Status: DC
Start: 2022-06-26 — End: 2022-09-16

## 2022-06-26 NOTE — Progress Notes (Signed)
Kimberly [name] - 52 y.o. female MRN [id number]  Date of birth: 06/28/1970  Office Visit Note: Visit Date: 06/26/2022 PCP: Pcp, No Referred by: No ref. provider found  Subjective: Chief Complaint  Patient presents with   Left Ankle - Pain, Follow-up   HPI: Kimberly [name] is a pleasant 52 y.o. female who presents today for follow-up of left ankle pain with known posterior tibialis tendinitis.  Kimberly [name] has had 2 sessions of extracorporeal shockwave therapy, she is noting rather good improvement from this.  Feels like after this most recent 1 she is at least 60-70% improved.  She had essentially near full relief of her pain for the first few days after last treatment.  She has been very active and busy with work and doing a lot of walking, activity.  She continues with her home rehab exercises consistently.  Is noticing improvement in strength and less pain.  Pertinent ROS were reviewed with the patient and found to be negative unless otherwise specified above in HPI.   Assessment & Plan: Visit Diagnoses:  1. Left tibialis posterior tendinitis   2. Pain in left ankle and joints of left foot    Plan: Both [name] and I are pleased with the progress she is made from the extracorporeal shockwave therapy and home exercises.  Did repeat these ESWT today.  An attempt to help improve her inflammation and the fluid surrounding the tibialis posterior sheath, we will place her on a 6-day Medrol Dosepak.  She will continue home exercises starting tomorrow, will follow-up next week for additional treatment.  When she is near 100% we will likely take a holiday from shockwave and let her continue her home rehab. Will hold on formal PT for now as she is making good strides.  Follow-up: Return in about 1 week (around 07/03/2022) for for PT-tendinitis.   Meds & Orders:  Meds ordered this encounter  Medications   methylPREDNISolone (MEDROL DOSEPAK) 4 MG TBPK tablet    Sig: Take per packet instructions. Taper  dosing.    Dispense:  1 each    Refill:  0   No orders of the defined types were placed in this encounter.    Procedures: Procedure: ECSWT Indications:  Posterior tibial tendinitis   Procedure Details Consent: Risks of procedure as well as the alternatives and risks of each were explained to the patient.  Verbal consent for procedure obtained. Time Out: Verified patient identification, verified procedure, site was marked, verified correct patient position. The area was cleaned with alcohol swab.     The left posterior tibial tendon was targeted for Extracorporeal shockwave therapy.    Preset: Tendinopathy/Tendinitis Power Level: 10 mJ Frequency: 10Hz  Impulse/cycles: 3300 Head size: Regular   Patient tolerated procedure well without immediate complications      Clinical History: No specialty comments available.  She reports that she has never smoked. She has never used smokeless tobacco. No results for input(s): "HGBA1C", "LABURIC" in the last 8760 hours.  Objective:   Vital Signs: LMP 01/09/2017 (Exact Date)   Physical Exam  Gen: Well-appearing, in no acute distress; non-toxic CV: Regular Rate. Well-perfused. Warm.  Resp: Breathing unlabored on room air; no wheezing. Psych: Fluid speech in conversation; appropriate affect; normal thought process Neuro: Sensation intact throughout. No gross coordination deficits.   Ortho Exam - Left foot/ankle: + Mild TTP over the region of the posterior tibial tendon just distal to the medial malleolus.  There is mild soft tissue swelling here although improved from  previous visits.  No other bony TTP.  There is improved strength with posterior tibial firing.  Bilateral heel raise performed today with appropriate tibial posterior activation.  Imaging: No results found.  Past Medical/Family/Surgical/Social History: Medications & Allergies reviewed per EMR, new medications updated. 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   Migraine without aura 09/13/2010   ANA positive 09/13/2010   Polyarthralgia 09/13/2010   Essential hypertension 01/11/2009   DE QUERVAIN'S TENOSYNOVITIS 01/26/2008   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   Past Medical History:  Diagnosis Date   Anorectal fistula    Anxiety    Arthritis    neck, hips   Chronic headaches    migraines   History of hypertension    since wt loss after gastric sleeve 2014--  no longer issue   Hx of adenomatous colonic polyps 03/28/2022   Hypertension    Irritable bowel syndrome    PONV (postoperative nausea and vomiting)    slow to wake up   Wears glasses    Family History  Problem Relation Age of Onset   Stroke Mother    Diabetes Mother    Uterine cancer Mother 80   Cancer Mother        endometrial   Atrial fibrillation Mother    Colitis Father    Crohn's disease Father    Diabetes Father    Diverticulitis Father    Diabetes Sister    Uterine cancer Sister 20   Cancer Sister        endometrial   Irritable bowel syndrome Sister    Irritable bowel syndrome Brother    Ovarian cancer Paternal Grandmother    Cancer Paternal Grandmother 32       ovarian   Cancer Maternal Uncle        lung   Colon cancer Paternal Uncle    Past Surgical History:  Procedure Laterality Date   BREAST REDUCTION SURGERY  1996   CESAREAN SECTION  2001   COLONOSCOPY  10-18-2010   w/ bx's   ESOPHAGOGASTRODUODENOSCOPY N/A 06/29/2012   Procedure: ESOPHAGOGASTRODUODENOSCOPY (EGD);  Surgeon: Lodema Pilot, DO;  Location: [facility];  Service: General;  Laterality: N/A;   EVALUATION UNDER ANESTHESIA WITH ANAL FISTULECTOMY N/A 04/14/2014   Procedure: ANAL EXAM UNDER ANESTHESIA WITH  FISTULOTOMY;  Surgeon: Romie Levee, MD;  Location: [facility] [place];  Service: General;  Laterality: N/A;   LAPAROSCOPIC BILATERAL  SALPINGECTOMY N/A 06/29/2012   Procedure: LAPAROSCOPIC BILATERAL SALPINGECTOMY;  Surgeon: Reva Bores, MD;  Location: [facility];  Service: Gynecology;  Laterality: N/A;   LAPAROSCOPIC GASTRIC SLEEVE RESECTION N/A 06/29/2012   Procedure: LAPAROSCOPIC GASTRIC SLEEVE RESECTION;  Surgeon: Lodema Pilot, DO;  Location: [facility];  Service: General;  Laterality: N/A;  Laparoscopic Sleeve Gastrectomy with EGD   REDUCTION MAMMAPLASTY Bilateral    VAGINAL HYSTERECTOMY N/A 01/13/2017   Procedure: HYSTERECTOMY VAGINAL;  Surgeon: Reva Bores, MD;  Location: [facility];  Service: Gynecology;  Laterality: N/A;   Social History   Occupational History   Occupation: Engineer, site  Tobacco Use   Smoking status: Never   Smokeless tobacco: Never  Vaping Use   Vaping Use: Never used  Substance and Sexual Activity   Alcohol use: Yes    Comment: occasional   Drug use: No   Sexual activity: Yes    Birth control/protection: Surgical

## 2022-07-02 ENCOUNTER — Ambulatory Visit (INDEPENDENT_AMBULATORY_CARE_PROVIDER_SITE_OTHER): Payer: 59 | Admitting: Sports Medicine

## 2022-07-02 ENCOUNTER — Encounter: Payer: Self-pay | Admitting: Sports Medicine

## 2022-07-02 DIAGNOSIS — M25572 Pain in left ankle and joints of left foot: Secondary | ICD-10-CM

## 2022-07-02 DIAGNOSIS — M76822 Posterior tibial tendinitis, left leg: Secondary | ICD-10-CM | POA: Diagnosis not present

## 2022-07-02 NOTE — Progress Notes (Signed)
States she is doing better; and treatments are helping

## 2022-07-02 NOTE — Progress Notes (Addendum)
Kimberly [name] - 52 y.o. female MRN [id number]  Date of birth: 1970-05-19  Office Visit Note: Visit Date: 07/02/2022 PCP: Pcp, No Referred by: No ref. provider found  Subjective: Chief Complaint  Patient presents with   Left Leg - Follow-up   HPI: Kimberly [name] is a pleasant 52 y.o. female who presents today for  follow-up of left ankle pain with known posterior tibialis tendinitis.   Se has had 3 sessions of ESCWT and finding good improvement. Last visit gave her Medrol dosepak to further improve inflammation and pain, today she is doing better.  She states she actually had a few instances where she was being active and not thinking about the ankle/foot pain.  She has been really busy over the last few days and because of this her posterior tibial tendon has been somewhat painful/sore, although in general has been finding good relief since our treatments.  Continues with her home exercises.  She is leaving to go on vacation to [place] here in 2 weeks.  Pertinent ROS were reviewed with the patient and found to be negative unless otherwise specified above in HPI.   Assessment & Plan: Visit Diagnoses:  1. Left tibialis posterior tendinitis   2. Pain in left ankle and joints of left foot    Plan: Kimberly [name] has made good progress with extracorporeal shockwave therapy as well as home exercises.  She found some relief from the Medrol Dosepak as well - she has completed this.  At this point she is getting back into her routine activity without as much pain.  We did repeat his ESWT today, at this point given her improvement we will have her take a holiday for the next 3 weeks.  She will message or call me to then notify the degree of further improvement she is having.  If she continues to improve, we will allow her to continue her home exercises and follow-up as needed.  I did upgrade her home rehab exercises today to perform single-leg heel raises. If she is having a plateau or reoccurrence of her  pain we will consider repeat shockwave treatments, can always consider ultrasound-guided PT tendon injection as well.  Follow-up: Return for She will call/message me in about 3 weeks to update me on improvement of PT tendon.   Meds & Orders: No orders of the defined types were placed in this encounter.  No orders of the defined types were placed in this encounter.    Procedures: Procedure: ECSWT Indications:  Posterior tibial tendinitis   Procedure Details Consent: Risks of procedure as well as the alternatives and risks of each were explained to the patient.  Verbal consent for procedure obtained. Time Out: Verified patient identification, verified procedure, site was marked, verified correct patient position. The area was cleaned with alcohol swab.     The left posterior tibial tendon was targeted for Extracorporeal shockwave therapy.    Preset: Tendinopathy/Tendinitis Power Level: 100-110 mJ Frequency: 11 Hz Impulse/cycles: 3200 Head size: Regular   Patient tolerated procedure well without immediate complications       Clinical History: No specialty comments available.  She reports that she has never smoked. She has never used smokeless tobacco. No results for input(s): "HGBA1C", "LABURIC" in the last 8760 hours.  Objective:   Vital Signs: LMP 01/09/2017 (Exact Date)   Physical Exam  Gen: Well-appearing, in no acute distress; non-toxic CV:  Well-perfused. Warm.  Resp: Breathing unlabored on room air; no wheezing. Psych: Fluid speech in conversation;  appropriate affect; normal thought process Neuro: Sensation intact throughout. No gross coordination deficits.   Ortho Exam - Left foot/ankle: + Mild TTP over the region of the posterior tibial tendon just distal to the medial malleolus.  No other bony TTP.  There is improved strength with posterior tibial firing.  Bilateral heel raise performed today with posterior tibialis activation, although not quite as strong as  contralateral side.  Imaging: No results found.  Past Medical/Family/Surgical/Social History: Medications & Allergies reviewed per EMR, new medications updated. 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   Migraine without aura 09/13/2010   ANA positive 09/13/2010   Polyarthralgia 09/13/2010   Essential hypertension 01/11/2009   DE QUERVAIN'S TENOSYNOVITIS 01/26/2008   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   Past Medical History:  Diagnosis Date   Anorectal fistula    Anxiety    Arthritis    neck, hips   Chronic headaches    migraines   History of hypertension    since wt loss after gastric sleeve 2014--  no longer issue   Hx of adenomatous colonic polyps 03/28/2022   Hypertension    Irritable bowel syndrome    PONV (postoperative nausea and vomiting)    slow to wake up   Wears glasses    Family History  Problem Relation Age of Onset   Stroke Mother    Diabetes Mother    Uterine cancer Mother 26   Cancer Mother        endometrial   Atrial fibrillation Mother    Colitis Father    Crohn's disease Father    Diabetes Father    Diverticulitis Father    Diabetes Sister    Uterine cancer Sister 32   Cancer Sister        endometrial   Irritable bowel syndrome Sister    Irritable bowel syndrome Brother    Ovarian cancer Paternal Grandmother    Cancer Paternal Grandmother 64       ovarian   Cancer Maternal Uncle        lung   Colon cancer Paternal Uncle    Past Surgical History:  Procedure Laterality Date   BREAST REDUCTION SURGERY  1996   CESAREAN SECTION  2001   COLONOSCOPY  10-18-2010   w/ bx's   ESOPHAGOGASTRODUODENOSCOPY N/A 06/29/2012   Procedure: ESOPHAGOGASTRODUODENOSCOPY (EGD);  Surgeon: Lodema Pilot, DO;  Location: [facility];  Service: General;  Laterality: N/A;   EVALUATION UNDER ANESTHESIA WITH  ANAL FISTULECTOMY N/A 04/14/2014   Procedure: ANAL EXAM UNDER ANESTHESIA WITH  FISTULOTOMY;  Surgeon: Romie Levee, MD;  Location: [facility] [place];  Service: General;  Laterality: N/A;   LAPAROSCOPIC BILATERAL SALPINGECTOMY N/A 06/29/2012   Procedure: LAPAROSCOPIC BILATERAL SALPINGECTOMY;  Surgeon: Reva Bores, MD;  Location: [facility];  Service: Gynecology;  Laterality: N/A;   LAPAROSCOPIC GASTRIC SLEEVE RESECTION N/A 06/29/2012   Procedure: LAPAROSCOPIC GASTRIC SLEEVE RESECTION;  Surgeon: Lodema Pilot, DO;  Location: [facility];  Service: General;  Laterality: N/A;  Laparoscopic Sleeve Gastrectomy with EGD   REDUCTION MAMMAPLASTY Bilateral    VAGINAL HYSTERECTOMY N/A 01/13/2017   Procedure: HYSTERECTOMY VAGINAL;  Surgeon: Reva Bores, MD;  Location: [facility];  Service: Gynecology;  Laterality: N/A;   Social History   Occupational History   Occupation: Engineer, site  Tobacco Use   Smoking status: Never   Smokeless  tobacco: Never  Vaping Use   Vaping Use: Never used  Substance and Sexual Activity   Alcohol use: Yes    Comment: occasional   Drug use: No   Sexual activity: Yes    Birth control/protection: Surgical

## 2022-07-25 ENCOUNTER — Encounter: Payer: Self-pay | Admitting: Sports Medicine

## 2022-08-12 ENCOUNTER — Other Ambulatory Visit: Payer: Self-pay

## 2022-08-12 ENCOUNTER — Ambulatory Visit (INDEPENDENT_AMBULATORY_CARE_PROVIDER_SITE_OTHER): Payer: BC Managed Care – PPO | Admitting: Sports Medicine

## 2022-08-12 DIAGNOSIS — M76822 Posterior tibial tendinitis, left leg: Secondary | ICD-10-CM

## 2022-08-12 DIAGNOSIS — M722 Plantar fascial fibromatosis: Secondary | ICD-10-CM

## 2022-08-12 DIAGNOSIS — M25572 Pain in left ankle and joints of left foot: Secondary | ICD-10-CM

## 2022-08-12 NOTE — Progress Notes (Unsigned)
Kimberly [name] - 52 y.o. female MRN [id number]  Date of birth: 03-Sep-1970  Office Visit Note: Visit Date: 08/12/2022 PCP: Pcp, No Referred by: No ref. provider found  Subjective: No chief complaint on file.  HPI: Kimberly [name] is a pleasant 52 y.o. female who presents today for follow-up and evaluation of left ankle/PT tendon pain.  ***  Pertinent ROS were reviewed with the patient and found to be negative unless otherwise specified above in HPI.   Assessment & Plan: Visit Diagnoses: No diagnosis found.  Plan: ***  Follow-up: No follow-ups on file.   Meds & Orders: No orders of the defined types were placed in this encounter.  No orders of the defined types were placed in this encounter.    Procedures: US-guided Posterior Tibial Tendon Sheath Injection, left ankle: After discussion on risk/benefits/indications, an informed verbal consent was obtained. A timeout was then performed. The patient was lying supine on exam table with affected medial ankle facing upward and leg in frog-leg position with bolster under ankle.  he area overlying the posterior tibial tendon was prepped with ChloraPrep and multiple alcohol swabs.  The ultrasound probe was placed in a short-axis plane just posterior to the medial malleolus where the posterior tibial tendon was identified.  Using ultrasound guidance via an in-plane approach, a 25-gauge, 1.5" needle was inserted from a posterior to anterior direction into the posterior tibial tendon sheath with subsequent injection of 1:1:1cc of lidocaine:bupivicaine:Celestone. Appropriate spread of the injectate within the tendon sheath was visualized with ultrasound guidance. Patient tolerated the procedure well without immediate complications.  A Band-Aid was then applied.        Clinical History: No specialty comments available.  She reports that she has never smoked. She has never used smokeless tobacco. No results for input(s): "HGBA1C", "LABURIC" in the  last 8760 hours.  Objective:   Vital Signs: LMP 01/09/2017 (Exact Date)   Physical Exam  Gen: Well-appearing, in no acute distress; non-toxic CV: Regular Rate. Well-perfused. Warm.  Resp: Breathing unlabored on room air; no wheezing. Psych: Fluid speech in conversation; appropriate affect; normal thought process Neuro: Sensation intact throughout. No gross coordination deficits.   Ortho Exam - ***  Imaging: No results found.  Past Medical/Family/Surgical/Social History: Medications & Allergies reviewed per EMR, new medications updated. Patient Active Problem List   Diagnosis Date Noted  . Hx of adenomatous colonic polyps 03/28/2022  . Muscle spasm 11/13/2017  . 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 Migraine without aura 09/13/2010  . ANA positive 09/13/2010  . Polyarthralgia 09/13/2010  . Essential hypertension 01/11/2009  . 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   Past Medical History:  Diagnosis Date  . Anorectal fistula   . Anxiety   . Arthritis    neck, hips  . Chronic headaches    migraines  . 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 Hx of adenomatous colonic polyps 03/28/2022  . Hypertension   . Irritable bowel syndrome   . PONV (postoperative nausea and vomiting)    slow to wake up  . Wears glasses    Family History  Problem Relation Age of Onset  . Stroke Mother   . Diabetes Mother   . Uterine cancer Mother 45  . Cancer Mother        endometrial  . Atrial fibrillation Mother   . Colitis Father   .  Crohn's disease Father   . Diabetes Father   . Diverticulitis Father   . Diabetes Sister   . Uterine cancer Sister 76  . Cancer Sister        endometrial  . Irritable bowel syndrome Sister   . Irritable bowel syndrome Brother   . Ovarian cancer Paternal Grandmother   . Cancer Paternal Grandmother 77       ovarian  .  Cancer Maternal Uncle        lung  . Colon cancer Paternal Uncle    Past Surgical History:  Procedure Laterality Date  . BREAST REDUCTION SURGERY  1996  . CESAREAN SECTION  2001  . COLONOSCOPY  10-18-2010   w/ bx's  . ESOPHAGOGASTRODUODENOSCOPY N/A 06/29/2012   Procedure: ESOPHAGOGASTRODUODENOSCOPY (EGD);  Surgeon: Lodema Pilot, DO;  Location: [facility];  Service: General;  Laterality: N/A;  . EVALUATION UNDER ANESTHESIA WITH ANAL FISTULECTOMY N/A 04/14/2014   Procedure: ANAL EXAM UNDER ANESTHESIA WITH  FISTULOTOMY;  Surgeon: Romie Levee, MD;  Location: [facility];  Service: General;  Laterality: N/A;  . LAPAROSCOPIC BILATERAL SALPINGECTOMY N/A 06/29/2012   Procedure: LAPAROSCOPIC BILATERAL SALPINGECTOMY;  Surgeon: Reva Bores, MD;  Location: [facility];  Service: Gynecology;  Laterality: N/A;  . LAPAROSCOPIC GASTRIC SLEEVE RESECTION N/A 06/29/2012   Procedure: LAPAROSCOPIC GASTRIC SLEEVE RESECTION;  Surgeon: Lodema Pilot, DO;  Location: [facility];  Service: General;  Laterality: N/A;  Laparoscopic Sleeve Gastrectomy with EGD  . REDUCTION MAMMAPLASTY Bilateral   . VAGINAL HYSTERECTOMY N/A 01/13/2017   Procedure: HYSTERECTOMY VAGINAL;  Surgeon: Reva Bores, MD;  Location: [facility];  Service: Gynecology;  Laterality: N/A;   Social History   Occupational History  . Occupation: Engineer, site  Tobacco Use  . Smoking status: Never  . Smokeless tobacco: Never  Vaping Use  . Vaping status: Never Used  Substance and Sexual Activity  . Alcohol use: Yes    Comment: occasional  . Drug use: No  . Sexual activity: Yes    Birth control/protection: Surgical

## 2022-08-19 ENCOUNTER — Encounter: Payer: Self-pay | Admitting: Sports Medicine

## 2022-08-22 ENCOUNTER — Encounter: Payer: Self-pay | Admitting: *Deleted

## 2022-08-25 ENCOUNTER — Other Ambulatory Visit: Payer: Self-pay | Admitting: *Deleted

## 2022-08-25 ENCOUNTER — Encounter: Payer: Self-pay | Admitting: *Deleted

## 2022-08-25 MED ORDER — UBRELVY 100 MG PO TABS: 1.0000 | ORAL_TABLET | ORAL | 11 refills | Status: DC | PRN

## 2022-08-27 ENCOUNTER — Other Ambulatory Visit: Payer: Self-pay | Admitting: Sports Medicine

## 2022-08-27 DIAGNOSIS — M25572 Pain in left ankle and joints of left foot: Secondary | ICD-10-CM

## 2022-08-27 DIAGNOSIS — M76822 Posterior tibial tendinitis, left leg: Secondary | ICD-10-CM

## 2022-09-05 NOTE — Therapy (Addendum)
OUTPATIENT PHYSICAL THERAPY  EVALUATION   Patient Name: Kimberly [name] MRN: [id number] DOB:06-01-1970, 52 y.o., female Today's Date: 09/09/2022  END OF SESSION:  PT End of Session - 09/09/22 1509     Visit Number 1    Number of Visits 20    Date for PT Re-Evaluation 11/18/22    Authorization Type [employer] 30% coinsurance, 30 visits    Progress Note Due on Visit 10    PT Start Time 1512    PT Stop Time 1548    PT Time Calculation (min) 36 min    Activity Tolerance Patient tolerated treatment well    Behavior During Therapy WFL for tasks assessed/performed             Past Medical History:  Diagnosis Date   Anorectal fistula    Anxiety    Arthritis    neck, hips   Chronic headaches    migraines   History of hypertension    since wt loss after gastric sleeve 2014--  no longer issue   Hx of adenomatous colonic polyps 03/28/2022   Hypertension    Irritable bowel syndrome    PONV (postoperative nausea and vomiting)    slow to wake up   Wears glasses    Past Surgical History:  Procedure Laterality Date   BREAST REDUCTION SURGERY  1996   CESAREAN SECTION  2001   COLONOSCOPY  10-18-2010   w/ bx's   ESOPHAGOGASTRODUODENOSCOPY N/A 06/29/2012   Procedure: ESOPHAGOGASTRODUODENOSCOPY (EGD);  Surgeon: Lodema Pilot, DO;  Location: [facility];  Service: General;  Laterality: N/A;   EVALUATION UNDER ANESTHESIA WITH ANAL FISTULECTOMY N/A 04/14/2014   Procedure: ANAL EXAM UNDER ANESTHESIA WITH  FISTULOTOMY;  Surgeon: Romie Levee, MD;  Location: [facility];  Service: General;  Laterality: N/A;   LAPAROSCOPIC BILATERAL SALPINGECTOMY N/A 06/29/2012   Procedure: LAPAROSCOPIC BILATERAL SALPINGECTOMY;  Surgeon: Reva Bores, MD;  Location: [facility];  Service: Gynecology;  Laterality: N/A;   LAPAROSCOPIC GASTRIC SLEEVE RESECTION N/A 06/29/2012   Procedure: LAPAROSCOPIC GASTRIC SLEEVE RESECTION;  Surgeon: Lodema Pilot, DO;  Location: [facility];  Service: General;  Laterality: N/A;   Laparoscopic Sleeve Gastrectomy with EGD   REDUCTION MAMMAPLASTY Bilateral    VAGINAL HYSTERECTOMY N/A 01/13/2017   Procedure: HYSTERECTOMY VAGINAL;  Surgeon: Reva Bores, MD;  Location: [facility];  Service: Gynecology;  Laterality: N/A;   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   Migraine without aura 09/13/2010   ANA positive 09/13/2010   Polyarthralgia 09/13/2010   Essential hypertension 01/11/2009   DE QUERVAIN'S TENOSYNOVITIS 01/26/2008   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    PCP: none listed in epic  REFERRING PROVIDER: Madelyn Brunner, DO  REFERRING DIAG: [phone] (ICD-10-CM) - Pain in left ankle and joints of left foot M76.822 (ICD-10-CM) - Left tibialis posterior tendinitis Madelyn Brunner, DO  THERAPY DIAG:  Pain in left ankle and joints of left foot  Muscle weakness (generalized)  Difficulty in walking, not elsewhere classified  Localized edema  Rationale for Evaluation and Treatment: Rehabilitation  ONSET DATE:  April 2024  SUBJECTIVE:   SUBJECTIVE STATEMENT: Pt returned to clinic c referral for Lt ankle/foot pain.  Pt indicated complaints in medial aspect of Lt foot.  Felt pain start in Zumba class.  Pt indicated having some improvement then went on vacation and had trouble walking  due to symptoms.   Reported swelling and burning with ache up to medial shin with increased WB activity.   Had shockwave therapy prior to vacation.   Reported waking due to symptoms as well.   Wore boot for period of time after initial symptoms as well as a injection after vacation.   PERTINENT HISTORY: Seen in 2023 for complaints in Lt ankle following pop in ankle/foot.    History of various ankle sprains in past. HTN, IBS, histor of migraines   PAIN:  NPRS scale: current 4/10, at worst in last few weeks 8/10 Pain  location: Lt ankle/foot Pain description: burning, sharp.  Constant discomfort noted.  Aggravating factors: walking, standing  Relieving factors: shockwave, ice, rest  PRECAUTIONS: None  WEIGHT BEARING RESTRICTIONS: no  FALLS:  Has patient fallen in last 6 months? 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  OBJECTIVE:   PATIENT SURVEYS:  09/09/2022 FOTO intake: 47   predicted:  56  COGNITION: 09/09/2022 Overall cognitive status: WFL    SENSATION: 09/09/2022 WFL  EDEMA:  09/09/2022 19 inch Rt, 19.25 inch Lt.   MUSCLE LENGTH: 09/09/2022 No checks today  POSTURE:  09/09/2022 No Significant postural limitations  PALPATION: 09/09/2022 Tenderness along posterior tib tendon near medial malleolus, tenderness with trigger points noted in muscle belly as well Lt .   LOWER EXTREMITY ROM:   ROM Right 09/09/2022 Left 09/09/2022  Hip flexion    Hip extension    Hip abduction    Hip adduction    Hip internal rotation    Hip external rotation    Knee flexion    Knee extension    Ankle dorsiflexion 18 in 90 deg knee flexion 15  in 90 deg knee flexion  Ankle plantarflexion    Ankle inversion    Ankle eversion     (Blank rows = not tested)  LOWER EXTREMITY MMT:  MMT Right 09/09/2022 Left 09/09/2022  Hip flexion    Hip extension    Hip abduction    Hip adduction    Hip internal rotation    Hip external rotation    Knee flexion    Knee extension    Ankle dorsiflexion 5/5 5/5  Ankle plantarflexion 5/5 (20 reps PF in single leg) 3+/5 c pain noted   Ankle inversion 5/5 4/5 c pain  Ankle eversion 5/5 5/5   (Blank rows = not tested)  LOWER EXTREMITY SPECIAL TESTS:  09/09/2022 No specific testing today  FUNCTIONAL TESTS:  09/09/2022 Lt  SLS: 30 seconds with pain noted Rt SLS: 30 seconds  GAIT: 09/09/2022 Independent ambulation with weight deviation in stance with decreased toe off progression.  TODAY'S TREATMENT                                                                          DATE:09/09/2022 Therex:    HEP instruction/performance c cues for techniques, handout provided.  Trial set performed of each for comprehension and symptom assessment.  See below for exercise list  Ionto Dexamethasone 1. 0 dosage 6 hr patch at home.   PATIENT EDUCATION:  09/09/2022 Education details: HEP, POC Person educated: Patient Education method: Programmer, multimedia, Demonstration, Verbal cues, and Handouts Education comprehension: verbalized understanding, returned demonstration, and verbal cues required  HOME EXERCISE PROGRAM: Access Code: [email or web address] URL: [email or web address] Date: 09/09/2022 Prepared by: Chyrel Masson  Exercises - Long Sitting Ankle Eversion with Resistance  - 2 x daily - 7 x weekly - 2-3 sets - 10-15 reps - Long Sitting Ankle Inversion with Resistance  - 2 x daily - 7 x weekly - 2-3 sets - 10-15 reps - Long Sitting Ankle Dorsiflexion with Anchored Resistance  - 2 x daily - 7 x weekly - 2-3 sets - 10-15 reps  ASSESSMENT:  CLINICAL IMPRESSION: Patient is a 52 y.o. who comes to clinic with complaints of Lt foot/ankle pain with mobility, strength and movement coordination deficits that impair their ability to perform usual daily and recreational functional activities without increase difficulty/symptoms at this time.  Patient to benefit from skilled PT services to address impairments and limitations to improve to previous level of function without restriction secondary to condition.   OBJECTIVE IMPAIRMENTS: Abnormal gait, decreased activity  tolerance, decreased balance, decreased coordination, decreased endurance, decreased mobility, difficulty walking, decreased ROM, decreased strength, hypomobility, increased edema, increased fascial restrictions, impaired perceived functional ability, impaired flexibility, improper body mechanics, and pain.   ACTIVITY LIMITATIONS: carrying, lifting, bending, standing, squatting, stairs, transfers, and locomotion level  PARTICIPATION LIMITATIONS: cleaning, laundry, interpersonal relationship, driving, shopping, community activity, and occupation  PERSONAL FACTORS: Time since onset of injury/illness/exacerbation are also affecting patient's functional outcome.   REHAB POTENTIAL: Good  CLINICAL DECISION MAKING: Stable/uncomplicated  EVALUATION COMPLEXITY: Low   GOALS: Goals reviewed with patient? Yes  SHORT TERM GOALS: (target date for Short term goals are 3 weeks 09/30/2022)   1.  Patient will demonstrate independent use of home exercise program to maintain progress from in clinic treatments.  Goal status: New  LONG TERM GOALS: (target dates for all long term goals are 10 weeks  11/18/2022 )   1. Patient will demonstrate/report pain at worst less than or equal to 2/10 to facilitate minimal limitation in daily activity secondary to pain symptoms.  Goal status: New   2. Patient will demonstrate independent use of home exercise program to facilitate ability to maintain/progress functional gains from skilled physical therapy services.  Goal status: New   3. Patient will demonstrate FOTO outcome > or = 56 % to indicate reduced disability due to condition.  Goal status: New   4.  Patient will demonstrate Lt LE MMT 5/5 throughout to faciltiate usual transfers, stairs, squatting at [facility] for daily life.   Goal status: New   5.  Patient will demonstrate ability to perform usual work/housework at [facility] s limitation.   Goal status: New   6.  Patient will demonstrate bilateral SLS  s  symptoms 30 seconds.  Goal status: New     PLAN:  PT FREQUENCY: 1-2x/week  PT DURATION: 10 weeks  PLANNED INTERVENTIONS: Therapeutic exercises, Therapeutic activity, Neuro Muscular re-education, Balance training, Gait training, Patient/Family education, Joint mobilization, Stair training, DME instructions, Dry Needling, Electrical stimulation, Traction, Cryotherapy, vasopneumatic deviceMoist heat, Taping, Ultrasound, Ionotophoresis 4mg /ml Dexamethasone, and aquatic therapy, Manual therapy.  All included unless contraindicated  PLAN FOR NEXT SESSION: Review HEP knowledge/results. Ionto if helpful .   Chyrel Masson, PT, DPT, OCS, ATC 09/09/22  4:33 PM

## 2022-09-09 ENCOUNTER — Encounter: Payer: Self-pay | Admitting: Rehabilitative and Restorative Service Providers"

## 2022-09-09 ENCOUNTER — Ambulatory Visit (INDEPENDENT_AMBULATORY_CARE_PROVIDER_SITE_OTHER): Payer: BC Managed Care – PPO | Admitting: Rehabilitative and Restorative Service Providers"

## 2022-09-09 ENCOUNTER — Other Ambulatory Visit: Payer: Self-pay

## 2022-09-09 DIAGNOSIS — R262 Difficulty in walking, not elsewhere classified: Secondary | ICD-10-CM | POA: Diagnosis not present

## 2022-09-09 DIAGNOSIS — M6281 Muscle weakness (generalized): Secondary | ICD-10-CM

## 2022-09-09 DIAGNOSIS — M25572 Pain in left ankle and joints of left foot: Secondary | ICD-10-CM | POA: Diagnosis not present

## 2022-09-09 DIAGNOSIS — R600 Localized edema: Secondary | ICD-10-CM | POA: Diagnosis not present

## 2022-09-11 ENCOUNTER — Encounter: Payer: Self-pay | Admitting: Physical Therapy

## 2022-09-11 ENCOUNTER — Ambulatory Visit (INDEPENDENT_AMBULATORY_CARE_PROVIDER_SITE_OTHER): Payer: BC Managed Care – PPO | Admitting: Physical Therapy

## 2022-09-11 DIAGNOSIS — R262 Difficulty in walking, not elsewhere classified: Secondary | ICD-10-CM

## 2022-09-11 DIAGNOSIS — R600 Localized edema: Secondary | ICD-10-CM | POA: Diagnosis not present

## 2022-09-11 DIAGNOSIS — M6281 Muscle weakness (generalized): Secondary | ICD-10-CM

## 2022-09-11 DIAGNOSIS — M25572 Pain in left ankle and joints of left foot: Secondary | ICD-10-CM

## 2022-09-11 NOTE — Therapy (Signed)
OUTPATIENT PHYSICAL THERAPY  EVALUATION   Patient Name: SAKSHI GOODBAR MRN: [id number] DOB:02-03-1970, 52 y.o., female Today's Date: 09/11/2022  END OF SESSION:  PT End of Session - 09/11/22 1351     Visit Number 2    Number of Visits 20    Date for PT Re-Evaluation 11/18/22    Authorization Type [employer] 30% coinsurance, 30 visits    Progress Note Due on Visit 10    PT Start Time 1345    PT Stop Time 1425    PT Time Calculation (min) 40 min    Activity Tolerance Patient tolerated treatment well    Behavior During Therapy WFL for tasks assessed/performed             Past Medical History:  Diagnosis Date   Anorectal fistula    Anxiety    Arthritis    neck, hips   Chronic headaches    migraines   History of hypertension    since wt loss after gastric sleeve 2014--  no longer issue   Hx of adenomatous colonic polyps 03/28/2022   Hypertension    Irritable bowel syndrome    PONV (postoperative nausea and vomiting)    slow to wake up   Wears glasses    Past Surgical History:  Procedure Laterality Date   BREAST REDUCTION SURGERY  1996   CESAREAN SECTION  2001   COLONOSCOPY  10-18-2010   w/ bx's   ESOPHAGOGASTRODUODENOSCOPY N/A 06/29/2012   Procedure: ESOPHAGOGASTRODUODENOSCOPY (EGD);  Surgeon: Lodema Pilot, DO;  Location: [facility];  Service: General;  Laterality: N/A;   EVALUATION UNDER ANESTHESIA WITH ANAL FISTULECTOMY N/A 04/14/2014   Procedure: ANAL EXAM UNDER ANESTHESIA WITH  FISTULOTOMY;  Surgeon: Romie Levee, MD;  Location: [facility];  Service: General;  Laterality: N/A;   LAPAROSCOPIC BILATERAL SALPINGECTOMY N/A 06/29/2012   Procedure: LAPAROSCOPIC BILATERAL SALPINGECTOMY;  Surgeon: Reva Bores, MD;  Location: [facility];  Service: Gynecology;  Laterality: N/A;   LAPAROSCOPIC GASTRIC SLEEVE RESECTION N/A 06/29/2012   Procedure: LAPAROSCOPIC GASTRIC SLEEVE RESECTION;  Surgeon: Lodema Pilot, DO;  Location: [facility];  Service: General;  Laterality: N/A;   Laparoscopic Sleeve Gastrectomy with EGD   REDUCTION MAMMAPLASTY Bilateral    VAGINAL HYSTERECTOMY N/A 01/13/2017   Procedure: HYSTERECTOMY VAGINAL;  Surgeon: Reva Bores, MD;  Location: [facility];  Service: Gynecology;  Laterality: N/A;   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   Migraine without aura 09/13/2010   ANA positive 09/13/2010   Polyarthralgia 09/13/2010   Essential hypertension 01/11/2009   DE QUERVAIN'S TENOSYNOVITIS 01/26/2008   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    PCP: none listed in epic  REFERRING PROVIDER: Madelyn Brunner, DO  REFERRING DIAG: [phone] (ICD-10-CM) - Pain in left ankle and joints of left foot M76.822 (ICD-10-CM) - Left tibialis posterior tendinitis Madelyn Brunner, DO  THERAPY DIAG:  Pain in left ankle and joints of left foot  Muscle weakness (generalized)  Difficulty in walking, not elsewhere classified  Localized edema  Rationale for Evaluation and Treatment: Rehabilitation  ONSET DATE:  April 2024  SUBJECTIVE:   SUBJECTIVE STATEMENT: She states some soreness from the exercises  PERTINENT HISTORY: Seen in 2023 for complaints in Lt ankle following pop in ankle/foot.    History of various ankle sprains in past. HTN, IBS, histor of migraines   PAIN:  NPRS scale: current 5/10 Pain location: Lt ankle/foot Pain description: burning, sharp.  Constant discomfort noted.  Aggravating factors: walking, standing  Relieving factors: shockwave, ice, rest  PRECAUTIONS: None  WEIGHT BEARING RESTRICTIONS: no  FALLS:  Has patient fallen in last 6 months? 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  OBJECTIVE:   PATIENT SURVEYS:  09/09/2022 FOTO intake: 47   predicted:  56  COGNITION: 09/09/2022 Overall cognitive status: WFL    SENSATION: 09/09/2022 WFL  EDEMA:  09/09/2022 19 inch Rt, 19.25 inch Lt.   MUSCLE LENGTH: 09/09/2022 No checks today  POSTURE:  09/09/2022 No Significant postural limitations  PALPATION: 09/09/2022 Tenderness along posterior tib tendon near medial malleolus, tenderness with trigger points noted in muscle belly as well Lt .   LOWER EXTREMITY ROM:   ROM Right 09/09/2022 Left 09/09/2022  Hip flexion    Hip extension    Hip abduction    Hip adduction    Hip internal rotation    Hip external rotation    Knee flexion    Knee extension    Ankle dorsiflexion 18 in 90 deg knee flexion 15  in 90 deg knee flexion  Ankle plantarflexion    Ankle inversion    Ankle eversion     (Blank rows = not tested)  LOWER EXTREMITY MMT:  MMT Right 09/09/2022 Left 09/09/2022  Hip flexion    Hip extension    Hip abduction    Hip adduction    Hip internal rotation    Hip external rotation    Knee flexion    Knee extension    Ankle dorsiflexion 5/5 5/5  Ankle plantarflexion 5/5 (20 reps PF in single leg) 3+/5 c pain noted   Ankle inversion 5/5 4/5 c pain  Ankle eversion 5/5 5/5   (Blank rows = not tested)  LOWER EXTREMITY SPECIAL TESTS:  09/09/2022 No specific testing today  FUNCTIONAL TESTS:  09/09/2022 Lt SLS: 30 seconds with pain noted Rt SLS: 30 seconds  GAIT: 09/09/2022 Independent ambulation with weight deviation in stance with decreased toe off progression.                                                                                                                                                                         TODAY'S TREATMENT  DATE:09/11/2022 Nu step L6 X 8 min UE/LE Slantboard stretch 30 sec X 3  bilat Heel and toe raises 2X10 Tandem balance 30 sec X 3 bilat Sidestepping on foam beam 5 round trips Forward/backward tandem walking on foam beam 5 round trips Ankle 4 way red 2X10 each way  Manual therapy for active compression and skilled palpation and Trigger Point Dry-Needling  Treatment instructions: Expect mild to moderate muscle soreness. Patient Consent Given: Yes Education handout provided: verbally provided Muscles treated: tibialis posterior, gastroc, quadratus plantae/flexor digitorum Treatment response/outcome: fair overall tolerance,twitch response noted      DATE:09/09/2022 Therex:    HEP instruction/performance c cues for techniques, handout provided.  Trial set performed of each for comprehension and symptom assessment.  See below for exercise list  PATIENT EDUCATION:  09/09/2022 Education details: HEP, POC Person educated: Patient Education method: Explanation, Demonstration, Verbal cues, and Handouts Education comprehension: verbalized understanding, returned demonstration, and verbal cues required  HOME EXERCISE PROGRAM: Access Code: [email or web address] URL: [email or web address] Date: 09/09/2022 Prepared by: Chyrel Masson  Exercises - Long Sitting Ankle Eversion with Resistance  - 2 x daily - 7 x weekly - 2-3 sets - 10-15 reps - Long Sitting Ankle Inversion with Resistance  - 2 x daily - 7 x weekly - 2-3 sets - 10-15 reps - Long Sitting Ankle Dorsiflexion with Anchored Resistance  - 2 x daily - 7 x weekly - 2-3 sets - 10-15 reps  ASSESSMENT:  CLINICAL IMPRESSION: She had good tolerance to exercise progression today. She relays she did not notice any improvement from ionto previously so I did try DN instead today. She had fair overall tolerance to this and we will assess what her benefit was next time.   OBJECTIVE IMPAIRMENTS: Abnormal gait, decreased activity tolerance, decreased balance, decreased coordination, decreased endurance, decreased  mobility, difficulty walking, decreased ROM, decreased strength, hypomobility, increased edema, increased fascial restrictions, impaired perceived functional ability, impaired flexibility, improper body mechanics, and pain.   ACTIVITY LIMITATIONS: carrying, lifting, bending, standing, squatting, stairs, transfers, and locomotion level  PARTICIPATION LIMITATIONS: cleaning, laundry, interpersonal relationship, driving, shopping, community activity, and occupation  PERSONAL FACTORS: Time since onset of injury/illness/exacerbation are also affecting patient's functional outcome.   REHAB POTENTIAL: Good  CLINICAL DECISION MAKING: Stable/uncomplicated  EVALUATION COMPLEXITY: Low   GOALS: Goals reviewed with patient? Yes  SHORT TERM GOALS: (target date for Short term goals are 3 weeks 09/30/2022)   1.  Patient will demonstrate independent use of home exercise program to maintain progress from in clinic treatments.  Goal status: New  LONG TERM GOALS: (target dates for all long term goals are 10 weeks  11/18/2022 )   1. Patient will demonstrate/report pain at worst less than or equal to 2/10 to facilitate minimal limitation in daily activity secondary to pain symptoms.  Goal status: New   2. Patient will demonstrate independent use of home exercise program to facilitate ability to maintain/progress functional gains from skilled physical therapy services.  Goal status: New   3. Patient will demonstrate FOTO outcome > or = 56 % to indicate reduced disability due to condition.  Goal status: New   4.  Patient will demonstrate Lt LE MMT 5/5 throughout to faciltiate usual transfers, stairs, squatting at [facility] for daily life.   Goal status: New   5.  Patient will demonstrate ability to perform usual work/housework at [facility] s limitation.   Goal status: New   6.  Patient will demonstrate bilateral SLS s symptoms 30 seconds.  Goal status:  New     PLAN:  PT FREQUENCY: 1-2x/week  PT  DURATION: 10 weeks  PLANNED INTERVENTIONS: Therapeutic exercises, Therapeutic activity, Neuro Muscular re-education, Balance training, Gait training, Patient/Family education, Joint mobilization, Stair training, DME instructions, Dry Needling, Electrical stimulation, Traction, Cryotherapy, vasopneumatic deviceMoist heat, Taping, Ultrasound, Ionotophoresis 4mg /ml Dexamethasone, and aquatic therapy, Manual therapy.  All included unless contraindicated  PLAN FOR NEXT SESSION: how was DN and repeat if helpful. Strength and balance as tolerated.   Ivery Quale, PT, DPT 09/11/22 1:52 PM

## 2022-09-16 ENCOUNTER — Ambulatory Visit (INDEPENDENT_AMBULATORY_CARE_PROVIDER_SITE_OTHER): Payer: BC Managed Care – PPO | Admitting: Rehabilitative and Restorative Service Providers"

## 2022-09-16 ENCOUNTER — Ambulatory Visit (INDEPENDENT_AMBULATORY_CARE_PROVIDER_SITE_OTHER): Payer: BC Managed Care – PPO | Admitting: Sports Medicine

## 2022-09-16 ENCOUNTER — Encounter: Payer: Self-pay | Admitting: Rehabilitative and Restorative Service Providers"

## 2022-09-16 ENCOUNTER — Encounter: Payer: Self-pay | Admitting: Sports Medicine

## 2022-09-16 DIAGNOSIS — R600 Localized edema: Secondary | ICD-10-CM | POA: Diagnosis not present

## 2022-09-16 DIAGNOSIS — R262 Difficulty in walking, not elsewhere classified: Secondary | ICD-10-CM

## 2022-09-16 DIAGNOSIS — M25373 Other instability, unspecified ankle: Secondary | ICD-10-CM | POA: Diagnosis not present

## 2022-09-16 DIAGNOSIS — M25572 Pain in left ankle and joints of left foot: Secondary | ICD-10-CM

## 2022-09-16 DIAGNOSIS — M357 Hypermobility syndrome: Secondary | ICD-10-CM

## 2022-09-16 DIAGNOSIS — M76822 Posterior tibial tendinitis, left leg: Secondary | ICD-10-CM

## 2022-09-16 DIAGNOSIS — M6281 Muscle weakness (generalized): Secondary | ICD-10-CM | POA: Diagnosis not present

## 2022-09-16 MED ORDER — METHYLPREDNISOLONE 4 MG PO TBPK: ORAL_TABLET | ORAL | 0 refills | Status: DC

## 2022-09-16 NOTE — Therapy (Signed)
OUTPATIENT PHYSICAL THERAPY  TREATMENT   Patient Name: Kimberly [name] MRN: [id number] DOB:02/10/1970, 52 y.o., female Today's Date: 09/16/2022  END OF SESSION:  PT End of Session - 09/16/22 1339     Visit Number 3    Number of Visits 20    Date for PT Re-Evaluation 11/18/22    Authorization Type [employer] 30% coinsurance, 30 visits    Progress Note Due on Visit 10    PT Start Time 1343    PT Stop Time 1422    PT Time Calculation (min) 39 min    Activity Tolerance Patient tolerated treatment well    Behavior During Therapy WFL for tasks assessed/performed              Past Medical History:  Diagnosis Date   Anorectal fistula    Anxiety    Arthritis    neck, hips   Chronic headaches    migraines   History of hypertension    since wt loss after gastric sleeve 2014--  no longer issue   Hx of adenomatous colonic polyps 03/28/2022   Hypertension    Irritable bowel syndrome    PONV (postoperative nausea and vomiting)    slow to wake up   Wears glasses    Past Surgical History:  Procedure Laterality Date   BREAST REDUCTION SURGERY  1996   CESAREAN SECTION  2001   COLONOSCOPY  10-18-2010   w/ bx's   ESOPHAGOGASTRODUODENOSCOPY N/A 06/29/2012   Procedure: ESOPHAGOGASTRODUODENOSCOPY (EGD);  Surgeon: Lodema Pilot, DO;  Location: [facility];  Service: General;  Laterality: N/A;   EVALUATION UNDER ANESTHESIA WITH ANAL FISTULECTOMY N/A 04/14/2014   Procedure: ANAL EXAM UNDER ANESTHESIA WITH  FISTULOTOMY;  Surgeon: Romie Levee, MD;  Location: [facility] ;  Service: General;  Laterality: N/A;   LAPAROSCOPIC BILATERAL SALPINGECTOMY N/A 06/29/2012   Procedure: LAPAROSCOPIC BILATERAL SALPINGECTOMY;  Surgeon: Reva Bores, MD;  Location: [facility];  Service: Gynecology;  Laterality: N/A;   LAPAROSCOPIC GASTRIC SLEEVE RESECTION N/A 06/29/2012   Procedure: LAPAROSCOPIC GASTRIC SLEEVE RESECTION;  Surgeon: Lodema Pilot, DO;  Location: [facility];  Service: General;  Laterality: N/A;   Laparoscopic Sleeve Gastrectomy with EGD   REDUCTION MAMMAPLASTY Bilateral    VAGINAL HYSTERECTOMY N/A 01/13/2017   Procedure: HYSTERECTOMY VAGINAL;  Surgeon: Reva Bores, MD;  Location: [facility];  Service: Gynecology;  Laterality: N/A;   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   Migraine without aura 09/13/2010   ANA positive 09/13/2010   Polyarthralgia 09/13/2010   Essential hypertension 01/11/2009   DE QUERVAIN'S TENOSYNOVITIS 01/26/2008   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    PCP: none listed in epic  REFERRING PROVIDER: Madelyn Brunner, DO  REFERRING DIAG: [phone] (ICD-10-CM) - Pain in left ankle and joints of left foot M76.822 (ICD-10-CM) - Left tibialis posterior tendinitis Madelyn Brunner, DO  THERAPY DIAG:  Pain in left ankle and joints of left foot  Muscle weakness (generalized)  Difficulty in walking, not elsewhere classified  Localized edema  Rationale for Evaluation and Treatment: Rehabilitation  ONSET DATE:  April 2024  SUBJECTIVE:   SUBJECTIVE STATEMENT: Pt indicated a short term improvement in some symptoms after last visit but return was noted.  Felt some aggravation of symptoms after exercise for short period of time.  Reported heel/toe most evident painful.   PERTINENT HISTORY: Seen  in 2023 for complaints in Lt ankle following pop in ankle/foot.    History of various ankle sprains in past. HTN, IBS, histor of migraines   PAIN:  NPRS scale: current 5/10 Pain location: Lt ankle/foot Pain description: burning, sharp.  Constant discomfort noted.  Aggravating factors: walking, standing  Relieving factors: shockwave, ice, rest  PRECAUTIONS: None  WEIGHT BEARING RESTRICTIONS: no  FALLS:  Has patient fallen in last 6 months? 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  OBJECTIVE:   PATIENT SURVEYS:  09/09/2022 FOTO intake: 47   predicted:  56  COGNITION: 09/09/2022 Overall cognitive status: WFL    SENSATION: 09/09/2022 WFL  EDEMA:  09/09/2022 19 inch Rt, 19.25 inch Lt.   MUSCLE LENGTH: 09/09/2022 No checks today  POSTURE:  09/09/2022 No Significant postural limitations  PALPATION: 09/09/2022 Tenderness along posterior tib tendon near medial malleolus, tenderness with trigger points noted in muscle belly as well Lt .   LOWER EXTREMITY ROM:   ROM Right 09/09/2022 Left 09/09/2022  Hip flexion    Hip extension    Hip abduction    Hip adduction    Hip internal rotation    Hip external rotation    Knee flexion    Knee extension    Ankle dorsiflexion 18 in 90 deg knee flexion 15  in 90 deg knee flexion  Ankle plantarflexion    Ankle inversion    Ankle eversion     (Blank rows = not tested)  LOWER EXTREMITY MMT:  MMT Right 09/09/2022 Left 09/09/2022 Left 09/16/2022  Hip flexion     Hip extension     Hip abduction     Hip adduction     Hip internal rotation     Hip external rotation     Knee flexion     Knee extension     Ankle dorsiflexion 5/5 5/5   Ankle plantarflexion 5/5 (20 reps PF in single leg) 3+/5 c pain noted  3+/5 c pain noted   Ankle inversion 5/5 4/5 c pain   Ankle eversion 5/5 5/5    (Blank rows = not tested)  LOWER EXTREMITY SPECIAL TESTS:  09/09/2022 No specific testing today  FUNCTIONAL TESTS:  09/09/2022 Lt SLS: 30 seconds with pain noted Rt SLS: 30 seconds  GAIT: 09/09/2022 Independent ambulation with weight deviation in stance with decreased toe off progression.  TODAY'S TREATMENT                                                                          DATE: 09/16/2022 Manual: Compression to posterior tib and medial gastroc, percussive device to same.   Therex Seated eccentric green band inversion Lt ankle 3 x 10 Seated blue band PF 3 x 15  Verbal review of existing HEP  Neuro Re-ed Tandem stance 1 min x 1 bilateral on foam  SLS on foam with corner touching contralateral leg x 8 each , performed bilaterally  Fitter rocker board fwd/back light touching x 25 with occasional HHA on bar.    TODAY'S TREATMENT                                                                          DATE:09/11/2022 Nu step L6 X 8 min UE/LE Slantboard stretch 30 sec X 3 bilat Heel and toe raises 2X10 Tandem balance 30 sec X 3 bilat Sidestepping on foam beam 5 round trips Forward/backward tandem walking on foam beam 5 round trips Ankle 4 way red 2X10 each way  TODAY'S TREATMENT                                                                          DATE:09/09/2022 Therex:    HEP instruction/performance c cues for techniques, handout provided.  Trial set performed of each for comprehension and symptom assessment.  See below for exercise list  PATIENT EDUCATION:  09/09/2022 Education details: HEP, POC Person educated: Patient Education method: Explanation, Demonstration, Verbal cues, and Handouts Education comprehension: verbalized understanding, returned demonstration, and verbal cues required  HOME EXERCISE PROGRAM: Access Code: [email or web address] URL: [email or web address] Date: 09/09/2022 Prepared by: Chyrel Masson  Exercises - Long Sitting Ankle Eversion with Resistance  - 2 x daily - 7 x weekly - 2-3 sets - 10-15 reps - Long Sitting Ankle Inversion with Resistance  - 2 x daily - 7 x weekly - 2-3 sets - 10-15 reps - Long Sitting Ankle Dorsiflexion with Anchored Resistance  - 2 x daily - 7 x weekly - 2-3 sets - 10-15  reps  ASSESSMENT:  CLINICAL IMPRESSION: Pt to continue to benefit from progressive eccentric program for strength related to Lt ankle as well as trigger point release techniques to help with any pain related to those, particularly in posterior tib and calf.   Educated on use of at home therapy gun for trigger point release options.    OBJECTIVE IMPAIRMENTS: Abnormal gait, decreased activity tolerance, decreased balance, decreased coordination, decreased endurance, decreased mobility, difficulty walking, decreased ROM, decreased strength, hypomobility, increased edema, increased fascial restrictions, impaired perceived functional ability, impaired flexibility, improper body mechanics,  and pain.   ACTIVITY LIMITATIONS: carrying, lifting, bending, standing, squatting, stairs, transfers, and locomotion level  PARTICIPATION LIMITATIONS: cleaning, laundry, interpersonal relationship, driving, shopping, community activity, and occupation  PERSONAL FACTORS: Time since onset of injury/illness/exacerbation are also affecting patient's functional outcome.   REHAB POTENTIAL: Good  CLINICAL DECISION MAKING: Stable/uncomplicated  EVALUATION COMPLEXITY: Low   GOALS: Goals reviewed with patient? Yes  SHORT TERM GOALS: (target date for Short term goals are 3 weeks 09/30/2022)   1.  Patient will demonstrate independent use of home exercise program to maintain progress from in clinic treatments.  Goal status: on going 09/16/2022  LONG TERM GOALS: (target dates for all long term goals are 10 weeks  11/18/2022 )   1. Patient will demonstrate/report pain at worst less than or equal to 2/10 to facilitate minimal limitation in daily activity secondary to pain symptoms.  Goal status: New   2. Patient will demonstrate independent use of home exercise program to facilitate ability to maintain/progress functional gains from skilled physical therapy services.  Goal status: New   3. Patient will demonstrate  FOTO outcome > or = 56 % to indicate reduced disability due to condition.  Goal status: New   4.  Patient will demonstrate Lt LE MMT 5/5 throughout to faciltiate usual transfers, stairs, squatting at [facility] for daily life.   Goal status: New   5.  Patient will demonstrate ability to perform usual work/housework at [facility] s limitation.   Goal status: New   6.  Patient will demonstrate bilateral SLS s symptoms 30 seconds.  Goal status: New     PLAN:  PT FREQUENCY: 1-2x/week  PT DURATION: 10 weeks  PLANNED INTERVENTIONS: Therapeutic exercises, Therapeutic activity, Neuro Muscular re-education, Balance training, Gait training, Patient/Family education, Joint mobilization, Stair training, DME instructions, Dry Needling, Electrical stimulation, Traction, Cryotherapy, vasopneumatic deviceMoist heat, Taping, Ultrasound, Ionotophoresis 4mg /ml Dexamethasone, and aquatic therapy, Manual therapy.  All included unless contraindicated  PLAN FOR NEXT SESSION: DN if desired posterior tib, medial gastroc/soleus   Chyrel Masson, PT, DPT, OCS, ATC 09/16/22  2:22 PM

## 2022-09-16 NOTE — Progress Notes (Signed)
Kimberly [name] - 52 y.o. female MRN [id number]  Date of birth: 01-08-1970  Office Visit Note: Visit Date: 09/16/2022 PCP: Pcp, No Referred by: No ref. provider found  Subjective: Chief Complaint  Patient presents with   Left Ankle - Follow-up, Pain   HPI: Kimberly [name] is a pleasant 52 y.o. female who presents today for follow-up of chronic left ankle pain.  Thera continues with left-sided medial ankle pain.  She has had a previous ultrasound which showed tenosynovitis of the posterior tibial tendon, but interestingly enough she did not get significant lasting relief from posterior tibial tendon injection.  He has had a few sessions of formalized physical therapy which has helped slightly.  Did respond positively to extracorporeal shockwave therapy in the past but did not get permanent long-lasting relief.  Currently wearing regular tennis shoes.  Not taking any medication consistently.  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  Has been told she is hypermobile in the past. She has rolled/sprained her left ankle many many times in the past.  Pertinent ROS were reviewed with the patient and found to be negative unless otherwise specified above in HPI.   Assessment & Plan: Visit Diagnoses:  1. Pain in left ankle and joints of left foot   2. Left tibialis posterior tendinitis   3. Chronic instability of ankle   4. Benign joint hypermobility    Plan: Discussed with him the nature of her chronic left foot and ankle pain.  A lot of her provocative exam points to her posterior tibial tendon being the pathology (but not significant relief from US-PT inj), although I do think her pain is multifactorial with this as well as her chronic ankle instability with gross joint specifically ankle hypermobility.  She has had a rather significant amount of prior ankle sprain/rolled ankle.  She does have joint  hypermobility in general with a Beighton score of 7/9 today.  Given the chronicity of her injury which was from a Zumba workout back in early April, her lack of permanent improvement with injection, physical therapy, I think it is necessary to obtain an MRI of the ankle, this was ordered today.  For her acute pain, we will place her on a 6-day Medrol Dosepak.  She will follow-up in 1 week after MRI to review next steps.  She will continue her physical therapy and ankle stabilization/balance exercises in the interim.  Follow-up: Return in about 1 week (around 09/23/2022) for after MRI of ankle .   Meds & Orders: No orders of the defined types were placed in this encounter.   Orders Placed This Encounter  Procedures   MR Ankle Left w/o contrast     Procedures: No procedures performed      Clinical History: No specialty comments available.  She reports that she has never smoked. She has never used smokeless tobacco. No results for input(s): "HGBA1C", "LABURIC" in the last 8760 hours.  Objective:   Vital Signs: LMP 01/09/2017 (Exact Date)   Physical Exam  Gen: Well-appearing, in no acute distress; non-toxic CV:  Well-perfused. Warm.  Resp: Breathing unlabored on room air; no wheezing. Psych: Fluid speech in conversation; appropriate affect; normal thought process Neuro: Sensation intact throughout. No gross coordination deficits.   Ortho Exam - Left foot/ankle: + TTP just posterior to the medial malleolus near the course of the posterior tibial tendon.  There is full range  of motion with ankle plantarflexion and dorsiflexion.  No significant swelling, redness or effusion about the ankle.  There is rather notable laxity with inversion and eversion testing of the ankle.   - Beighton Score: 7/9 (thumb, pinky, elbows, bend & reach)  Imaging:  02/13/21: Left ankle 3 views: No acute fracture.  Talus well located within the  ankle mortise no diastases.  No bony abnormalities or lesions.    Narrative & Impression  CLINICAL DATA:  Left ankle pain.   EXAM: MRI OF THE LEFT FOOT WITHOUT CONTRAST   TECHNIQUE: Multiplanar, multisequence MR imaging of the ankle was performed. No intravenous contrast was administered.   COMPARISON:  Radiographs 02/11/2021   FINDINGS: TENDONS   Peroneal: Intact.  No significant tendinopathy the or tenosynovitis.   Posteromedial: Intact. Mild tenosynovitis involving the posterior tibialis tendon.   Anterior: Intact   Achilles: Minimal distal Achilles tendinopathy.   Plantar Fascia: Intact.  No findings for plantar fasciitis.   LIGAMENTS   Lateral: Intact   Medial: Intact   CARTILAGE   Ankle Joint: Mild degenerative chondrosis but no full-thickness cartilage defect or osteochondral lesion. No joint effusion.   Subtalar Joints/Sinus Tarsi: The subtalar joints are maintained. The sinus tarsi is unremarkable. The cervical and interosseous ligaments are intact. The spring ligament is intact.   Bones: No acute bony findings. No stress fracture or AVN. Mild/early midfoot degenerative changes.   Other: Unremarkable foot and ankle musculature.   IMPRESSION: 1. Intact medial and lateral ankle ligaments and tendons. 2. Minimal distal Achilles tendinopathy. 3. Mild tibiotalar and midfoot degenerative changes. 4. No stress fracture or AVN.     Electronically Signed   By: Rudie Meyer M.D.   On: 05/17/2022 09:03   [place] Left ankle 06/05/22: Limited musculoskeletal ultrasound of the left lower extremity, left ankle  was performed today.  Evaluation of the posterior tibial tendon was seen  and visualized on the medial aspect of the medial malleoli.  No cortical  irregularity of the malleolus.  There is no evidence of tearing of the  posterior tibial tendon but there is a degree of tenosynovitis with  surrounding hypoechoic fluid at the level of the medial malleolus and  distal to it.  Flexor digitorum longus seen without  abnormality.  Tibial  nerve and associated neurovascular visualized.  Active and dynamic view of  the flexor houses longus shows no abnormality.  The posterior calcaneus  was seen without cortical irregularity, there is proper insertion of the  Achilles tendon seen in both short and long axis.  There is no significant  thickening here very minimal tendinopathy without acute changes of  hyperemia.  No evidence of tearing of the Achilles tendon scanning  proximally to distally.  Evaluation of the peroneal tendons was limited,  tendons are intact, possible very mild split tear of the peroneus longus  versus anisotropy.  No full-thickness tearing.   Past Medical/Family/Surgical/Social History: Medications & Allergies reviewed per EMR, new medications updated. 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   Migraine without aura 09/13/2010   ANA positive 09/13/2010   Polyarthralgia 09/13/2010   Essential hypertension 01/11/2009   DE QUERVAIN'S TENOSYNOVITIS 01/26/2008   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   Past Medical History:  Diagnosis Date   Anorectal fistula    Anxiety    Arthritis    neck, hips  Chronic headaches    migraines   History of hypertension    since wt loss after gastric sleeve 2014--  no longer issue   Hx of adenomatous colonic polyps 03/28/2022   Hypertension    Irritable bowel syndrome    PONV (postoperative nausea and vomiting)    slow to wake up   Wears glasses    Family History  Problem Relation Age of Onset   Stroke Mother    Diabetes Mother    Uterine cancer Mother 58   Cancer Mother        endometrial   Atrial fibrillation Mother    Colitis Father    Crohn's disease Father    Diabetes Father    Diverticulitis Father    Diabetes Sister    Uterine cancer Sister 37   Cancer Sister         endometrial   Irritable bowel syndrome Sister    Irritable bowel syndrome Brother    Ovarian cancer Paternal Grandmother    Cancer Paternal Grandmother 85       ovarian   Cancer Maternal Uncle        lung   Colon cancer Paternal Uncle    Past Surgical History:  Procedure Laterality Date   BREAST REDUCTION SURGERY  1996   CESAREAN SECTION  2001   COLONOSCOPY  10-18-2010   w/ bx's   ESOPHAGOGASTRODUODENOSCOPY N/A 06/29/2012   Procedure: ESOPHAGOGASTRODUODENOSCOPY (EGD);  Surgeon: Lodema Pilot, DO;  Location: [facility];  Service: General;  Laterality: N/A;   EVALUATION UNDER ANESTHESIA WITH ANAL FISTULECTOMY N/A 04/14/2014   Procedure: ANAL EXAM UNDER ANESTHESIA WITH  FISTULOTOMY;  Surgeon: Romie Levee, MD;  Location: [facility];  Service: General;  Laterality: N/A;   LAPAROSCOPIC BILATERAL SALPINGECTOMY N/A 06/29/2012   Procedure: LAPAROSCOPIC BILATERAL SALPINGECTOMY;  Surgeon: Reva Bores, MD;  Location: [facility];  Service: Gynecology;  Laterality: N/A;   LAPAROSCOPIC GASTRIC SLEEVE RESECTION N/A 06/29/2012   Procedure: LAPAROSCOPIC GASTRIC SLEEVE RESECTION;  Surgeon: Lodema Pilot, DO;  Location: [facility];  Service: General;  Laterality: N/A;  Laparoscopic Sleeve Gastrectomy with EGD   REDUCTION MAMMAPLASTY Bilateral    VAGINAL HYSTERECTOMY N/A 01/13/2017   Procedure: HYSTERECTOMY VAGINAL;  Surgeon: Reva Bores, MD;  Location: [facility];  Service: Gynecology;  Laterality: N/A;   Social History   Occupational History   Occupation: Engineer, site  Tobacco Use   Smoking status: Never   Smokeless tobacco: Never  Vaping Use   Vaping status: Never Used  Substance and Sexual Activity   Alcohol use: Yes    Comment: occasional   Drug use: No   Sexual activity: Yes    Birth control/protection: Surgical

## 2022-09-16 NOTE — Progress Notes (Signed)
Patient says that she has done 3 sessions at physical therapy. She says that she had dry needling last week that left her calf sore but her foot seemed a bit better for a few days.

## 2022-09-18 ENCOUNTER — Ambulatory Visit: Payer: BC Managed Care – PPO | Admitting: Rehabilitative and Restorative Service Providers"

## 2022-09-18 ENCOUNTER — Encounter: Payer: Self-pay | Admitting: Rehabilitative and Restorative Service Providers"

## 2022-09-18 DIAGNOSIS — M25572 Pain in left ankle and joints of left foot: Secondary | ICD-10-CM | POA: Diagnosis not present

## 2022-09-18 DIAGNOSIS — M6281 Muscle weakness (generalized): Secondary | ICD-10-CM | POA: Diagnosis not present

## 2022-09-18 DIAGNOSIS — R600 Localized edema: Secondary | ICD-10-CM | POA: Diagnosis not present

## 2022-09-18 DIAGNOSIS — R262 Difficulty in walking, not elsewhere classified: Secondary | ICD-10-CM

## 2022-09-18 NOTE — Therapy (Signed)
OUTPATIENT PHYSICAL THERAPY  TREATMENT   Patient Name: Kimberly [name] MRN: [id number] DOB:[birth year], 52 y.o., female Today's Date: 09/18/2022  END OF SESSION:  PT End of Session - 09/18/22 1257     Visit Number 4    Number of Visits 20    Date for PT Re-Evaluation 11/18/22    Authorization Type [employer] 30% coinsurance, 30 visits    Progress Note Due on Visit 10    PT Start Time 1258    PT Stop Time 1337    PT Time Calculation (min) 39 min    Activity Tolerance Patient tolerated treatment well    Behavior During Therapy WFL for tasks assessed/performed               Past Medical History:  Diagnosis Date   Anorectal fistula    Anxiety    Arthritis    neck, hips   Chronic headaches    migraines   History of hypertension    since wt loss after gastric sleeve 2014--  no longer issue   Hx of adenomatous colonic polyps 03/28/2022   Hypertension    Irritable bowel syndrome    PONV (postoperative nausea and vomiting)    slow to wake up   Wears glasses    Past Surgical History:  Procedure Laterality Date   BREAST REDUCTION SURGERY  1996   CESAREAN SECTION  2001   COLONOSCOPY  10-18-2010   w/ bx's   ESOPHAGOGASTRODUODENOSCOPY N/A 06/29/2012   Procedure: ESOPHAGOGASTRODUODENOSCOPY (EGD);  Surgeon: Lodema Pilot, DO;  Location: [facility];  Service: General;  Laterality: N/A;   EVALUATION UNDER ANESTHESIA WITH ANAL FISTULECTOMY N/A 04/14/2014   Procedure: ANAL EXAM UNDER ANESTHESIA WITH  FISTULOTOMY;  Surgeon: Romie Levee, MD;  Location: [facility];  Service: General;  Laterality: N/A;   LAPAROSCOPIC BILATERAL SALPINGECTOMY N/A 06/29/2012   Procedure: LAPAROSCOPIC BILATERAL SALPINGECTOMY;  Surgeon: Reva Bores, MD;  Location: [facility];  Service: Gynecology;  Laterality: N/A;   LAPAROSCOPIC GASTRIC SLEEVE RESECTION N/A 06/29/2012   Procedure: LAPAROSCOPIC GASTRIC SLEEVE RESECTION;  Surgeon: Lodema Pilot, DO;  Location: [facility];  Service: General;  Laterality: N/A;   Laparoscopic Sleeve Gastrectomy with EGD   REDUCTION MAMMAPLASTY Bilateral    VAGINAL HYSTERECTOMY N/A 01/13/2017   Procedure: HYSTERECTOMY VAGINAL;  Surgeon: Reva Bores, MD;  Location: [facility];  Service: Gynecology;  Laterality: N/A;   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   Migraine without aura 09/13/2010   ANA positive 09/13/2010   Polyarthralgia 09/13/2010   Essential hypertension 01/11/2009   DE QUERVAIN'S TENOSYNOVITIS 01/26/2008   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    PCP: none listed in epic  REFERRING PROVIDER: Madelyn Brunner, DO  REFERRING DIAG: [phone] (ICD-10-CM) - Pain in left ankle and joints of left foot M76.822 (ICD-10-CM) - Left tibialis posterior tendinitis Madelyn Brunner, DO  THERAPY DIAG:  Pain in left ankle and joints of left foot  Muscle weakness (generalized)  Difficulty in walking, not elsewhere classified  Localized edema  Rationale for Evaluation and Treatment: Rehabilitation  ONSET DATE:  April 2024  SUBJECTIVE:   SUBJECTIVE STATEMENT: Pt indicated doing a exercise class with "pretty uncomfortable" with foot and also felt unstable.  Reported pain in activity 6/10.   PERTINENT HISTORY: Seen in 2023 for complaints in Lt ankle following pop in ankle/foot.  History of various ankle sprains in past. HTN, IBS, histor of migraines   PAIN:  NPRS scale: current /10 Pain location: Lt ankle/foot Pain description: burning, sharp.  Constant discomfort noted.  Aggravating factors: walking, standing  Relieving factors: shockwave, ice, rest  PRECAUTIONS: None  WEIGHT BEARING RESTRICTIONS: no  FALLS:  Has patient fallen in last 6 months? 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  OBJECTIVE:   PATIENT SURVEYS:  09/09/2022 FOTO intake: 47   predicted:  56  COGNITION: 09/09/2022 Overall cognitive status: WFL    SENSATION: 09/09/2022 WFL  EDEMA:  09/09/2022 19 inch Rt, 19.25 inch Lt.   MUSCLE LENGTH: 09/09/2022 No checks today  POSTURE:  09/09/2022 No Significant postural limitations  PALPATION: 09/09/2022 Tenderness along posterior tib tendon near medial malleolus, tenderness with trigger points noted in muscle belly as well Lt .   LOWER EXTREMITY ROM:   ROM Right 09/09/2022 Left 09/09/2022  Hip flexion    Hip extension    Hip abduction    Hip adduction    Hip internal rotation    Hip external rotation    Knee flexion    Knee extension    Ankle dorsiflexion 18 in 90 deg knee flexion 15  in 90 deg knee flexion  Ankle plantarflexion    Ankle inversion    Ankle eversion     (Blank rows = not tested)  LOWER EXTREMITY MMT:  MMT Right 09/09/2022 Left 09/09/2022 Left 09/16/2022  Hip flexion     Hip extension     Hip abduction     Hip adduction     Hip internal rotation     Hip external rotation     Knee flexion     Knee extension     Ankle dorsiflexion 5/5 5/5   Ankle plantarflexion 5/5 (20 reps PF in single leg) 3+/5 c pain noted  3+/5 c pain noted   Ankle inversion 5/5 4/5 c pain   Ankle eversion 5/5 5/5    (Blank rows = not tested)  LOWER EXTREMITY SPECIAL TESTS:  09/09/2022 No specific testing today  FUNCTIONAL TESTS:  09/09/2022 Lt SLS: 30 seconds with pain noted Rt SLS: 30 seconds  GAIT: 09/09/2022 Independent ambulation with weight deviation in stance with decreased toe off progression.                                                                                                                                                                         TODAY'S TREATMENT  DATE: 09/18/2022 Manual: Compression to posterior tib  Trigger Point Dry-Needling  Treatment instructions: Expect mild to moderate muscle soreness. S/S of pneumothorax if dry needled over a lung field, and to seek immediate medical attention should they occur. Patient verbalized understanding of these instructions and education.  Patient Consent Given: Yes Education handout provided: Previously provided Muscles treated: Lt posterior tib Treatment response/outcome: twitch response, fair tolernace. No adverse reactions post treatment other than usual soreness.   Therex Recumbent bike lvl 2 6 mins Runner stretch incline board Lt leg posterior 30 sec x 5 (cues for home use ) Double leg up, single leg down PF attempt x 5 (stopped due to pain) Calf press on leg press machine 2 x 10 31 lbs Lt leg   Neuro Re-ed SLS on foam with corner touching contralateral leg x 8 each , performed bilaterally  Fitter rocker board fwd/back light touching x 20 with occasional HHA on bar, Lt SL x 10.  TODAY'S TREATMENT                                                                          DATE: 09/16/2022 Manual: Compression to posterior tib and medial gastroc, percussive device to same.   Therex Seated eccentric green band inversion Lt ankle 3 x 10 Seated blue band PF 3 x 15  Verbal review of existing HEP  Neuro Re-ed Tandem stance 1 min x 1 bilateral on foam  SLS on foam with corner touching contralateral leg x 8 each , performed bilaterally  Fitter rocker board fwd/back light touching x 25 with occasional HHA on bar.     PATIENT EDUCATION:  09/09/2022 Education details: HEP, POC Person educated: Patient Education method: Programmer, multimedia, Demonstration, Verbal cues, and Handouts Education comprehension: verbalized understanding, returned demonstration, and verbal cues required  HOME EXERCISE PROGRAM: Access Code: [email or web address] URL:  [email or web address] Date: 09/09/2022 Prepared by: Chyrel Masson  Exercises - Long Sitting Ankle Eversion with Resistance  - 2 x daily - 7 x weekly - 2-3 sets - 10-15 reps - Long Sitting Ankle Inversion with Resistance  - 2 x daily - 7 x weekly - 2-3 sets - 10-15 reps - Long Sitting Ankle Dorsiflexion with Anchored Resistance  - 2 x daily - 7 x weekly - 2-3 sets - 10-15 reps  ASSESSMENT:  CLINICAL IMPRESSION: Utilized trigger point needling/compression again today.  Continued strong twitch response noted in posterior tib with concordant symptoms into foot noted with twitch response.   Still limited in PF and inversion activation interventions with symptoms response.    OBJECTIVE IMPAIRMENTS: Abnormal gait, decreased activity tolerance, decreased balance, decreased coordination, decreased endurance, decreased mobility, difficulty walking, decreased ROM, decreased strength, hypomobility, increased edema, increased fascial restrictions, impaired perceived functional ability, impaired flexibility, improper body mechanics, and pain.   ACTIVITY LIMITATIONS: carrying, lifting, bending, standing, squatting, stairs, transfers, and locomotion level  PARTICIPATION LIMITATIONS: cleaning, laundry, interpersonal relationship, driving, shopping, community activity, and occupation  PERSONAL FACTORS: Time since onset of injury/illness/exacerbation are also affecting patient's functional outcome.   REHAB POTENTIAL: Good  CLINICAL DECISION MAKING: Stable/uncomplicated  EVALUATION COMPLEXITY: Low   GOALS: Goals reviewed with patient? Yes  SHORT TERM GOALS: (target date for Short term goals are 3 weeks 09/30/2022)  1.  Patient will demonstrate independent use of home exercise program to maintain progress from in clinic treatments.  Goal status: on going 09/16/2022  LONG TERM GOALS: (target dates for all long term goals are 10 weeks  11/18/2022 )   1. Patient will demonstrate/report  pain at worst less than or equal to 2/10 to facilitate minimal limitation in daily activity secondary to pain symptoms.  Goal status: New   2. Patient will demonstrate independent use of home exercise program to facilitate ability to maintain/progress functional gains from skilled physical therapy services.  Goal status: New   3. Patient will demonstrate FOTO outcome > or = 56 % to indicate reduced disability due to condition.  Goal status: New   4.  Patient will demonstrate Lt LE MMT 5/5 throughout to faciltiate usual transfers, stairs, squatting at [facility] for daily life.   Goal status: New   5.  Patient will demonstrate ability to perform usual work/housework at [facility] s limitation.   Goal status: New   6.  Patient will demonstrate bilateral SLS s symptoms 30 seconds.  Goal status: New     PLAN:  PT FREQUENCY: 1-2x/week  PT DURATION: 10 weeks  PLANNED INTERVENTIONS: Therapeutic exercises, Therapeutic activity, Neuro Muscular re-education, Balance training, Gait training, Patient/Family education, Joint mobilization, Stair training, DME instructions, Dry Needling, Electrical stimulation, Traction, Cryotherapy, vasopneumatic deviceMoist heat, Taping, Ultrasound, Ionotophoresis 4mg /ml Dexamethasone, and aquatic therapy, Manual therapy.  All included unless contraindicated  PLAN FOR NEXT SESSION: DN if desired posterior tib as desired.  Progressive strengthening as able.    Chyrel Masson, PT, DPT, OCS, ATC 09/18/22  1:38 PM

## 2022-09-22 ENCOUNTER — Encounter: Payer: BC Managed Care – PPO | Admitting: Rehabilitative and Restorative Service Providers"

## 2022-09-22 ENCOUNTER — Telehealth: Payer: Self-pay | Admitting: Rehabilitative and Restorative Service Providers"

## 2022-09-22 NOTE — Telephone Encounter (Signed)
Called after 15 mins no show.  Unable to leave message.   Chyrel Masson, PT, DPT, OCS, ATC 09/22/22  2:46 PM

## 2022-09-24 ENCOUNTER — Ambulatory Visit: Payer: BC Managed Care – PPO | Admitting: Rehabilitative and Restorative Service Providers"

## 2022-09-24 ENCOUNTER — Encounter: Payer: Self-pay | Admitting: Rehabilitative and Restorative Service Providers"

## 2022-09-24 DIAGNOSIS — M25572 Pain in left ankle and joints of left foot: Secondary | ICD-10-CM

## 2022-09-24 DIAGNOSIS — R600 Localized edema: Secondary | ICD-10-CM | POA: Diagnosis not present

## 2022-09-24 DIAGNOSIS — M6281 Muscle weakness (generalized): Secondary | ICD-10-CM | POA: Diagnosis not present

## 2022-09-24 DIAGNOSIS — R262 Difficulty in walking, not elsewhere classified: Secondary | ICD-10-CM

## 2022-09-24 NOTE — Therapy (Signed)
OUTPATIENT PHYSICAL THERAPY  TREATMENT   Patient Name: Kimberly [name] MRN: [id number] DOB:1971-01-03, 52 y.o., female Today's Date: 09/24/2022  END OF SESSION:  PT End of Session - 09/24/22 1506     Visit Number 5    Number of Visits 20    Date for PT Re-Evaluation 11/18/22    Authorization Type BCBS 30% coinsurance, 30 visits    Progress Note Due on Visit 10    PT Start Time 1502    PT Stop Time 1540    PT Time Calculation (min) 38 min    Activity Tolerance Patient tolerated treatment well    Behavior During Therapy WFL for tasks assessed/performed                Past Medical History:  Diagnosis Date   Anorectal fistula    Anxiety    Arthritis    neck, hips   Chronic headaches    migraines   History of hypertension    since wt loss after gastric sleeve 2014--  no longer issue   Hx of adenomatous colonic polyps 03/28/2022   Hypertension    Irritable bowel syndrome    PONV (postoperative nausea and vomiting)    slow to wake up   Wears glasses    Past Surgical History:  Procedure Laterality Date   BREAST REDUCTION SURGERY  1996   CESAREAN SECTION  2001   COLONOSCOPY  10-18-2010   w/ bx's   ESOPHAGOGASTRODUODENOSCOPY N/A 06/29/2012   Procedure: ESOPHAGOGASTRODUODENOSCOPY (EGD);  Surgeon: Lodema Pilot, DO;  Location: [facility];  Service: General;  Laterality: N/A;   EVALUATION UNDER ANESTHESIA WITH ANAL FISTULECTOMY N/A 04/14/2014   Procedure: ANAL EXAM UNDER ANESTHESIA WITH  FISTULOTOMY;  Surgeon: Romie Levee, MD;  Location: [facility] [address];  Service: General;  Laterality: N/A;   LAPAROSCOPIC BILATERAL SALPINGECTOMY N/A 06/29/2012   Procedure: LAPAROSCOPIC BILATERAL SALPINGECTOMY;  Surgeon: Reva Bores, MD;  Location: [facility];  Service: Gynecology;  Laterality: N/A;   LAPAROSCOPIC GASTRIC SLEEVE RESECTION N/A 06/29/2012   Procedure: LAPAROSCOPIC GASTRIC SLEEVE RESECTION;  Surgeon: Lodema Pilot, DO;  Location: [facility];  Service: General;  Laterality: N/A;   Laparoscopic Sleeve Gastrectomy with EGD   REDUCTION MAMMAPLASTY Bilateral    VAGINAL HYSTERECTOMY N/A 01/13/2017   Procedure: HYSTERECTOMY VAGINAL;  Surgeon: Reva Bores, MD;  Location: [facility];  Service: Gynecology;  Laterality: N/A;   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   Migraine without aura 09/13/2010   ANA positive 09/13/2010   Polyarthralgia 09/13/2010   Essential hypertension 01/11/2009   DE QUERVAIN'S TENOSYNOVITIS 01/26/2008   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    PCP: none listed in epic  REFERRING PROVIDER: Madelyn Brunner, DO  REFERRING DIAG: [phone] (ICD-10-CM) - Pain in left ankle and joints of left foot M76.822 (ICD-10-CM) - Left tibialis posterior tendinitis Madelyn Brunner, DO  THERAPY DIAG:  Pain in left ankle and joints of left foot  Muscle weakness (generalized)  Difficulty in walking, not elsewhere classified  Localized edema  Rationale for Evaluation and Treatment: Rehabilitation  ONSET DATE:  April 2024  SUBJECTIVE:   SUBJECTIVE STATEMENT: Pt indicated real sore after last visit and crampy feel in needle area and foot.   Pt indicated doing stretching and assist stretch place.   Lasted until Saturday.   Reported symptoms are the same in  foot overall.    PERTINENT HISTORY: Seen in 2023 for complaints in Lt ankle following pop in ankle/foot.    History of various ankle sprains in past. HTN, IBS, histor of migraines   PAIN:  NPRS scale: current  5/10 Pain location: Lt ankle/foot Pain description: burning, sharp.  Constant discomfort noted.  Aggravating factors: walking, standing  Relieving factors: shockwave, ice, rest  PRECAUTIONS: None  WEIGHT BEARING RESTRICTIONS: no  FALLS:  Has patient fallen in last 6 months? 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  OBJECTIVE:   PATIENT SURVEYS:  09/09/2022 FOTO intake: 47   predicted:  56  COGNITION: 09/09/2022 Overall cognitive status: WFL    SENSATION: 09/09/2022 WFL  EDEMA:  09/09/2022 19 inch Rt, 19.25 inch Lt.   MUSCLE LENGTH: 09/09/2022 No checks today  POSTURE:  09/09/2022 No Significant postural limitations  PALPATION: 09/09/2022 Tenderness along posterior tib tendon near medial malleolus, tenderness with trigger points noted in muscle belly as well Lt .   LOWER EXTREMITY ROM:   ROM Right 09/09/2022 Left 09/09/2022  Hip flexion    Hip extension    Hip abduction    Hip adduction    Hip internal rotation    Hip external rotation    Knee flexion    Knee extension    Ankle dorsiflexion 18 in 90 deg knee flexion 15  in 90 deg knee flexion  Ankle plantarflexion    Ankle inversion    Ankle eversion     (Blank rows = not tested)  LOWER EXTREMITY MMT:  MMT Right 09/09/2022 Left 09/09/2022 Left 09/16/2022  Hip flexion     Hip extension     Hip abduction     Hip adduction     Hip internal rotation     Hip external rotation     Knee flexion     Knee extension     Ankle dorsiflexion 5/5 5/5   Ankle plantarflexion 5/5 (20 reps PF in single leg) 3+/5 c pain noted  3+/5 c pain noted   Ankle inversion 5/5 4/5 c pain   Ankle eversion 5/5 5/5    (Blank rows = not tested)  LOWER EXTREMITY SPECIAL TESTS:  09/09/2022 No specific testing today  FUNCTIONAL TESTS:  09/09/2022 Lt SLS: 30 seconds with pain noted Rt SLS: 30 seconds  GAIT: 09/24/2022:  Decreased stance on Lt.   09/09/2022 Independent ambulation with weight deviation in stance with decreased toe off progression.  TODAY'S TREATMENT                                                                          DATE: 09/24/2022 Manual: Percussive device to medial gastroc, tibialis posterior   Therex Calf press on leg press machine 3 x 10 25 lbs Lt leg  Isometric inversion hold 10 sec x 6, PF/inversion hold 10 sec x 3  (cues for home use).   Neuro Re-ed Tandem stance on foam 1 min x 1 bilateral  SLS on foam with corner touching contralateral leg x 6 each , performed bilaterally  Alternating heel/toe lift on foam bilaterally x 15  Ionto Dexamethasone 1. 0 dosage 6 hr patch at home.   TODAY'S TREATMENT                                                                          DATE: 09/18/2022 Manual: Compression to posterior tib  Trigger Point Dry-Needling  Treatment instructions: Expect mild to moderate muscle soreness. S/S of pneumothorax if dry needled over a lung field, and to seek immediate medical attention should they occur. Patient verbalized understanding of these instructions and education.  Patient Consent Given: Yes Education handout provided: Previously provided Muscles treated: Lt posterior tib Treatment response/outcome: twitch response, fair tolernace. No adverse reactions post treatment other than usual soreness.   Therex Recumbent bike lvl 2 6 mins Runner stretch incline board Lt leg posterior 30 sec x 5 (cues for home use ) Double leg up, single leg down PF attempt x 5 (stopped due to pain) Calf press on leg press machine 2 x 10 31 lbs Lt leg   Neuro Re-ed SLS on foam with corner touching contralateral leg x 8 each , performed bilaterally  Fitter rocker board fwd/back light touching x 20 with occasional HHA on bar, Lt SL x 10.  TODAY'S TREATMENT                                                                          DATE: 09/16/2022 Manual: Compression to posterior tib and medial gastroc, percussive  device to same.   Therex Seated eccentric green band inversion Lt ankle 3 x 10 Seated blue band PF 3 x 15  Verbal review of existing HEP  Neuro Re-ed Tandem stance 1 min x 1 bilateral on foam  SLS on foam with corner touching contralateral leg x 8 each , performed bilaterally  Fitter rocker board fwd/back light touching x 25 with occasional HHA on bar.     PATIENT EDUCATION:  09/09/2022 Education details: HEP, POC Person educated: Patient Education method: Explanation, Demonstration, Verbal cues, and Handouts Education comprehension: verbalized understanding, returned demonstration, and verbal cues  required  HOME EXERCISE PROGRAM: Access Code: [email or web address] URL: [email or web address] Date: 09/09/2022 Prepared by: Chyrel Masson  Exercises - Long Sitting Ankle Eversion with Resistance  - 2 x daily - 7 x weekly - 2-3 sets - 10-15 reps - Long Sitting Ankle Inversion with Resistance  - 2 x daily - 7 x weekly - 2-3 sets - 10-15 reps - Long Sitting Ankle Dorsiflexion with Anchored Resistance  - 2 x daily - 7 x weekly - 2-3 sets - 10-15 reps  ASSESSMENT:  CLINICAL IMPRESSION: Utilized trigger point needling/compression again today.  Continued strong twitch response noted in posterior tib with concordant symptoms into foot noted with twitch response.   Still limited in PF and inversion activation interventions with symptoms response.    OBJECTIVE IMPAIRMENTS: Abnormal gait, decreased activity tolerance, decreased balance, decreased coordination, decreased endurance, decreased mobility, difficulty walking, decreased ROM, decreased strength, hypomobility, increased edema, increased fascial restrictions, impaired perceived functional ability, impaired flexibility, improper body mechanics, and pain.   ACTIVITY LIMITATIONS: carrying, lifting, bending, standing, squatting, stairs, transfers, and locomotion level  PARTICIPATION LIMITATIONS: cleaning, laundry, interpersonal  relationship, driving, shopping, community activity, and occupation  PERSONAL FACTORS: Time since onset of injury/illness/exacerbation are also affecting patient's functional outcome.   REHAB POTENTIAL: Good  CLINICAL DECISION MAKING: Stable/uncomplicated  EVALUATION COMPLEXITY: Low   GOALS: Goals reviewed with patient? Yes  SHORT TERM GOALS: (target date for Short term goals are 3 weeks 09/30/2022)   1.  Patient will demonstrate independent use of home exercise program to maintain progress from in clinic treatments.  Goal status: on going 09/16/2022  LONG TERM GOALS: (target dates for all long term goals are 10 weeks  11/18/2022 )   1. Patient will demonstrate/report pain at worst less than or equal to 2/10 to facilitate minimal limitation in daily activity secondary to pain symptoms.  Goal status: New   2. Patient will demonstrate independent use of home exercise program to facilitate ability to maintain/progress functional gains from skilled physical therapy services.  Goal status: New   3. Patient will demonstrate FOTO outcome > or = 56 % to indicate reduced disability due to condition.  Goal status: New   4.  Patient will demonstrate Lt LE MMT 5/5 throughout to faciltiate usual transfers, stairs, squatting at [facility] for daily life.   Goal status: New   5.  Patient will demonstrate ability to perform usual work/housework at [facility] s limitation.   Goal status: New   6.  Patient will demonstrate bilateral SLS s symptoms 30 seconds.  Goal status: New     PLAN:  PT FREQUENCY: 1-2x/week  PT DURATION: 10 weeks  PLANNED INTERVENTIONS: Therapeutic exercises, Therapeutic activity, Neuro Muscular re-education, Balance training, Gait training, Patient/Family education, Joint mobilization, Stair training, DME instructions, Dry Needling, Electrical stimulation, Traction, Cryotherapy, vasopneumatic deviceMoist heat, Taping, Ultrasound, Ionotophoresis 4mg /ml Dexamethasone, and  aquatic therapy, Manual therapy.  All included unless contraindicated  PLAN FOR NEXT SESSION: Follow up isometric holds use.    Chyrel Masson, PT, DPT, OCS, ATC 09/24/22  3:43 PM

## 2022-09-29 ENCOUNTER — Encounter: Payer: Self-pay | Admitting: Physical Therapy

## 2022-09-29 ENCOUNTER — Ambulatory Visit (INDEPENDENT_AMBULATORY_CARE_PROVIDER_SITE_OTHER): Payer: BC Managed Care – PPO | Admitting: Physical Therapy

## 2022-09-29 DIAGNOSIS — M6281 Muscle weakness (generalized): Secondary | ICD-10-CM

## 2022-09-29 DIAGNOSIS — R600 Localized edema: Secondary | ICD-10-CM | POA: Diagnosis not present

## 2022-09-29 DIAGNOSIS — M25572 Pain in left ankle and joints of left foot: Secondary | ICD-10-CM

## 2022-09-29 DIAGNOSIS — R262 Difficulty in walking, not elsewhere classified: Secondary | ICD-10-CM

## 2022-09-29 NOTE — Therapy (Signed)
OUTPATIENT PHYSICAL THERAPY  TREATMENT/Discharge PHYSICAL THERAPY DISCHARGE SUMMARY  Visits from Start of Care: 6  Current functional level related to goals / functional outcomes: See below   Remaining deficits: See below   Education / Equipment: HEP  Plan:  Patient goals were not met. Patient is being discharged due to lack of progress and she will have MRI then follow up with PT if needed.       Patient Name: Kimberly [name] MRN: [id number] DOB:12/08/1970, 52 y.o., female Today's Date: 09/29/2022  END OF SESSION:  PT End of Session - 09/29/22 1540     Visit Number 6    Number of Visits 20    Date for PT Re-Evaluation 11/18/22    Authorization Type [employer] 30% coinsurance, 30 visits    Progress Note Due on Visit 10    PT Start Time 1500    PT Stop Time 1540    PT Time Calculation (min) 40 min    Activity Tolerance Patient tolerated treatment well    Behavior During Therapy WFL for tasks assessed/performed                Past Medical History:  Diagnosis Date   Anorectal fistula    Anxiety    Arthritis    neck, hips   Chronic headaches    migraines   History of hypertension    since wt loss after gastric sleeve 2014--  no longer issue   Hx of adenomatous colonic polyps 03/28/2022   Hypertension    Irritable bowel syndrome    PONV (postoperative nausea and vomiting)    slow to wake up   Wears glasses    Past Surgical History:  Procedure Laterality Date   BREAST REDUCTION SURGERY  1996   CESAREAN SECTION  2001   COLONOSCOPY  10-18-2010   w/ bx's   ESOPHAGOGASTRODUODENOSCOPY N/A 06/29/2012   Procedure: ESOPHAGOGASTRODUODENOSCOPY (EGD);  Surgeon: Lodema Pilot, DO;  Location: [facility];  Service: General;  Laterality: N/A;   EVALUATION UNDER ANESTHESIA WITH ANAL FISTULECTOMY N/A 04/14/2014   Procedure: ANAL EXAM UNDER ANESTHESIA WITH  FISTULOTOMY;  Surgeon: Romie Levee, MD;  Location: [facility];  Service: General;  Laterality: N/A;    LAPAROSCOPIC BILATERAL SALPINGECTOMY N/A 06/29/2012   Procedure: LAPAROSCOPIC BILATERAL SALPINGECTOMY;  Surgeon: Reva Bores, MD;  Location: [facility];  Service: Gynecology;  Laterality: N/A;   LAPAROSCOPIC GASTRIC SLEEVE RESECTION N/A 06/29/2012   Procedure: LAPAROSCOPIC GASTRIC SLEEVE RESECTION;  Surgeon: Lodema Pilot, DO;  Location: [facility];  Service: General;  Laterality: N/A;  Laparoscopic Sleeve Gastrectomy with EGD   REDUCTION MAMMAPLASTY Bilateral    VAGINAL HYSTERECTOMY N/A 01/13/2017   Procedure: HYSTERECTOMY VAGINAL;  Surgeon: Reva Bores, MD;  Location: [facility];  Service: Gynecology;  Laterality: N/A;   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   Migraine without aura 09/13/2010   ANA positive 09/13/2010   Polyarthralgia 09/13/2010   Essential hypertension 01/11/2009   DE QUERVAIN'S TENOSYNOVITIS 01/26/2008   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    PCP: none listed in epic  REFERRING PROVIDER: Madelyn Brunner, DO  REFERRING DIAG: [phone] (ICD-10-CM) - Pain in left ankle and joints of left foot M76.822 (ICD-10-CM) - Left tibialis posterior tendinitis Madelyn Brunner, DO  THERAPY DIAG:  Pain in left ankle and joints of left foot  Muscle weakness (  generalized)  Difficulty in walking, not elsewhere classified  Localized edema  Rationale for Evaluation and Treatment: Rehabilitation  ONSET DATE:  April 2024  SUBJECTIVE:   SUBJECTIVE STATEMENT: Pt indicated real sore after last visit and crampy feel in needle area and foot.   Pt indicated doing stretching and assist stretch place.   Lasted until Saturday.   Reported symptoms are the same in foot overall.    PERTINENT HISTORY: Seen in 2023 for complaints in Lt ankle following pop in ankle/foot.    History of various ankle sprains in past. HTN, IBS, histor of migraines    PAIN:  NPRS scale: current  5/10 Pain location: Lt ankle/foot Pain description: burning, sharp.  Constant discomfort noted.  Aggravating factors: walking, standing  Relieving factors: shockwave, ice, rest  PRECAUTIONS: None  WEIGHT BEARING RESTRICTIONS: no  FALLS:  Has patient fallen in last 6 months? 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  OBJECTIVE:   PATIENT SURVEYS:  09/09/2022 FOTO intake: 47   predicted:  56  COGNITION: 09/09/2022 Overall cognitive status: WFL    SENSATION: 09/09/2022 WFL  EDEMA:  09/09/2022 19 inch Rt, 19.25 inch Lt.   MUSCLE LENGTH: 09/09/2022 No checks today  POSTURE:  09/09/2022 No Significant postural limitations  PALPATION: 09/09/2022 Tenderness along posterior tib tendon near medial malleolus, tenderness with trigger points noted in muscle belly as well Lt .   LOWER EXTREMITY ROM:   ROM Right 09/09/2022 Left 09/09/2022  Hip flexion    Hip extension    Hip abduction    Hip adduction    Hip internal rotation    Hip external rotation    Knee flexion    Knee extension    Ankle dorsiflexion 18 in 90 deg knee flexion 15  in 90 deg knee flexion  Ankle plantarflexion    Ankle inversion    Ankle eversion     (Blank rows = not tested)  LOWER EXTREMITY MMT:  MMT Right 09/09/2022 Left 09/09/2022 Left 09/16/2022  Hip flexion     Hip extension     Hip abduction     Hip adduction     Hip internal rotation     Hip external rotation     Knee flexion     Knee extension     Ankle dorsiflexion 5/5 5/5   Ankle plantarflexion 5/5 (20 reps PF in single leg) 3+/5 c pain noted  3+/5 c pain noted   Ankle inversion 5/5 4/5 c pain   Ankle eversion 5/5 5/5    (Blank rows = not tested)  LOWER EXTREMITY  SPECIAL TESTS:  09/09/2022 No specific testing today  FUNCTIONAL TESTS:  09/09/2022 Lt SLS: 30 seconds with pain noted Rt SLS: 30 seconds  GAIT: 09/24/2022:  Decreased stance on Lt.   09/09/2022 Independent ambulation with weight deviation in stance with decreased toe off progression.  TODAY'S TREATMENT                                                                          DATE:  09/29/2022 Manual: Soft tissue massage and IASTM to tibialis posterior, gastroc, plantar fascia KT tape for posterior tibialis   Self care: Education for KT tape and if helpful where she can purchase and how to apply, instructions to not leave on more than 5 days at a time and to remove if itching or burning PT plan of care recommendations to discharge today while she awaits MRI    09/24/2022 Manual: Percussive device to medial gastroc, tibialis posterior   Therex Calf press on leg press machine 3 x 10 25 lbs Lt leg  Isometric inversion hold 10 sec x 6, PF/inversion hold 10 sec x 3  (cues for home use).   Neuro Re-ed Tandem stance on foam 1 min x 1 bilateral  SLS on foam with corner touching contralateral leg x 6 each , performed bilaterally  Alternating heel/toe lift on foam bilaterally x 15  Ionto Dexamethasone 1. 0 dosage 6 hr patch at home.   TODAY'S TREATMENT                                                                          DATE: 09/18/2022 Manual: Compression to posterior tib  Trigger Point Dry-Needling  Treatment instructions: Expect mild to moderate muscle soreness. S/S of pneumothorax if dry needled over a lung field, and to seek immediate medical attention should they occur. Patient verbalized understanding of these instructions and education.  Patient Consent Given: Yes Education handout provided: Previously  provided Muscles treated: Lt posterior tib Treatment response/outcome: twitch response, fair tolernace. No adverse reactions post treatment other than usual soreness.   Therex Recumbent bike lvl 2 6 mins Runner stretch incline board Lt leg posterior 30 sec x 5 (cues for home use ) Double leg up, single leg down PF attempt x 5 (stopped due to pain) Calf press on leg press machine 2 x 10 31 lbs Lt leg   Neuro Re-ed SLS on foam with corner touching contralateral leg x 8 each , performed bilaterally  Fitter rocker board fwd/back light touching x 20 with occasional HHA on bar, Lt SL x 10.  TODAY'S TREATMENT                                                                          DATE: 09/16/2022 Manual: Compression to posterior tib and medial gastroc, percussive device to same.   Therex Seated eccentric green band inversion Lt ankle 3 x 10 Seated blue band PF 3 x 15  Verbal review of existing HEP  Neuro Re-ed Tandem stance 1 min x 1 bilateral on foam  SLS on foam with corner touching contralateral leg x 8 each , performed bilaterally  Fitter rocker board fwd/back light touching x 25 with occasional HHA on bar.     PATIENT EDUCATION:  09/09/2022 Education details: HEP, POC Person educated: Patient Education method: Programmer, multimedia, Demonstration, Verbal cues, and Handouts Education comprehension: verbalized understanding, returned demonstration, and verbal cues required  HOME EXERCISE PROGRAM: Access Code: [email or web address] URL: [email or web address] Date: 09/09/2022 Prepared by: Chyrel Masson  Exercises - Long Sitting Ankle Eversion with Resistance  - 2 x daily - 7 x weekly - 2-3 sets - 10-15 reps - Long Sitting Ankle Inversion with Resistance  - 2 x daily - 7 x weekly - 2-3 sets - 10-15 reps - Long Sitting Ankle Dorsiflexion with Anchored Resistance  - 2 x daily - 7 x weekly - 2-3 sets - 10-15 reps  ASSESSMENT:  CLINICAL IMPRESSION: She does not relay any significant  long term progress with PT so patient is being discharged due to lack of progress and she will have MRI then follow up with PT if needed. I did try KT tape today to see if this helps any and if so, showed her how to perform this at home.   OBJECTIVE IMPAIRMENTS: Abnormal gait, decreased activity tolerance, decreased balance, decreased coordination, decreased endurance, decreased mobility, difficulty walking, decreased ROM, decreased strength, hypomobility, increased edema, increased fascial restrictions, impaired perceived functional ability, impaired flexibility, improper body mechanics, and pain.   ACTIVITY LIMITATIONS: carrying, lifting, bending, standing, squatting, stairs, transfers, and locomotion level  PARTICIPATION LIMITATIONS: cleaning, laundry, interpersonal relationship, driving, shopping, community activity, and occupation  PERSONAL FACTORS: Time since onset of injury/illness/exacerbation are also affecting patient's functional outcome.   REHAB POTENTIAL: Good  CLINICAL DECISION MAKING: Stable/uncomplicated  EVALUATION COMPLEXITY: Low   GOALS: Goals reviewed with patient? Yes  SHORT TERM GOALS: (target date for Short term goals are 3 weeks 09/30/2022)   1.  Patient will demonstrate independent use of home exercise program to maintain progress from in clinic treatments.  Goal status: MET 09/29/22  LONG TERM GOALS: (target dates for all long term goals are 10 weeks  11/18/2022 )   1. Patient will demonstrate/report pain at worst less than or equal to 2/10 to facilitate minimal limitation in daily activity secondary to pain symptoms.  Goal status: not met 09/29/22   2. Patient will demonstrate independent use of home exercise program to facilitate ability to maintain/progress functional gains from skilled physical therapy services.  Goal status:not met 09/29/22   3. Patient will demonstrate FOTO outcome > or = 56 % to indicate reduced disability due to condition.  Goal  status: not met 09/29/22   4.  Patient will demonstrate Lt LE MMT 5/5 throughout to faciltiate usual transfers, stairs, squatting at [facility] for daily life.   Goal status: not met 09/29/22   5.  Patient will demonstrate ability to perform usual work/housework at [facility] s limitation.   Goal status: not met 09/29/22   6.  Patient will demonstrate bilateral SLS s symptoms 30 seconds.  Goal status; not met 09/29/22     PLAN:  PT FREQUENCY: 1-2x/week  PT DURATION: 10 weeks  PLANNED INTERVENTIONS: Therapeutic exercises, Therapeutic activity, Neuro Muscular re-education, Balance training, Gait training, Patient/Family education, Joint mobilization, Stair training, DME instructions, Dry Needling, Electrical stimulation, Traction, Cryotherapy, vasopneumatic deviceMoist heat, Taping, Ultrasound, Ionotophoresis 4mg /ml Dexamethasone, and aquatic therapy, Manual therapy.  All included unless contraindicated  PLAN  FOR NEXT SESSION: DC and she will have MRI and follow up with PT PRN.  Ivery Quale, PT, DPT 09/29/22 3:42 PM

## 2022-10-01 ENCOUNTER — Encounter: Payer: BC Managed Care – PPO | Admitting: Rehabilitative and Restorative Service Providers"

## 2022-10-06 ENCOUNTER — Encounter: Payer: BC Managed Care – PPO | Admitting: Rehabilitative and Restorative Service Providers"

## 2022-10-06 ENCOUNTER — Telehealth: Payer: Self-pay | Admitting: Sports Medicine

## 2022-10-06 NOTE — Telephone Encounter (Signed)
Pt would like her MRI order to be sent to [facility] [employer] already has the information needs to be electronically signed when being faxed over Fax number  [phone]

## 2022-10-07 NOTE — Telephone Encounter (Signed)
Referral signed and sent to [facility] with [facility]

## 2022-10-08 ENCOUNTER — Encounter: Payer: BC Managed Care – PPO | Admitting: Rehabilitative and Restorative Service Providers"

## 2022-10-09 ENCOUNTER — Other Ambulatory Visit: Payer: BC Managed Care – PPO

## 2022-10-13 ENCOUNTER — Telehealth: Payer: Self-pay | Admitting: *Deleted

## 2022-10-13 NOTE — Telephone Encounter (Signed)
Brooke Bonito approved via [place] [employer] [place] [phone], until 12/22/2022

## 2022-10-21 ENCOUNTER — Other Ambulatory Visit: Payer: BC Managed Care – PPO

## 2022-10-23 ENCOUNTER — Ambulatory Visit: Payer: BC Managed Care – PPO | Admitting: Sports Medicine

## 2022-10-28 ENCOUNTER — Encounter: Payer: Self-pay | Admitting: Sports Medicine

## 2022-10-28 ENCOUNTER — Ambulatory Visit: Payer: BC Managed Care – PPO | Admitting: Sports Medicine

## 2022-10-28 DIAGNOSIS — M722 Plantar fascial fibromatosis: Secondary | ICD-10-CM

## 2022-10-28 DIAGNOSIS — M357 Hypermobility syndrome: Secondary | ICD-10-CM | POA: Diagnosis not present

## 2022-10-28 DIAGNOSIS — S86302S Unspecified injury of muscle(s) and tendon(s) of peroneal muscle group at lower leg level, left leg, sequela: Secondary | ICD-10-CM

## 2022-10-28 DIAGNOSIS — M25572 Pain in left ankle and joints of left foot: Secondary | ICD-10-CM

## 2022-10-28 NOTE — Progress Notes (Signed)
Kimberly [name] - 52 y.o. female MRN [id number]  Date of birth: [birth year]  Office Visit Note: Visit Date: 10/28/2022 PCP: Pcp, No Referred by: No ref. provider found  Subjective: Chief Complaint  Patient presents with   Left Ankle - Follow-up, Pain    MRI review   HPI: Kimberly [name] is a pleasant 52 y.o. female who presents today for chronic left ankle pain and instability.  Here for MRI review. She has started formalized physical therapy, did not feel like she was getting much improvement from this.  She is wearing tall compression socks to help with swelling and pain control.  She has pain on a consistent basis, this will increase with activity.  Pain is over the medial and lateral aspect of the ankle.  And more recently she has been getting pain over the heel/plantar fascia as well.  She received only mild relief of her symptoms with a 6-day Medrol Dosepak most recently.  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  Has been told she is hypermobile in the past. She has rolled/sprained her left ankle many many times in the past.   Previous treatment:  - Did respond positively to extracorporeal shockwave therapy in the past but did not get permanent long-lasting relief - US-guided PT tendon injection without relief of pain  Pertinent ROS were reviewed with the patient and found to be negative unless otherwise specified above in HPI.   Assessment & Plan: Visit Diagnoses:  1. Pain in left ankle and joints of left foot   2. Benign joint hypermobility   3. Peroneal tendon injury, left, sequela   4. Plantar fasciitis, left    Plan: Discussed with Kimberly [name] the nature of her chronic left ankle pain.  She does have multiple pathology noted on exam and her MRI which shows peroneal tendinosis with likely a partial split tear and some fluid within the peroneal and posterior tibial tendon sheath.  More recently she is experiencing some plantar fasciitis which shows a edema at the  central insertion on MRI.  I think the origin of her issues is her chronic ankle laxity from her joint hypermobility.  She has been very adherent to both home therapy and formalized physical therapy, compression therapy, oral medications without much relief of her pain.  At this point, I do think she would need a surgical evaluation by a foot and ankle specialist, will send a referral to Kimberly [name].  Discussed with Kimberly [name] possible surgical options they could pursue, although given her joint hypermobility I am not sure what sort of permanent surgical fixation she can get in terms of ankle/ligament stability, will defer to Kimberly [name].  We will discontinue her methylprednisolone.  May take over-the-counter anti-inflammatories only as needed.  Recommended compression socks.  I would like her to continue her plantar fascia stretching/strengthening.  We can consider trials of extracorporeal shockwave therapy for the plantar fascia and her tendons, although would like further evaluation and treatment first.  She is agreeable to this plan.  Follow-up: Return for Referral to [name] for chronic left ankle pain and ligament laxity .   Meds & Orders: No orders of the defined types were placed in this encounter.   Orders Placed This Encounter  Procedures   Ambulatory referral to Orthopedic Surgery     Procedures: No procedures performed      Clinical History: No specialty comments available.  She reports that she has never smoked. She has never used smokeless tobacco.  No results for input(s): "HGBA1C", "LABURIC" in the last 8760 hours.  Objective:   Vital Signs: LMP 01/09/2017 (Exact Date)   Physical Exam  Gen: Well-appearing, in no acute distress; non-toxic CV: Well-perfused. Warm.  Resp: Breathing unlabored on room air; no wheezing. Psych: Fluid speech in conversation; appropriate affect; normal thought process Neuro: Sensation intact throughout. No gross coordination deficits.   Ortho Exam -  Left ankle: There is tenderness to palpation just posterior to the medial malleolus near the course of the posterior tibial tendon as well as the peroneal tendons on the lateral aspect of the ankle.  There is full range of motion with plantarflexion and dorsiflexion.  There is notable laxity with inversion/eversion testing about the ankle.  Positive TTP over the medial aspect of the plantar fascia insertion of the calcaneus.  No swelling redness or effusion about the ankle.  - Beighton Score last visit: 7/9 (thumb, pinky, elbows, bend & reach)   Imaging:  *MRI of the left ankle without contrast on 10/20/2022: -Mild posterior tibial tendon tenosynovitis and peroneus brevis tendinosis with likely a partial split tear and associated fluid within the sheath.  There is insertional thickening of the plantar fascia with increased signal, suggestive of probable small partial tear over the central calcaneal attachment.  There is no significant ankle or midfoot arthritic change.   Past Medical/Family/Surgical/Social History: Medications & Allergies reviewed per EMR, new medications updated. 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   Migraine without aura 09/13/2010   ANA positive 09/13/2010   Polyarthralgia 09/13/2010   Essential hypertension 01/11/2009   DE QUERVAIN'S TENOSYNOVITIS 01/26/2008   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   Past Medical History:  Diagnosis Date   Anorectal fistula    Anxiety    Arthritis    neck, hips   Chronic headaches    migraines   History of hypertension    since wt loss after gastric sleeve 2014--  no longer issue   Hx of adenomatous colonic polyps 03/28/2022   Hypertension    Irritable bowel syndrome    PONV (postoperative nausea and vomiting)    slow to wake up   Wears glasses    Family  History  Problem Relation Age of Onset   Stroke Mother    Diabetes Mother    Uterine cancer Mother 55   Cancer Mother        endometrial   Atrial fibrillation Mother    Colitis Father    Crohn's disease Father    Diabetes Father    Diverticulitis Father    Diabetes Sister    Uterine cancer Sister 57   Cancer Sister        endometrial   Irritable bowel syndrome Sister    Irritable bowel syndrome Brother    Ovarian cancer Paternal Grandmother    Cancer Paternal Grandmother 47       ovarian   Cancer Maternal Uncle        lung   Colon cancer Paternal Uncle    Past Surgical History:  Procedure Laterality Date   BREAST REDUCTION SURGERY  1996   CESAREAN SECTION  2001   COLONOSCOPY  10-18-2010   w/ bx's   ESOPHAGOGASTRODUODENOSCOPY N/A 06/29/2012   Procedure: ESOPHAGOGASTRODUODENOSCOPY (EGD);  Surgeon: Lodema Pilot, DO;  Location: [facility];  Service: General;  Laterality: N/A;   EVALUATION UNDER  ANESTHESIA WITH ANAL FISTULECTOMY N/A 04/14/2014   Procedure: ANAL EXAM UNDER ANESTHESIA WITH  FISTULOTOMY;  Surgeon: Romie Levee, MD;  Location: [facility];  Service: General;  Laterality: N/A;   LAPAROSCOPIC BILATERAL SALPINGECTOMY N/A 06/29/2012   Procedure: LAPAROSCOPIC BILATERAL SALPINGECTOMY;  Surgeon: Reva Bores, MD;  Location: [facility];  Service: Gynecology;  Laterality: N/A;   LAPAROSCOPIC GASTRIC SLEEVE RESECTION N/A 06/29/2012   Procedure: LAPAROSCOPIC GASTRIC SLEEVE RESECTION;  Surgeon: Lodema Pilot, DO;  Location: [facility];  Service: General;  Laterality: N/A;  Laparoscopic Sleeve Gastrectomy with EGD   REDUCTION MAMMAPLASTY Bilateral    VAGINAL HYSTERECTOMY N/A 01/13/2017   Procedure: HYSTERECTOMY VAGINAL;  Surgeon: Reva Bores, MD;  Location: [facility];  Service: Gynecology;  Laterality: N/A;   Social History   Occupational History   Occupation: Engineer, site  Tobacco Use   Smoking status: Never   Smokeless tobacco: Never  Vaping Use   Vaping status:  Never Used  Substance and Sexual Activity   Alcohol use: Yes    Comment: occasional   Drug use: No   Sexual activity: Yes    Birth control/protection: Surgical

## 2022-10-28 NOTE — Progress Notes (Signed)
Patient says that she was discharged from physical therapy until MRI results were back as she was not improving. She says she still does exercises on her own. Patient says that at rest her pain is constantly at a 4/10, and increased activity will result in increased pain.

## 2022-11-10 ENCOUNTER — Telehealth: Payer: Self-pay | Admitting: *Deleted

## 2022-11-10 ENCOUNTER — Other Ambulatory Visit: Payer: Self-pay | Admitting: *Deleted

## 2022-11-10 MED ORDER — BUSPIRONE HCL 10 MG PO TABS: 10.0000 mg | ORAL_TABLET | Freq: Three times a day (TID) | ORAL | 3 refills | Status: DC

## 2023-01-15 ENCOUNTER — Telehealth: Payer: Self-pay | Admitting: *Deleted

## 2023-01-15 NOTE — Telephone Encounter (Signed)
 Outcome- [employer] Approved on January 5 by [facility] 2017 Approved. Authorization Expiration Date: 01/08/2024

## 2023-01-30 NOTE — Therapy (Incomplete)
OUTPATIENT PHYSICAL THERAPY LOWER EXTREMITY EVALUATION   Patient Name: Kimberly [name] MRN: [id number] DOB:[birth year], 53 y.o., female Today's Date: 01/30/2023  END OF SESSION:   Past Medical History:  Diagnosis Date   Anorectal fistula    Anxiety    Arthritis    neck, hips   Chronic headaches    migraines   History of hypertension    since wt loss after gastric sleeve 2014--  no longer issue   Hx of adenomatous colonic polyps 03/28/2022   Hypertension    Irritable bowel syndrome    PONV (postoperative nausea and vomiting)    slow to wake up   Wears glasses    Past Surgical History:  Procedure Laterality Date   BREAST REDUCTION SURGERY  1996   CESAREAN SECTION  2001   COLONOSCOPY  10-18-2010   w/ bx's   ESOPHAGOGASTRODUODENOSCOPY N/A 06/29/2012   Procedure: ESOPHAGOGASTRODUODENOSCOPY (EGD);  Surgeon: Lodema Pilot, DO;  Location: [facility];  Service: General;  Laterality: N/A;   EVALUATION UNDER ANESTHESIA WITH ANAL FISTULECTOMY N/A 04/14/2014   Procedure: ANAL EXAM UNDER ANESTHESIA WITH  FISTULOTOMY;  Surgeon: Romie Levee, MD;  Location: [facility] ;  Service: General;  Laterality: N/A;   LAPAROSCOPIC BILATERAL SALPINGECTOMY N/A 06/29/2012   Procedure: LAPAROSCOPIC BILATERAL SALPINGECTOMY;  Surgeon: Reva Bores, MD;  Location: [facility];  Service: Gynecology;  Laterality: N/A;   LAPAROSCOPIC GASTRIC SLEEVE RESECTION N/A 06/29/2012   Procedure: LAPAROSCOPIC GASTRIC SLEEVE RESECTION;  Surgeon: Lodema Pilot, DO;  Location: [facility];  Service: General;  Laterality: N/A;  Laparoscopic Sleeve Gastrectomy with EGD   REDUCTION MAMMAPLASTY Bilateral    VAGINAL HYSTERECTOMY N/A 01/13/2017   Procedure: HYSTERECTOMY VAGINAL;  Surgeon: Reva Bores, MD;  Location: [facility];  Service: Gynecology;  Laterality: N/A;   Patient Active Problem List   Diagnosis Date Noted   Hx of adenomatous colonic polyps 03/28/2022   Muscle spasm 11/13/2017   Status post laparoscopic sleeve  gastrectomy 08/24/2013   Migraine without aura 09/13/2010   ANA positive 09/13/2010   Polyarthralgia 09/13/2010   Essential hypertension 01/11/2009   DE QUERVAIN'S TENOSYNOVITIS 01/26/2008   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    PCP: No PCP   REFERRING PROVIDER: Jannette Fogo, MD   REFERRING DIAG: Posterial tibial tendinitis of left lower extremity  THERAPY DIAG:  No diagnosis found.  Rationale for Evaluation and Treatment: Rehabilitation  ONSET DATE: 12/10/22  SUBJECTIVE:   SUBJECTIVE STATEMENT: ***  PERTINENT HISTORY: Patient with history of chronic L ankle instability. S/p Left ankle arthroscopy with debridement, lateral ligament reconstruction, possible open treatment syndesmosis, peroneal tendon repair, posterior tib tendon exploration and possible repair on 12/10/22.  PAIN:  Are you having pain? {OPRCPAIN:27236}  PRECAUTIONS: {Therapy precautions:24002}  RED FLAGS: {PT Red Flags:29287}   WEIGHT BEARING RESTRICTIONS: No  FALLS:  Has patient fallen in last 6 months? {fallsyesno:27318}  LIVING ENVIRONMENT: Lives with: {OPRC lives with:25569::"lives with their family"} Lives in: {Lives in:25570} Stairs: {opstairs:27293} Has following equipment at home: {Assistive devices:23999}  OCCUPATION: ***  PLOF: {PLOF:24004}  PATIENT GOALS: ***  NEXT MD VISIT: ***  OBJECTIVE:  Note: Objective measures were completed at Evaluation unless otherwise noted.  DIAGNOSTIC FINDINGS: N/A  PATIENT SURVEYS:  LEFS ***  COGNITION: Overall cognitive status: {cognition:24006}     SENSATION: {sensation:27233}  EDEMA:  {edema:24020}  MUSCLE LENGTH: Hamstrings: Right *** deg; Left *** deg Thomas test: Right *** deg; Left *** deg  POSTURE: {posture:25561}  PALPATION: ***  LOWER EXTREMITY  ROM:  {AROM/PROM:27142} ROM Right eval Left eval  Hip flexion    Hip extension    Hip abduction    Hip adduction    Hip internal rotation    Hip  external rotation    Knee flexion    Knee extension    Ankle dorsiflexion    Ankle plantarflexion    Ankle inversion    Ankle eversion     (Blank rows = not tested)  LOWER EXTREMITY MMT:  MMT Right eval Left eval  Hip flexion    Hip extension    Hip abduction    Hip adduction    Hip internal rotation    Hip external rotation    Knee flexion    Knee extension    Ankle dorsiflexion    Ankle plantarflexion    Ankle inversion    Ankle eversion     (Blank rows = not tested)  LOWER EXTREMITY SPECIAL TESTS:  {LEspecialtests:26242}  FUNCTIONAL TESTS:  {Functional tests:24029}  GAIT: Distance walked: *** Assistive device utilized: {Assistive devices:23999} Level of assistance: {Levels of assistance:24026} Comments: ***                                                                                                                                TREATMENT DATE: 01/30/23    PATIENT EDUCATION:  Education details: HEP, POC, goals  Person educated: Patient Education method: Programmer, multimedia, Demonstration, and Handouts Education comprehension: verbalized understanding and returned demonstration  HOME EXERCISE PROGRAM: ***  ASSESSMENT:  CLINICAL IMPRESSION: Patient is a 53 y.o. female who was seen today for physical therapy evaluation and treatment for ***.   OBJECTIVE IMPAIRMENTS: {opptimpairments:25111}.   ACTIVITY LIMITATIONS: {activitylimitations:27494}  PARTICIPATION LIMITATIONS: {participationrestrictions:25113}  PERSONAL FACTORS: {Personal factors:25162} are also affecting patient's functional outcome.   REHAB POTENTIAL: {rehabpotential:25112}  CLINICAL DECISION MAKING: {clinical decision making:25114}  EVALUATION COMPLEXITY: {Evaluation complexity:25115}   GOALS: Goals reviewed with patient? {yes/no:20286}  SHORT TERM GOALS: Target date: *** *** Baseline: Goal status: INITIAL  2.  *** Baseline:  Goal status: INITIAL  3.  *** Baseline:  Goal  status: INITIAL  4.  *** Baseline:  Goal status: INITIAL  5.  *** Baseline:  Goal status: INITIAL  6.  *** Baseline:  Goal status: INITIAL  LONG TERM GOALS: Target date: ***  *** Baseline:  Goal status: INITIAL  2.  *** Baseline:  Goal status: INITIAL  3.  *** Baseline:  Goal status: INITIAL  4.  *** Baseline:  Goal status: INITIAL  5.  *** Baseline:  Goal status: INITIAL  6.  *** Baseline:  Goal status: INITIAL   PLAN:  PT FREQUENCY: 1-2x/week  PT DURATION: 8 weeks  PLANNED INTERVENTIONS: {rehab planned interventions:25118::"97110-Therapeutic exercises","97530- Therapeutic [phone]- Neuromuscular re-education","97535- Self JXBJ","47829- Manual therapy"}  PLAN FOR NEXT SESSION: ***   Maylon Peppers, PT, DPT Physical Therapist - [place]  [facility]  01/30/2023, 3:02 PM

## 2023-02-02 ENCOUNTER — Ambulatory Visit: Payer: BC Managed Care – PPO | Attending: Orthopedic Surgery | Admitting: Physical Therapy

## 2023-02-02 ENCOUNTER — Encounter: Payer: Self-pay | Admitting: Physical Therapy

## 2023-02-02 DIAGNOSIS — R262 Difficulty in walking, not elsewhere classified: Secondary | ICD-10-CM | POA: Insufficient documentation

## 2023-02-02 DIAGNOSIS — M25572 Pain in left ankle and joints of left foot: Secondary | ICD-10-CM | POA: Insufficient documentation

## 2023-02-02 DIAGNOSIS — M6281 Muscle weakness (generalized): Secondary | ICD-10-CM | POA: Diagnosis present

## 2023-02-02 DIAGNOSIS — R600 Localized edema: Secondary | ICD-10-CM | POA: Insufficient documentation

## 2023-02-02 DIAGNOSIS — M25672 Stiffness of left ankle, not elsewhere classified: Secondary | ICD-10-CM | POA: Diagnosis present

## 2023-02-02 NOTE — Therapy (Signed)
OUTPATIENT PHYSICAL THERAPY LOWER EXTREMITY EVALUATION  Patient Name: Kimberly [name] MRN: [id number] DOB:20-May-1970, 53 y.o., female Today's Date: 02/02/2023  END OF SESSION:  PT End of Session - 02/02/23 1254     Visit Number 1    Number of Visits 12    Date for PT Re-Evaluation 03/16/23    PT Start Time 1006    PT Stop Time 1103    PT Time Calculation (min) 57 min            Past Medical History:  Diagnosis Date   Anorectal fistula    Anxiety    Arthritis    neck, hips   Chronic headaches    migraines   History of hypertension    since wt loss after gastric sleeve 2014--  no longer issue   Hx of adenomatous colonic polyps 03/28/2022   Hypertension    Irritable bowel syndrome    PONV (postoperative nausea and vomiting)    slow to wake up   Wears glasses    Past Surgical History:  Procedure Laterality Date   BREAST REDUCTION SURGERY  1996   CESAREAN SECTION  2001   COLONOSCOPY  10-18-2010   w/ bx's   ESOPHAGOGASTRODUODENOSCOPY N/A 06/29/2012   Procedure: ESOPHAGOGASTRODUODENOSCOPY (EGD);  Surgeon: Lodema Pilot, DO;  Location: [facility];  Service: General;  Laterality: N/A;   EVALUATION UNDER ANESTHESIA WITH ANAL FISTULECTOMY N/A 04/14/2014   Procedure: ANAL EXAM UNDER ANESTHESIA WITH  FISTULOTOMY;  Surgeon: Romie Levee, MD;  Location: [facility];  Service: General;  Laterality: N/A;   LAPAROSCOPIC BILATERAL SALPINGECTOMY N/A 06/29/2012   Procedure: LAPAROSCOPIC BILATERAL SALPINGECTOMY;  Surgeon: Reva Bores, MD;  Location: [facility];  Service: Gynecology;  Laterality: N/A;   LAPAROSCOPIC GASTRIC SLEEVE RESECTION N/A 06/29/2012   Procedure: LAPAROSCOPIC GASTRIC SLEEVE RESECTION;  Surgeon: Lodema Pilot, DO;  Location: [facility];  Service: General;  Laterality: N/A;  Laparoscopic Sleeve Gastrectomy with EGD   REDUCTION MAMMAPLASTY Bilateral    VAGINAL HYSTERECTOMY N/A 01/13/2017   Procedure: HYSTERECTOMY VAGINAL;  Surgeon: Reva Bores, MD;  Location: [facility];   Service: Gynecology;  Laterality: N/A;   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   Migraine without aura 09/13/2010   ANA positive 09/13/2010   Polyarthralgia 09/13/2010   Essential hypertension 01/11/2009   DE QUERVAIN'S TENOSYNOVITIS 01/26/2008   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   REFERRING PROVIDER: Jannette Fogo, MD  REFERRING DIAG:   Posterior tibial tendinitis of left lower extremity  Tear of peroneal tendon, left, initial encounter  Impingement syndrome of left ankle  THERAPY DIAG:   Pain in left ankle and joints of left foot  Muscle weakness (generalized)  Difficulty in walking, not elsewhere classified  Localized edema  Ankle joint stiffness, left  Rationale for Evaluation and Treatment: Rehabilitation  ONSET DATE: Surgery Date 12/10/2022  SUBJECTIVE:   SUBJECTIVE STATEMENT:   Pt. Arrives to PT clinic FWB and prepared for evaluation session. Patient reports they are 7 weeks s/p L lateral ankle surgery following an injury during a Zumba class. Pt. Expressed having a history of bilateral ankle sprains with cheerleading and gymnastics participation in their past. Pt. Notes having PT in past with benefit but not liking dry-needling after past PT treatments. 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 Pt. Highlights that they would like to be able to get back to their normal routines and be able to trust their ankle to support them. Pt. Describes that they are excited and ready to begin physical therapy.  PERTINENT HISTORY:  Signs and Symptoms concordant with EDS. PAIN:   Are you having pain? Yes: NPRS scale: 3/10 Pain location: Left Ankle Pain description: Dull/achy Aggravating factors: Long durations of  weightbearing, AROM dorsiflexion/inversion Relieving factors: Ice and rest  PRECAUTIONS: None  RED FLAGS:  Signs and Sx's concordant with EDS variant    WEIGHT BEARING RESTRICTIONS:  No  FALLS:   Has patient fallen in last 6 months? 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  Patient demonstrates rounded shoulders and forward head posture.  Patient demonstrates anterior pelvic tilt/ MCI of pelvic stabilization musculature.  Pt. Surgical sites are clean, sterile, and healing well.  Pt. Demonstrates decreased strength in proximal hip musculature  Pt. Demonstrates P with all AROM for L ankle. NPS 4/10  Note: Objective measures were completed at Evaluation unless otherwise noted.  DIAGNOSTIC FINDINGS:   L Lateral ankle ROM/Strength Deficit  PATIENT SURVEYS:    LEFS: TBD next tx.  COGNITION:  Overall cognitive status: Within functional limits for tasks assessed     SENSATION:  WFL  EDEMA:  Figure 8: R 53 cm/ L 60 cm  MUSCLE LENGTH:  NT  POSTURE: rounded shoulders  PALPATION:  Residual edema present in L lateral ankle.  Discussed scar massage/ incision healing.    LOWER EXTREMITY ROM:  Active ROM Right eval Left eval  Hip flexion    Hip extension    Hip abduction    Hip adduction    Hip internal rotation    Hip external rotation    Knee flexion    Knee extension    Ankle dorsiflexion 20 deg. 3 deg.  Ankle plantarflexion 50 deg. 40 deg.  Ankle inversion 30 deg. 18 deg.   Ankle eversion 20 deg. 8 deg.   (Blank rows = not tested)  LOWER  EXTREMITY MMT:  MMT Right eval Left eval  Hip flexion 3 3  Hip extension 4 4  Hip abduction 3 3  Hip adduction    Hip internal rotation 4 4  Hip external rotation 4 4  Knee flexion 4 4  Knee extension 5 5  Ankle dorsiflexion 4 3  Ankle plantarflexion 5 3  Ankle inversion 5 3  Ankle eversion 5 3   (Blank rows = not tested)  LOWER EXTREMITY SPECIAL TESTS:   NT  FUNCTIONAL TESTS:   AROM: Ankle Dorsiflexion  GAIT: Distance walked: 50 ft. In clinic Assistive device utilized: None Level of assistance: Complete Independence Comments: Slight antalgic gait favoring weightbearing through LLE.  TREATMENT DATE: 02/02/2023  See evaluation/ HEP (issued RTB)  PATIENT EDUCATION:  Education details: Pain management, scar management, activity limitations Person educated: Patient Education method: Explanation Education comprehension: verbalized understanding  HOME EXERCISE PROGRAM:  Access Code: [email or web address] URL: [email or web address] Date: 02/02/2023 Prepared by: Dorene Grebe  Exercises - Long Sitting Ankle Dorsiflexion with Anchored Resistance  - 1 x daily - 7 x weekly - 3 sets - 10 reps - Seated Ankle Eversion with Resistance  - 1 x daily - 7 x weekly - 3 sets - 10 reps - Seated Ankle Plantar Flexion with Resistance Loop  - 1 x daily - 7 x weekly - 3 sets - 10 reps - Seated Ankle Inversion with Anchored Resistance  - 1 x daily - 7 x weekly - 3 sets - 10 reps - Sidelying Hip Abduction with Resistance at Ankle  - 1 x daily - 7 x weekly - 3 sets - 10 reps - Side Stepping with Resistance at Thighs and Ankles  - 1 x daily - 7 x weekly - 3 sets - 10 reps  ASSESSMENT:  CLINICAL IMPRESSION: Patient is a 53 y.o. female who was seen today for physical therapy evaluation and treatment for s/p L lateral ankle surgery.  Pt. Presents with limited L ankle AROM/ strength and  ambulates with slight L antalgic gait pattern.  Pt. Has moderate L ankle edema present and good incision healing.  PT educated pt. On the importance of a consistent ex. Program/ daily scar massage.  Pt. Will benefit from skilled PT services to increase L ankle ROM/ strength to improve pain-free mobility/ return to work with no limitations.    OBJECTIVE IMPAIRMENTS: decreased mobility.   ACTIVITY LIMITATIONS: squatting  PARTICIPATION LIMITATIONS: occupation  PERSONAL FACTORS: Past/current experiences are also affecting patient's functional outcome.   REHAB POTENTIAL: Good  CLINICAL DECISION MAKING: Stable/uncomplicated  EVALUATION COMPLEXITY: Low   GOALS: Goals reviewed with patient? Yes  SHORT TERM GOALS: Target date: 02/23/2023  Patient will be able to demonstrate 200 ft. of gait corrected walking with NPS of 2/10 or less to improve community mobility.    Baseline: Goal status: INITIAL  2.  Patient independent with HEP to increase L ankle dorsiflexion AROM to >7 degrees to increase ground clearance during gait cycle. Baseline: 3 degrees Goal status: INITIAL   LONG TERM GOALS: Target date: 03/16/2023  Patient will be able to perform 10 bilateral forward lunges with NPS 0/10 to return to confidently performing functional ADLs/IADLs related to lifting/carrying objects. Baseline: NT Goal status: INITIAL  2.  Patient will demonstrate 15 degrees of L ankle dorsiflexion to increase ground clearance during gait and promote normalized gait pattern.  Baseline: 3 degrees Goal status: INITIAL  3.  Patient will increase proximal hip strength (ABD, ADD, IR/ER, EXT.) to 4/5 to increase global LE strength/stability. Baseline: 3/5 Goal status: INITIAL  4.  Patient will be able to jog in place for 2 minutes with no left ankle pain 0/10 NPS to promote return to gym based ex. Baseline:  Goal status: INITIAL  PLAN:  PT FREQUENCY: 2x/week  PT DURATION: 6 weeks  PLANNED  INTERVENTIONS: 97110-Therapeutic exercises, 97530- Therapeutic activity, [id number]- Neuromuscular re-education, 97535- Self Care, 91478- Manual therapy, and 97116- Gait training  PLAN FOR NEXT SESSION:   Incorporate proprioceptive exercises related to L lateral ankle instability, increase exercises variety focused on proximal hip strength.  LEFS outcome measure.    Cammie Mcgee, PT, DPT # [phone] 02/02/2023, 2:12 PM   Milford Cage,  SPT

## 2023-02-04 ENCOUNTER — Encounter: Payer: BC Managed Care – PPO | Admitting: Physical Therapy

## 2023-02-04 ENCOUNTER — Encounter: Payer: Self-pay | Admitting: Physical Therapy

## 2023-02-04 ENCOUNTER — Ambulatory Visit: Payer: BC Managed Care – PPO

## 2023-02-04 DIAGNOSIS — R600 Localized edema: Secondary | ICD-10-CM

## 2023-02-04 DIAGNOSIS — M25672 Stiffness of left ankle, not elsewhere classified: Secondary | ICD-10-CM

## 2023-02-04 DIAGNOSIS — M6281 Muscle weakness (generalized): Secondary | ICD-10-CM

## 2023-02-04 DIAGNOSIS — M25572 Pain in left ankle and joints of left foot: Secondary | ICD-10-CM | POA: Diagnosis not present

## 2023-02-04 DIAGNOSIS — R262 Difficulty in walking, not elsewhere classified: Secondary | ICD-10-CM

## 2023-02-04 NOTE — Therapy (Signed)
OUTPATIENT PHYSICAL THERAPY LOWER EXTREMITY TREATMENT  Patient Name: Kimberly [name] MRN: [id number] DOB:[birth year], 53 y.o., female Today's Date: 02/04/2023  END OF SESSION:  PT End of Session - 02/04/23 0957     Visit Number 2    Number of Visits 12    Date for PT Re-Evaluation 03/16/23    PT Start Time 0957    PT Stop Time 1046    PT Time Calculation (min) 49 min            Past Medical History:  Diagnosis Date   Anorectal fistula    Anxiety    Arthritis    neck, hips   Chronic headaches    migraines   History of hypertension    since wt loss after gastric sleeve 2014--  no longer issue   Hx of adenomatous colonic polyps 03/28/2022   Hypertension    Irritable bowel syndrome    PONV (postoperative nausea and vomiting)    slow to wake up   Wears glasses    Past Surgical History:  Procedure Laterality Date   BREAST REDUCTION SURGERY  1996   CESAREAN SECTION  2001   COLONOSCOPY  10-18-2010   w/ bx's   ESOPHAGOGASTRODUODENOSCOPY N/A 06/29/2012   Procedure: ESOPHAGOGASTRODUODENOSCOPY (EGD);  Surgeon: Lodema Pilot, DO;  Location: [facility];  Service: General;  Laterality: N/A;   EVALUATION UNDER ANESTHESIA WITH ANAL FISTULECTOMY N/A 04/14/2014   Procedure: ANAL EXAM UNDER ANESTHESIA WITH  FISTULOTOMY;  Surgeon: Romie Levee, MD;  Location: [facility] [place];  Service: General;  Laterality: N/A;   LAPAROSCOPIC BILATERAL SALPINGECTOMY N/A 06/29/2012   Procedure: LAPAROSCOPIC BILATERAL SALPINGECTOMY;  Surgeon: Reva Bores, MD;  Location: [facility];  Service: Gynecology;  Laterality: N/A;   LAPAROSCOPIC GASTRIC SLEEVE RESECTION N/A 06/29/2012   Procedure: LAPAROSCOPIC GASTRIC SLEEVE RESECTION;  Surgeon: Lodema Pilot, DO;  Location: [facility];  Service: General;  Laterality: N/A;  Laparoscopic Sleeve Gastrectomy with EGD   REDUCTION MAMMAPLASTY Bilateral    VAGINAL HYSTERECTOMY N/A 01/13/2017   Procedure: HYSTERECTOMY VAGINAL;  Surgeon: Reva Bores, MD;  Location: [facility];   Service: Gynecology;  Laterality: N/A;   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   Migraine without aura 09/13/2010   ANA positive 09/13/2010   Polyarthralgia 09/13/2010   Essential hypertension 01/11/2009   DE QUERVAIN'S TENOSYNOVITIS 01/26/2008   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   REFERRING PROVIDER: Jannette Fogo, MD  REFERRING DIAG:   Posterior tibial tendinitis of left lower extremity  Tear of peroneal tendon, left, initial encounter  Impingement syndrome of left ankle  THERAPY DIAG:   Pain in left ankle and joints of left foot  Muscle weakness (generalized)  Difficulty in walking, not elsewhere classified  Ankle joint stiffness, left  Localized edema  Rationale for Evaluation and Treatment: Rehabilitation  ONSET DATE: Surgery Date 12/10/2022  SUBJECTIVE:   SUBJECTIVE STATEMENT:   Pt. Arrives to PT clinic FWB and prepared for evaluation session. Patient reports they are 7 weeks s/p L lateral ankle surgery following an injury during a Zumba class. Pt. Expressed having a history of bilateral ankle sprains with cheerleading and gymnastics participation in their past. Pt. Notes having PT in past with benefit but not liking dry-needling after past PT treatments. 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 Pt. Highlights that they would like to be able to get back to their normal routines and be able to trust their ankle to support them. Pt. Describes that they are excited and ready to begin physical therapy.  PERTINENT HISTORY:  Signs and Symptoms concordant with EDS. PAIN:   Are you having pain? Yes: NPRS scale: 3/10 Pain location: Left Ankle Pain description: Dull/achy Aggravating factors: Long durations of  weightbearing, AROM dorsiflexion/inversion Relieving factors: Ice and rest  PRECAUTIONS: None  RED FLAGS:  Signs and Sx's concordant with EDS variant    WEIGHT BEARING RESTRICTIONS:  No  FALLS:   Has patient fallen in last 6 months? 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  Patient demonstrates rounded shoulders and forward head posture.  Patient demonstrates anterior pelvic tilt/ MCI of pelvic stabilization musculature.  Pt. Surgical sites are clean, sterile, and healing well.  Pt. Demonstrates decreased strength in proximal hip musculature  Pt. Demonstrates P with all AROM for L ankle. NPS 4/10  Note: Objective measures were completed at Evaluation unless otherwise noted.  DIAGNOSTIC FINDINGS:   L Lateral ankle ROM/Strength Deficit  PATIENT SURVEYS:    LEFS: 27/80  COGNITION:  Overall cognitive status: Within functional limits for tasks assessed     SENSATION:  WFL  EDEMA:  Figure 8: R 53 cm/ L 60 cm  MUSCLE LENGTH:  NT  POSTURE: rounded shoulders  PALPATION:  Residual edema present in L lateral ankle.  Discussed scar massage/ incision healing.    LOWER EXTREMITY ROM:  Active ROM Right eval Left eval  Hip flexion    Hip extension    Hip abduction    Hip adduction    Hip internal rotation    Hip external rotation    Knee flexion    Knee extension    Ankle dorsiflexion 20 deg. 3 deg.  Ankle plantarflexion 50 deg. 40 deg.  Ankle inversion 30 deg. 18 deg.   Ankle eversion 20 deg. 8 deg.   (Blank rows = not tested)  LOWER  EXTREMITY MMT:  MMT Right eval Left eval  Hip flexion 3 3  Hip extension 4 4  Hip abduction 3 3  Hip adduction    Hip internal rotation 4 4  Hip external rotation 4 4  Knee flexion 4 4  Knee extension 5 5  Ankle dorsiflexion 4 3  Ankle plantarflexion 5 3  Ankle inversion 5 3  Ankle eversion 5 3   (Blank rows = not tested)  LOWER EXTREMITY SPECIAL TESTS:   NT  FUNCTIONAL TESTS:   AROM: Ankle Dorsiflexion  GAIT: Distance walked: 50 ft. In clinic Assistive device utilized: None Level of assistance: Complete Independence Comments: Slight antalgic gait favoring weightbearing through LLE.  TREATMENT DATE: 02/04/2023  Subjective:  Pt. Present to PT session on time and in good spirits. Pt. Notes having a MD appointment next February 17th. Pt. Scores NPS on arrival as 5/10 with sharp pain. Pt. Describes that yesterday afternoon and that their ankle has been more irritable than before. Pt. Expresses they are returning to gym activities including 20 minutes on a recumbent bike and this may have increased their pain level.   Therapeutic Exercise:  NuStep 10 Minutes L3  GTB Ankle 4 ways anchored resistance, seated at plinth. RPE 3/10 and NPS 4/10  //-Bar BTB standing straight leg hip extension 3x15. RPE 4/10 and NPS 2/10.  //-Bar BTB standing straight leg hip abduction 3x15. RPE 4/10 and NPS 2/10.  Blaze Pods: Star pattern step-to foot proprioception activity on focus setting. 3 x 1 minute. RPE 7/10 and NPS 3/10  Manual Therapy:  STM to L ankle and leg musculature (gastrocnemius complex, trigger points present in posterior calf)  Grade II AP/PA subtalar mobs. 3x20 (Pain focus)   See evaluation/ HEP (issued RTB)  PATIENT EDUCATION:  Education details: Pain management, scar management, activity limitations Person educated: Patient Education method:  Explanation Education comprehension: verbalized understanding  HOME EXERCISE PROGRAM:  Access Code: [email or web address] URL: [email or web address] Date: 02/02/2023 Prepared by: Dorene Grebe  Exercises - Long Sitting Ankle Dorsiflexion with Anchored Resistance  - 1 x daily - 7 x weekly - 3 sets - 10 reps - Seated Ankle Eversion with Resistance  - 1 x daily - 7 x weekly - 3 sets - 10 reps - Seated Ankle Plantar Flexion with Resistance Loop  - 1 x daily - 7 x weekly - 3 sets - 10 reps - Seated Ankle Inversion with Anchored Resistance  - 1 x daily - 7 x weekly - 3 sets - 10 reps - Sidelying Hip Abduction with Resistance at Ankle  - 1 x daily - 7 x weekly - 3 sets - 10 reps - Side Stepping with Resistance at Thighs and Ankles  - 1 x daily - 7 x weekly - 3 sets - 10 reps  ASSESSMENT:  CLINICAL IMPRESSION: Patient is a 53 y.o. female who was seen today for physical therapy treatment for s/p L lateral ankle surgery.  Pt. Presents with limited L ankle AROM/ strength and continues to ambulate with slight L antalgic gait pattern. Pt. Has been managing residual ankle edema with compression stockings and is seeing noticeable reduction in swelling. PT provided education regarding muscle recruitment of the proximal hip stabilizers and activity modification to reduce pain severity over the weekend. Pt. Is highly motivated to return to ADLs and shows improvement in movement quality related to their left ankle. Pt. Will benefit from skilled PT services to increase L ankle ROM/ strength to improve pain-free mobility/ return to work with no limitations.    OBJECTIVE IMPAIRMENTS: decreased mobility.   ACTIVITY LIMITATIONS: squatting  PARTICIPATION LIMITATIONS: occupation  PERSONAL FACTORS: Past/current experiences are also affecting patient's functional outcome.   REHAB POTENTIAL: Good  CLINICAL DECISION MAKING: Stable/uncomplicated  EVALUATION COMPLEXITY: Low   GOALS: Goals reviewed with  patient? Yes  SHORT TERM GOALS: Target date: 02/23/2023  Patient will be able to demonstrate 200 ft. of gait corrected walking with NPS of 2/10 or less to improve community mobility.    Baseline: Goal status: INITIAL  2.  Patient independent with HEP to increase L ankle dorsiflexion AROM to >7 degrees to increase ground clearance during gait cycle. Baseline: 3 degrees Goal status: INITIAL  LONG TERM GOALS: Target date: 03/16/2023  Patient will be able to perform 10 bilateral forward lunges with NPS 0/10 to return to confidently performing functional ADLs/IADLs related to lifting/carrying objects. Baseline: NT Goal status: INITIAL  2.  Patient will demonstrate 15 degrees of L ankle dorsiflexion to increase ground clearance during gait and promote normalized gait pattern.  Baseline: 3 degrees Goal status: INITIAL  3.  Patient will increase proximal hip strength (ABD, ADD, IR/ER, EXT.) to 4/5 to increase global LE strength/stability. Baseline: 3/5 Goal status: INITIAL  4.  Patient will be able to jog in place for 2 minutes with no left ankle pain 0/10 NPS to promote return to gym based ex. Baseline:  Goal status: INITIAL  PLAN:  PT FREQUENCY: 2x/week  PT DURATION: 6 weeks  PLANNED INTERVENTIONS: 97110-Therapeutic exercises, 97530- Therapeutic activity, [id number]- Neuromuscular re-education, 97535- Self Care, [id number]- Manual therapy, and 97116- Gait training  PLAN FOR NEXT SESSION:   Incorporate proprioceptive exercises related to L lateral ankle instability, increase exercises variety focused on proximal hip strength.    Maylon Peppers, PT, DPT Physical Therapist - [facility]  02/04/2023, 11:07 AM   [place] Cage, SPT

## 2023-02-05 DIAGNOSIS — Z1231 Encounter for screening mammogram for malignant neoplasm of breast: Secondary | ICD-10-CM

## 2023-02-09 ENCOUNTER — Encounter: Payer: BC Managed Care – PPO | Admitting: Physical Therapy

## 2023-02-09 ENCOUNTER — Ambulatory Visit: Payer: BC Managed Care – PPO | Attending: Orthopedic Surgery | Admitting: Physical Therapy

## 2023-02-09 DIAGNOSIS — R600 Localized edema: Secondary | ICD-10-CM | POA: Diagnosis present

## 2023-02-09 DIAGNOSIS — M25672 Stiffness of left ankle, not elsewhere classified: Secondary | ICD-10-CM | POA: Diagnosis present

## 2023-02-09 DIAGNOSIS — M6281 Muscle weakness (generalized): Secondary | ICD-10-CM

## 2023-02-09 DIAGNOSIS — R262 Difficulty in walking, not elsewhere classified: Secondary | ICD-10-CM | POA: Diagnosis present

## 2023-02-09 DIAGNOSIS — M25572 Pain in left ankle and joints of left foot: Secondary | ICD-10-CM | POA: Diagnosis present

## 2023-02-09 NOTE — Therapy (Unsigned)
OUTPATIENT PHYSICAL THERAPY LOWER EXTREMITY TREATMENT  Patient Name: Kimberly [name] MRN: [id number] DOB:12-19-1970, 53 y.o., female Today's Date: 02/09/2023  END OF SESSION:  PT End of Session - 02/09/23 1342     Visit Number 3    Number of Visits 12    Date for PT Re-Evaluation 03/16/23    PT Start Time 1342    PT Stop Time 1434    PT Time Calculation (min) 52 min            Past Medical History:  Diagnosis Date   Anorectal fistula    Anxiety    Arthritis    neck, hips   Chronic headaches    migraines   History of hypertension    since wt loss after gastric sleeve 2014--  no longer issue   Hx of adenomatous colonic polyps 03/28/2022   Hypertension    Irritable bowel syndrome    PONV (postoperative nausea and vomiting)    slow to wake up   Wears glasses    Past Surgical History:  Procedure Laterality Date   BREAST REDUCTION SURGERY  1996   CESAREAN SECTION  2001   COLONOSCOPY  10-18-2010   w/ bx's   ESOPHAGOGASTRODUODENOSCOPY N/A 06/29/2012   Procedure: ESOPHAGOGASTRODUODENOSCOPY (EGD);  Surgeon: Lodema Pilot, DO;  Location: [facility];  Service: General;  Laterality: N/A;   EVALUATION UNDER ANESTHESIA WITH ANAL FISTULECTOMY N/A 04/14/2014   Procedure: ANAL EXAM UNDER ANESTHESIA WITH  FISTULOTOMY;  Surgeon: Romie Levee, MD;  Location: [facility];  Service: General;  Laterality: N/A;   LAPAROSCOPIC BILATERAL SALPINGECTOMY N/A 06/29/2012   Procedure: LAPAROSCOPIC BILATERAL SALPINGECTOMY;  Surgeon: Reva Bores, MD;  Location: [facility];  Service: Gynecology;  Laterality: N/A;   LAPAROSCOPIC GASTRIC SLEEVE RESECTION N/A 06/29/2012   Procedure: LAPAROSCOPIC GASTRIC SLEEVE RESECTION;  Surgeon: Lodema Pilot, DO;  Location: [facility];  Service: General;  Laterality: N/A;  Laparoscopic Sleeve Gastrectomy with EGD   REDUCTION MAMMAPLASTY Bilateral    VAGINAL HYSTERECTOMY N/A 01/13/2017   Procedure: HYSTERECTOMY VAGINAL;  Surgeon: Reva Bores, MD;  Location: [facility];   Service: Gynecology;  Laterality: N/A;   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   Migraine without aura 09/13/2010   ANA positive 09/13/2010   Polyarthralgia 09/13/2010   Essential hypertension 01/11/2009   DE QUERVAIN'S TENOSYNOVITIS 01/26/2008   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   REFERRING PROVIDER: Jannette Fogo, MD  REFERRING DIAG:   Posterior tibial tendinitis of left lower extremity  Tear of peroneal tendon, left, initial encounter  Impingement syndrome of left ankle  THERAPY DIAG:   Pain in left ankle and joints of left foot  Muscle weakness (generalized)  Difficulty in walking, not elsewhere classified  Ankle joint stiffness, left  Localized edema  Rationale for Evaluation and Treatment: Rehabilitation  ONSET DATE: Surgery Date 12/10/2022  SUBJECTIVE:   SUBJECTIVE STATEMENT:   Pt. Arrives to PT clinic FWB and prepared for evaluation session. Patient reports they are 7 weeks s/p L lateral ankle surgery following an injury during a Zumba class. Pt. Expressed having a history of bilateral ankle sprains with cheerleading and gymnastics participation in their past. Pt. Notes having PT in past with benefit but not liking dry-needling after past PT treatments. 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 Pt. Highlights that they would like to be able to get back to their normal routines and be able to trust their ankle to support them. Pt. Describes that they are excited and ready to begin physical therapy.  PERTINENT HISTORY:  Signs and Symptoms concordant with EDS. PAIN:   Are you having pain? Yes: NPRS scale: 3/10 Pain location: Left Ankle Pain description: Dull/achy Aggravating factors: Long durations of  weightbearing, AROM dorsiflexion/inversion Relieving factors: Ice and rest  PRECAUTIONS: None  RED FLAGS:  Signs and Sx's concordant with EDS variant    WEIGHT BEARING RESTRICTIONS:  No  FALLS:   Has patient fallen in last 6 months? 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  Patient demonstrates rounded shoulders and forward head posture.  Patient demonstrates anterior pelvic tilt/ MCI of pelvic stabilization musculature.  Pt. Surgical sites are clean, sterile, and healing well.  Pt. Demonstrates decreased strength in proximal hip musculature  Pt. Demonstrates P with all AROM for L ankle. NPS 4/10  Note: Objective measures were completed at Evaluation unless otherwise noted.  DIAGNOSTIC FINDINGS:   L Lateral ankle ROM/Strength Deficit  PATIENT SURVEYS:    LEFS: 27/80  COGNITION:  Overall cognitive status: Within functional limits for tasks assessed     SENSATION:  WFL  EDEMA:  Figure 8: R 53 cm/ L 60 cm  MUSCLE LENGTH:  NT  POSTURE: rounded shoulders  PALPATION:  Residual edema present in L lateral ankle.  Discussed scar massage/ incision healing.    LOWER EXTREMITY ROM:  Active ROM Right eval Left eval  Hip flexion    Hip extension    Hip abduction    Hip adduction    Hip internal rotation    Hip external rotation    Knee flexion    Knee extension    Ankle dorsiflexion 20 deg. 3 deg.  Ankle plantarflexion 50 deg. 40 deg.  Ankle inversion 30 deg. 18 deg.   Ankle eversion 20 deg. 8 deg.   (Blank rows = not tested)  LOWER  EXTREMITY MMT:  MMT Right eval Left eval  Hip flexion 3 3  Hip extension 4 4  Hip abduction 3 3  Hip adduction    Hip internal rotation 4 4  Hip external rotation 4 4  Knee flexion 4 4  Knee extension 5 5  Ankle dorsiflexion 4 3  Ankle plantarflexion 5 3  Ankle inversion 5 3  Ankle eversion 5 3   (Blank rows = not tested)  LOWER EXTREMITY SPECIAL TESTS:   NT  FUNCTIONAL TESTS:   AROM: Ankle Dorsiflexion  GAIT: Distance walked: 50 ft. In clinic Assistive device utilized: None Level of assistance: Complete Independence Comments: Slight antalgic gait favoring weightbearing through LLE.  TREATMENT DATE: 02/09/2023  Subjective:  Pt. Pain after one day of overduing it at the gym, sore the following several days,   Worst 7/10 the following day, hurt to walk again, resolved with RICE, points to soreness on anterior shin muscles,   Pt. Expresses that HEP is easy to follow  Pt. States they usually do their HEP at the gym   Return to work aiming for February 17th, mentally ready but worried about being on their feet all day, 4 solid hours on feet  Pt asks for guidance with return to work management related to pain, worried about going down stair, conservative with it.   8 weeks s/p today  Pt. Arrives to PT on time and exhibits gait favoring L ankle due to impingement symptoms. Pt. Remarks that they are at the 8 week s/p surgery mark this week. Pt. Notes having a MD appointment next February 11th and having a return to work date of February 17th. Pt. Scores NPS on arrival as 2/10 NPA with sharp/pinching pain. Pt. Describes that last Thursday they "may have overdone it at the gym" and had to rest for a majority of Friday due to 7/10 NPS. Pt. Expresses they are concerned about managing their pain while at work because of a four hour shift of prolonged standing during their work  day. Pt. Notes that they have been compliant with their current HEP and that they do it while at the gym.    Therapeutic Exercise:  Staircase standing B gastroc stretch  Staircase FFE Lunge 12" step. 4 sets with 1 minute continuous repetitions. Bilaterally.  Total Gym squats L22. Full ROM.   GTB Ankle 4 ways anchored resistance, seated at plinth. RPE 3/10 and NPS 4/10  //-Bar BTB standing straight leg hip extension 3x15. RPE 4/10 and NPS 2/10.  //-Bar BTB standing straight leg hip abduction 3x15. RPE 4/10 and NPS 2/10.  Blaze Pods: Star pattern step-to foot proprioception activity on focus setting. 3 x 1 minute. RPE 5/10 and NPS 3  /10    See evaluation/ HEP (issued RTB)  PATIENT EDUCATION:  Education details: Pain management, scar management, activity limitations Person educated: Patient Education method: Explanation Education comprehension: verbalized understanding  HOME EXERCISE PROGRAM:  Access Code: [email or web address] URL: [email or web address] Date: 02/02/2023 Prepared by: Dorene Grebe  Exercises - Long Sitting Ankle Dorsiflexion with Anchored Resistance  - 1 x daily - 7 x weekly - 3 sets - 10 reps - Seated Ankle Eversion with Resistance  - 1 x daily - 7 x weekly - 3 sets - 10 reps - Seated Ankle Plantar Flexion with Resistance Loop  - 1 x daily - 7 x weekly - 3 sets - 10 reps - Seated Ankle Inversion with Anchored Resistance  - 1 x daily - 7 x weekly - 3 sets - 10 reps - Sidelying Hip Abduction with Resistance at Ankle  - 1 x daily - 7 x weekly - 3 sets - 10 reps - Side Stepping with Resistance at Thighs and Ankles  - 1 x daily - 7 x weekly - 3 sets - 10 reps  ASSESSMENT:  CLINICAL IMPRESSION: Patient is a 53 y.o. female who was seen today for physical therapy treatment for s/p L lateral ankle surgery.  Today's treatment session focused on progressing current therapeutic exercises to further challenge proximal hip and ankle stabilizing muscle groups. Pt.  Presents with limited L ankle AROM/ strength and continues to ambulate with slight L antalgic gait pattern. Pt. Has minimal residual  surgical swelling of L ankle. PT provided education regarding pain management at work and how they can incorporate ROM exercise to decrease impingement symptoms caused by increased weight-bearing durations/episodes of acute swelling. Pt. Is highly motivated to return to ADLs and shows improvement in movement quality related to their left ankle and proximal hip stabilizers. Pt. Will benefit from skilled PT services to increase L ankle ROM/ strength to improve pain-free mobility/ return to work with no limitations.    OBJECTIVE IMPAIRMENTS: decreased mobility.   ACTIVITY LIMITATIONS: squatting  PARTICIPATION LIMITATIONS: occupation  PERSONAL FACTORS: Past/current experiences are also affecting patient's functional outcome.   REHAB POTENTIAL: Good  CLINICAL DECISION MAKING: Stable/uncomplicated  EVALUATION COMPLEXITY: Low   GOALS: Goals reviewed with patient? Yes  SHORT TERM GOALS: Target date: 02/23/2023  Patient will be able to demonstrate 200 ft. of gait corrected walking with NPS of 2/10 or less to improve community mobility.    Baseline: Goal status: INITIAL  2.  Patient independent with HEP to increase L ankle dorsiflexion AROM to >7 degrees to increase ground clearance during gait cycle. Baseline: 3 degrees Goal status: INITIAL   LONG TERM GOALS: Target date: 03/16/2023  Patient will be able to perform 10 bilateral forward lunges with NPS 0/10 to return to confidently performing functional ADLs/IADLs related to lifting/carrying objects. Baseline: NT Goal status: INITIAL  2.  Patient will demonstrate 15 degrees of L ankle dorsiflexion to increase ground clearance during gait and promote normalized gait pattern.  Baseline: 3 degrees Goal status: INITIAL  3.  Patient will increase proximal hip strength (ABD, ADD, IR/ER, EXT.) to 4/5 to increase  global LE strength/stability. Baseline: 3/5 Goal status: INITIAL  4.  Patient will be able to jog in place for 2 minutes with no left ankle pain 0/10 NPS to promote return to gym based ex. Baseline:  Goal status: INITIAL  PLAN:  PT FREQUENCY: 2x/week  PT DURATION: 6 weeks  PLANNED INTERVENTIONS: 97110-Therapeutic exercises, 97530- Therapeutic activity, [id number]- Neuromuscular re-education, 97535- Self Care, [id number]- Manual therapy, and 97116- Gait training  PLAN FOR NEXT SESSION:   Incorporate proprioceptive exercises related to L lateral ankle instability, increase exercises variety focused on proximal hip strength.    Maylon Peppers, PT, DPT Physical Therapist - [facility]  02/09/2023, 4:08 PM   Milford Cage, SPT

## 2023-02-11 ENCOUNTER — Encounter: Payer: BC Managed Care – PPO | Admitting: Physical Therapy

## 2023-02-11 ENCOUNTER — Other Ambulatory Visit: Payer: Self-pay | Admitting: Family Medicine

## 2023-02-11 ENCOUNTER — Encounter: Payer: Self-pay | Admitting: Physical Therapy

## 2023-02-11 ENCOUNTER — Ambulatory Visit: Payer: BC Managed Care – PPO | Admitting: Physical Therapy

## 2023-02-11 DIAGNOSIS — M6281 Muscle weakness (generalized): Secondary | ICD-10-CM

## 2023-02-11 DIAGNOSIS — Z1231 Encounter for screening mammogram for malignant neoplasm of breast: Secondary | ICD-10-CM

## 2023-02-11 DIAGNOSIS — R262 Difficulty in walking, not elsewhere classified: Secondary | ICD-10-CM

## 2023-02-11 DIAGNOSIS — M25672 Stiffness of left ankle, not elsewhere classified: Secondary | ICD-10-CM

## 2023-02-11 DIAGNOSIS — M25572 Pain in left ankle and joints of left foot: Secondary | ICD-10-CM | POA: Diagnosis not present

## 2023-02-11 DIAGNOSIS — R600 Localized edema: Secondary | ICD-10-CM

## 2023-02-11 NOTE — Therapy (Signed)
 OUTPATIENT PHYSICAL THERAPY LOWER EXTREMITY TREATMENT  Patient Name: Kimberly [name] MRN: [id number] DOB:03-Jul-1970, 53 y.o., female Today's Date: 02/11/2023  END OF SESSION:  PT End of Session - 02/11/23 0954     Visit Number 4    Number of Visits 12    Date for PT Re-Evaluation 03/16/23    PT Start Time 0946    PT Stop Time 1034    PT Time Calculation (min) 48 min            Past Medical History:  Diagnosis Date   Anorectal fistula    Anxiety    Arthritis    neck, hips   Chronic headaches    migraines   History of hypertension    since wt loss after gastric sleeve 2014--  no longer issue   Hx of adenomatous colonic polyps 03/28/2022   Hypertension    Irritable bowel syndrome    PONV (postoperative nausea and vomiting)    slow to wake up   Wears glasses    Past Surgical History:  Procedure Laterality Date   BREAST REDUCTION SURGERY  1996   CESAREAN SECTION  2001   COLONOSCOPY  10-18-2010   w/ bx's   ESOPHAGOGASTRODUODENOSCOPY N/A 06/29/2012   Procedure: ESOPHAGOGASTRODUODENOSCOPY (EGD);  Surgeon: Redell Faith, DO;  Location: [facility];  Service: General;  Laterality: N/A;   EVALUATION UNDER ANESTHESIA WITH ANAL FISTULECTOMY N/A 04/14/2014   Procedure: ANAL EXAM UNDER ANESTHESIA WITH  FISTULOTOMY;  Surgeon: Bernarda Ned, MD;  Location: [facility];  Service: General;  Laterality: N/A;   LAPAROSCOPIC BILATERAL SALPINGECTOMY N/A 06/29/2012   Procedure: LAPAROSCOPIC BILATERAL SALPINGECTOMY;  Surgeon: Glenys GORMAN Birk, MD;  Location: [facility];  Service: Gynecology;  Laterality: N/A;   LAPAROSCOPIC GASTRIC SLEEVE RESECTION N/A 06/29/2012   Procedure: LAPAROSCOPIC GASTRIC SLEEVE RESECTION;  Surgeon: Redell Faith, DO;  Location: [facility];  Service: General;  Laterality: N/A;  Laparoscopic Sleeve Gastrectomy with EGD   REDUCTION MAMMAPLASTY Bilateral    VAGINAL HYSTERECTOMY N/A 01/13/2017   Procedure: HYSTERECTOMY VAGINAL;  Surgeon: Birk Glenys GORMAN, MD;  Location: [facility];   Service: Gynecology;  Laterality: N/A;   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   Migraine without aura 09/13/2010   ANA positive 09/13/2010   Polyarthralgia 09/13/2010   Essential hypertension 01/11/2009   DE QUERVAIN'S TENOSYNOVITIS 01/26/2008   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   REFERRING PROVIDER: Gaile Murray NOVAK, MD  REFERRING DIAG:   Posterior tibial tendinitis of left lower extremity  Tear of peroneal tendon, left, initial encounter  Impingement syndrome of left ankle  THERAPY DIAG:   Pain in left ankle and joints of left foot  Muscle weakness (generalized)  Ankle joint stiffness, left  Difficulty in walking, not elsewhere classified  Localized edema  Rationale for Evaluation and Treatment: Rehabilitation  ONSET DATE: Surgery Date 12/10/2022  SUBJECTIVE:   SUBJECTIVE STATEMENT:   Pt. Arrives to PT clinic FWB and prepared for evaluation session. Patient reports they are 7 weeks s/p L lateral ankle surgery following an injury during a Zumba class. Pt. Expressed having a history of bilateral ankle sprains with cheerleading and gymnastics participation in their past. Pt. Notes having PT in past with benefit but not liking dry-needling after past PT treatments. Pt. 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 Pt. Highlights that they would like to be able to get back to their normal routines and be able to trust their ankle to support them. Pt. Describes that they are excited and ready to begin physical therapy.  PERTINENT HISTORY:  Signs and Symptoms concordant with EDS. PAIN:   Are you having pain? Yes: NPRS scale: 3/10 Pain location: Left Ankle Pain description: Dull/achy Aggravating factors: Long durations of  weightbearing, AROM dorsiflexion/inversion Relieving factors: Ice and rest  PRECAUTIONS: None  RED FLAGS:  Signs and Sx's concordant with EDS variant    WEIGHT BEARING RESTRICTIONS:  No  FALLS:   Has patient fallen in last 6 months? 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  Patient demonstrates rounded shoulders and forward head posture.  Patient demonstrates anterior pelvic tilt/ MCI of pelvic stabilization musculature.  Pt. Surgical sites are clean, sterile, and healing well.  Pt. Demonstrates decreased strength in proximal hip musculature  Pt. Demonstrates P with all AROM for L ankle. NPS 4/10  Note: Objective measures were completed at Evaluation unless otherwise noted.  DIAGNOSTIC FINDINGS:   L Lateral ankle ROM/Strength Deficit  PATIENT SURVEYS:    LEFS: 27/80  COGNITION:  Overall cognitive status: Within functional limits for tasks assessed     SENSATION:  WFL  EDEMA:  Figure 8: R 53 cm/ L 60 cm  MUSCLE LENGTH:  NT  POSTURE: rounded shoulders  PALPATION:  Residual edema present in L lateral ankle.  Discussed scar massage/ incision healing.    LOWER EXTREMITY ROM:  Active ROM Right eval Left eval  Hip flexion    Hip extension    Hip abduction    Hip adduction    Hip internal rotation    Hip external rotation    Knee flexion    Knee extension    Ankle dorsiflexion 20 deg. 3 deg.  Ankle plantarflexion 50 deg. 40 deg.  Ankle inversion 30 deg. 18 deg.   Ankle eversion 20 deg. 8 deg.   (Blank rows = not tested)  LOWER  EXTREMITY MMT:  MMT Right eval Left eval  Hip flexion 3 3  Hip extension 4 4  Hip abduction 3 3  Hip adduction    Hip internal rotation 4 4  Hip external rotation 4 4  Knee flexion 4 4  Knee extension 5 5  Ankle dorsiflexion 4 3  Ankle plantarflexion 5 3  Ankle inversion 5 3  Ankle eversion 5 3   (Blank rows = not tested)  LOWER EXTREMITY SPECIAL TESTS:   NT  FUNCTIONAL TESTS:   AROM: Ankle Dorsiflexion  GAIT: Distance walked: 50 ft. In clinic Assistive device utilized: None Level of assistance: Complete Independence Comments: Slight antalgic gait favoring weightbearing through LLE.  TREATMENT DATE: 02/11/2023  Subjective:  Pt. Arrives to PT on time and exhibits gait favoring L ankle and states she has more soreness than they would like to today. Pt. 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 Pt. Scores NPS on arrival as 5/10 NPs with sharp/pinching pain. Pt. Describes having a weather related migraine this morning. Pt. Expresses that they have been compliant with their current HEP and that they are able to it while at the gym.    Therapeutic Exercise:  2nd step: standing B gastroc stretches with holds  Staircase FFE Lunge 12 step. 4 sets with 1 minute continuous repetitions. Bilaterally.  Pro-Stretch 2 Minutes continuously  Total Gym squats 3x15 L22. Full ROM.   Nu-Step 8 Minutes. L3 RPE 4/10 and NPS 5/10/  There.act.:  //-Bar AirEx Balance beam tandem stance walking. 10 laps   //-Bar BTB standing straight leg hip extension 3x15. RPE 4/10 and NPS 2/10.  Blaze Pods: Star pattern step-to foot proprioception activity on focus setting with GTB above knees. 3 x 1 minute. RPE 6/10 and NPS 3  /10   PATIENT EDUCATION:  Education details: Pain management, scar management, activity limitations Person  educated: Patient Education method: Explanation Education comprehension: verbalized understanding  HOME EXERCISE PROGRAM:  Access Code: [email or web address] URL: [email or web address] Date: 02/02/2023 Prepared by: Ozell Sero  Exercises - Long Sitting Ankle Dorsiflexion with Anchored Resistance  - 1 x daily - 7 x weekly - 3 sets - 10 reps - Seated Ankle Eversion with Resistance  - 1 x daily - 7 x weekly - 3 sets - 10 reps - Seated Ankle Plantar Flexion with Resistance Loop  - 1 x daily - 7 x weekly - 3 sets - 10 reps - Seated Ankle Inversion with Anchored Resistance  - 1 x daily - 7 x weekly - 3 sets - 10 reps - Sidelying Hip Abduction with Resistance at Ankle  - 1 x daily - 7 x weekly - 3 sets - 10 reps - Side Stepping with Resistance at Thighs and Ankles  - 1 x daily - 7 x weekly - 3 sets - 10 reps  ASSESSMENT:  CLINICAL IMPRESSION:  Today's treatment session focused on progressing current therapeutic exercises to further challenge LE proprioception. Pt. Presents with limited L ankle AROM/ strength and continues to ambulate with slight L antalgic gait pattern. Pt. Is demonstrating progress with blaze-pod activities and were able to add resistance at the knees using GTB. Pt. Expressed having some pinching pain NPS 5/10 halfway through their treatment session related to fatigue. Pt. Has also been abe to demonstrate anterior pelvic tilt correction with verbal cues rather than demonstration by PT. Pt. 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 Pt. Will benefit from skilled PT services to increase L ankle ROM/ strength to improve pain-free mobility/ return to work with no limitations.    OBJECTIVE IMPAIRMENTS: decreased mobility.   ACTIVITY LIMITATIONS: squatting  PARTICIPATION LIMITATIONS: occupation  PERSONAL FACTORS: Past/current experiences are also affecting patient's functional outcome.    REHAB POTENTIAL: Good  CLINICAL DECISION MAKING: Stable/uncomplicated  EVALUATION COMPLEXITY: Low   GOALS: Goals reviewed with patient? Yes  SHORT TERM GOALS: Target date: 02/23/2023  Patient will be able to demonstrate 200 ft. of gait corrected walking with NPS of 2/10 or less to improve community mobility.    Baseline: Goal status: INITIAL  2.  Patient independent with HEP to  increase L ankle dorsiflexion AROM to >7 degrees to increase ground clearance during gait cycle. Baseline: 3 degrees Goal status: INITIAL   LONG TERM GOALS: Target date: 03/16/2023  Patient will be able to perform 10 bilateral forward lunges with NPS 0/10 to return to confidently performing functional ADLs/IADLs related to lifting/carrying objects. Baseline: NT Goal status: INITIAL  2.  Patient will demonstrate 15 degrees of L ankle dorsiflexion to increase ground clearance during gait and promote normalized gait pattern.  Baseline: 3 degrees Goal status: INITIAL  3.  Patient will increase proximal hip strength (ABD, ADD, IR/ER, EXT.) to 4/5 to increase global LE strength/stability. Baseline: 3/5 Goal status: INITIAL  4.  Patient will be able to jog in place for 2 minutes with no left ankle pain 0/10 NPS to promote return to gym based ex. Baseline:  Goal status: INITIAL  PLAN:  PT FREQUENCY: 2x/week  PT DURATION: 6 weeks  PLANNED INTERVENTIONS: 97110-Therapeutic exercises, 97530- Therapeutic activity, [id number]- Neuromuscular re-education, 97535- Self Care, [id number]- Manual therapy, and 97116- Gait training  PLAN FOR NEXT SESSION: Progress proximal hip stabilization HEP exercises.  Update STGs  Ozell JAYSON Sero, PT, DPT # [id number] Physical Therapist - [facility]  02/11/2023, 12:53 PM   Beverley Bunker, SPT

## 2023-02-16 ENCOUNTER — Ambulatory Visit: Payer: BC Managed Care – PPO | Admitting: Physical Therapy

## 2023-02-16 ENCOUNTER — Encounter: Payer: BC Managed Care – PPO | Admitting: Physical Therapy

## 2023-02-16 ENCOUNTER — Encounter: Payer: Self-pay | Admitting: Physical Therapy

## 2023-02-16 DIAGNOSIS — M6281 Muscle weakness (generalized): Secondary | ICD-10-CM

## 2023-02-16 DIAGNOSIS — R262 Difficulty in walking, not elsewhere classified: Secondary | ICD-10-CM

## 2023-02-16 DIAGNOSIS — M25672 Stiffness of left ankle, not elsewhere classified: Secondary | ICD-10-CM

## 2023-02-16 DIAGNOSIS — M25572 Pain in left ankle and joints of left foot: Secondary | ICD-10-CM | POA: Diagnosis not present

## 2023-02-16 DIAGNOSIS — R600 Localized edema: Secondary | ICD-10-CM

## 2023-02-16 NOTE — Therapy (Signed)
 OUTPATIENT PHYSICAL THERAPY LOWER EXTREMITY TREATMENT  Patient Name: Kimberly [name] MRN: [id number] DOB:11-19-1970, 53 y.o., female Today's Date: 02/16/2023  END OF SESSION:  PT End of Session - 02/16/23 0901     Visit Number 5    Number of Visits 12    Date for PT Re-Evaluation 03/16/23    PT Start Time 0900    PT Stop Time 0948    PT Time Calculation (min) 48 min            Past Medical History:  Diagnosis Date   Anorectal fistula    Anxiety    Arthritis    neck, hips   Chronic headaches    migraines   History of hypertension    since wt loss after gastric sleeve 2014--  no longer issue   Hx of adenomatous colonic polyps 03/28/2022   Hypertension    Irritable bowel syndrome    PONV (postoperative nausea and vomiting)    slow to wake up   Wears glasses    Past Surgical History:  Procedure Laterality Date   BREAST REDUCTION SURGERY  1996   CESAREAN SECTION  2001   COLONOSCOPY  10-18-2010   w/ bx's   ESOPHAGOGASTRODUODENOSCOPY N/A 06/29/2012   Procedure: ESOPHAGOGASTRODUODENOSCOPY (EGD);  Surgeon: Evander Hills, DO;  Location: [facility];  Service: General;  Laterality: N/A;   EVALUATION UNDER ANESTHESIA WITH ANAL FISTULECTOMY N/A 04/14/2014   Procedure: ANAL EXAM UNDER ANESTHESIA WITH  FISTULOTOMY;  Surgeon: Joyce Nixon, MD;  Location: [facility];  Service: General;  Laterality: N/A;   LAPAROSCOPIC BILATERAL SALPINGECTOMY N/A 06/29/2012   Procedure: LAPAROSCOPIC BILATERAL SALPINGECTOMY;  Surgeon: Granville Layer, MD;  Location: [facility];  Service: Gynecology;  Laterality: N/A;   LAPAROSCOPIC GASTRIC SLEEVE RESECTION N/A 06/29/2012   Procedure: LAPAROSCOPIC GASTRIC SLEEVE RESECTION;  Surgeon: Evander Hills, DO;  Location: [facility];  Service: General;  Laterality: N/A;  Laparoscopic Sleeve Gastrectomy with EGD   REDUCTION MAMMAPLASTY Bilateral    VAGINAL HYSTERECTOMY N/A 01/13/2017   Procedure: HYSTERECTOMY VAGINAL;  Surgeon: Granville Layer, MD;  Location: [facility];   Service: Gynecology;  Laterality: N/A;   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   Migraine without aura 09/13/2010   ANA positive 09/13/2010   Polyarthralgia 09/13/2010   Essential hypertension 01/11/2009   DE QUERVAIN'S TENOSYNOVITIS 01/26/2008   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   REFERRING PROVIDER: Fawn Hooks, MD  REFERRING DIAG:   Posterior tibial tendinitis of left lower extremity  Tear of peroneal tendon, left, initial encounter  Impingement syndrome of left ankle  THERAPY DIAG:   Pain in left ankle and joints of left foot  Muscle weakness (generalized)  Ankle joint stiffness, left  Difficulty in walking, not elsewhere classified  Localized edema  Rationale for Evaluation and Treatment: Rehabilitation  ONSET DATE: Surgery Date 12/10/2022  SUBJECTIVE:   SUBJECTIVE STATEMENT:   Pt. Arrives to PT clinic FWB and prepared for evaluation session. Patient reports they are 7 weeks s/p L lateral ankle surgery following an injury during a Zumba class. Pt. Expressed having a history of bilateral ankle sprains with cheerleading and gymnastics participation in their past. Pt. Notes having PT in past with benefit but not liking dry-needling after past PT treatments. 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 Pt. Highlights that they would like to be able to get back to their normal routines and be able to trust their ankle to support them. Pt. Describes that they are excited and ready to begin physical therapy.  PERTINENT HISTORY:  Signs and Symptoms concordant with EDS. PAIN:   Are you having pain? Yes: NPRS scale: 3/10 Pain location: Left Ankle Pain description: Dull/achy Aggravating factors: Long durations of  weightbearing, AROM dorsiflexion/inversion Relieving factors: Ice and rest  PRECAUTIONS: None  RED FLAGS:  Signs and Sx's concordant with EDS variant    WEIGHT BEARING RESTRICTIONS:  No  FALLS:   Has patient fallen in last 6 months? 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  Patient demonstrates rounded shoulders and forward head posture.  Patient demonstrates anterior pelvic tilt/ MCI of pelvic stabilization musculature.  Pt. Surgical sites are clean, sterile, and healing well.  Pt. Demonstrates decreased strength in proximal hip musculature  Pt. Demonstrates P with all AROM for L ankle. NPS 4/10  Note: Objective measures were completed at Evaluation unless otherwise noted.  DIAGNOSTIC FINDINGS:   L Lateral ankle ROM/Strength Deficit  PATIENT SURVEYS:    LEFS: 27/80  COGNITION:  Overall cognitive status: Within functional limits for tasks assessed     SENSATION:  WFL  EDEMA:  Figure 8: R 53 cm/ L 60 cm  MUSCLE LENGTH:  NT  POSTURE: rounded shoulders  PALPATION:  Residual edema present in L lateral ankle.  Discussed scar massage/ incision healing.    LOWER EXTREMITY ROM:  Active ROM Right eval Left eval  Hip flexion    Hip extension    Hip abduction    Hip adduction    Hip internal rotation    Hip external rotation    Knee flexion    Knee extension    Ankle dorsiflexion 20 deg. 3 deg.  Ankle plantarflexion 50 deg. 40 deg.  Ankle inversion 30 deg. 18 deg.   Ankle eversion 20 deg. 8 deg.   (Blank rows = not tested)  LOWER  EXTREMITY MMT:  MMT Right eval Left eval  Hip flexion 3 3  Hip extension 4 4  Hip abduction 3 3  Hip adduction    Hip internal rotation 4 4  Hip external rotation 4 4  Knee flexion 4 4  Knee extension 5 5  Ankle dorsiflexion 4 3  Ankle plantarflexion 5 3  Ankle inversion 5 3  Ankle eversion 5 3   (Blank rows = not tested)  LOWER EXTREMITY SPECIAL TESTS:   NT  FUNCTIONAL TESTS:   AROM: Ankle Dorsiflexion  GAIT: Distance walked: 50 ft. In clinic Assistive device utilized: None Level of assistance: Complete Independence Comments: Slight antalgic gait favoring weightbearing through LLE.  TREATMENT DATE: 02/16/2023  Subjective:  Pt. Arrives to PT with 2/10 L lateral ankle discomfort.   Pt. Returns to MD tomorrow and scheduled to return to work in a week.  Pt. Expresses that they have been compliant with their current HEP and they are able to complete gym ex.   Therapeutic Exercise:  Nustep 10 Minutes. L4 RPE 4/10 and NPS 2/10 L ankle.  B UE/LE for 7 min. And B LE only for last 3 min.    Walking in hallway (varying cadences)- added high marching 2 laps.  Improved heel strike/ toe off on L.  Slight increase in L antalgic gait with increase cadence.    Staircase FFE Lunge 12" step. 4 sets with 1 minute continuous repetitions. Bilaterally.  Eccentric step down on 6" step.    Reassessment of L ankle AROM in supine:  DF: 10 deg./ PF: 58 deg. (Marked increase)/ IV: 36 deg./ EV: 16 deg.    There.act.:  Blaze Pods: Star pattern step-to foot proprioception activity on focus setting with RTB at ankles. 3 x 1.5 minute.  NPS 2-3/10  //-Bar AirEx Balance beam tandem stance walking (multi-directional).  Tandem stance on Airex with cone taps in //-bars for L ankle stability.     PATIENT EDUCATION:  Education details: Pain management, scar management, activity  limitations Person educated: Patient Education method: Explanation Education comprehension: verbalized understanding  HOME EXERCISE PROGRAM:  Access Code: [email or web address] URL: [email or web address] Date: 02/02/2023 Prepared by: Hazeline Lister  Exercises - Long Sitting Ankle Dorsiflexion with Anchored Resistance  - 1 x daily - 7 x weekly - 3 sets - 10 reps - Seated Ankle Eversion with Resistance  - 1 x daily - 7 x weekly - 3 sets - 10 reps - Seated Ankle Plantar Flexion with Resistance Loop  - 1 x daily - 7 x weekly - 3 sets - 10 reps - Seated Ankle Inversion with Anchored Resistance  - 1 x daily - 7 x weekly - 3 sets - 10 reps - Sidelying Hip Abduction with Resistance at Ankle  - 1 x daily - 7 x weekly - 3 sets - 10 reps - Side Stepping with Resistance at Thighs and Ankles  - 1 x daily - 7 x weekly - 3 sets - 10 reps  ASSESSMENT:  CLINICAL IMPRESSION:  Today's treatment session focused on progressing current therapeutic exercises to further challenge LE proprioception.  Marked increase in L ankle AROM (all planes) in a pain tolerable range.  Pt. Has continued tenderness along incision/ good healing noted.  Pt. Is demonstrating progress with blaze-pod activities and were able to add resistance at ankles with RTB.  Pt. Has also been able to demonstrate anterior pelvic tilt correction with verbal cues rather than demonstration by PT. Pt. 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 Pt. Will benefit from skilled PT services to increase L ankle ROM/ strength to improve pain-free mobility/ return to work with no limitations.    OBJECTIVE IMPAIRMENTS: decreased mobility.   ACTIVITY LIMITATIONS: squatting  PARTICIPATION LIMITATIONS: occupation  PERSONAL FACTORS: Past/current experiences are also affecting patient's functional outcome.   REHAB POTENTIAL: Good  CLINICAL DECISION MAKING:  Stable/uncomplicated  EVALUATION COMPLEXITY: Low   GOALS: Goals reviewed with patient? Yes  SHORT TERM GOALS: Target date: 02/23/2023  Patient will be able to demonstrate 200 ft. of gait corrected walking with NPS of 2/10 or less to improve community mobility.    Baseline: Goal status:  Partially met  2.  Patient independent with HEP to increase L ankle dorsiflexion AROM to >7 degrees to increase ground clearance during gait cycle. Baseline: 3 degrees.  2/10: see above Goal status: Goal met   LONG TERM GOALS: Target date: 03/16/2023  Patient will be able to perform 10 bilateral forward lunges with NPS 0/10 to return to confidently performing functional ADLs/IADLs related to lifting/carrying objects. Baseline: NT Goal status: INITIAL  2.  Patient will demonstrate 15 degrees of L ankle dorsiflexion to increase ground clearance during gait and promote normalized gait pattern.  Baseline: 3 degrees Goal status: INITIAL  3.  Patient will increase proximal hip strength (ABD, ADD, IR/ER, EXT.) to 4/5 to increase global LE strength/stability. Baseline: 3/5 Goal status: INITIAL  4.  Patient will be able to jog in place for 2 minutes with no left ankle pain 0/10 NPS to promote return to gym based ex. Baseline:  Goal status: INITIAL  PLAN:  PT FREQUENCY: 2x/week  PT DURATION: 6 weeks  PLANNED INTERVENTIONS: 97110-Therapeutic exercises, 97530- Therapeutic activity, [id number]- Neuromuscular re-education, 97535- Self Care, 96045- Manual therapy, and 97116- Gait training  PLAN FOR NEXT SESSION:  Discuss MD f/u.  Progress HEP  Lendell Quarry, PT, DPT # [phone] Physical Therapist - [facility]  02/16/2023, 10:29 AM   Carollee Circle, SPT

## 2023-02-18 ENCOUNTER — Encounter: Payer: BC Managed Care – PPO | Admitting: Physical Therapy

## 2023-02-18 ENCOUNTER — Encounter: Payer: Self-pay | Admitting: Physical Therapy

## 2023-02-18 ENCOUNTER — Other Ambulatory Visit: Payer: Self-pay | Admitting: *Deleted

## 2023-02-18 ENCOUNTER — Ambulatory Visit: Payer: BC Managed Care – PPO | Admitting: Physical Therapy

## 2023-02-18 DIAGNOSIS — M6281 Muscle weakness (generalized): Secondary | ICD-10-CM

## 2023-02-18 DIAGNOSIS — M25672 Stiffness of left ankle, not elsewhere classified: Secondary | ICD-10-CM

## 2023-02-18 DIAGNOSIS — M25572 Pain in left ankle and joints of left foot: Secondary | ICD-10-CM

## 2023-02-18 DIAGNOSIS — R262 Difficulty in walking, not elsewhere classified: Secondary | ICD-10-CM

## 2023-02-18 DIAGNOSIS — R600 Localized edema: Secondary | ICD-10-CM

## 2023-02-18 MED ORDER — UBRELVY 100 MG PO TABS: 1.0000 | ORAL_TABLET | ORAL | 11 refills | Status: AC | PRN

## 2023-02-18 NOTE — Therapy (Signed)
OUTPATIENT PHYSICAL THERAPY LOWER EXTREMITY TREATMENT  Patient Name: Kimberly [name] MRN: [id number] DOB:[birth year], 53 y.o., female Today's Date: 02/18/2023  END OF SESSION:  PT End of Session - 02/18/23 0905     Visit Number 6    Number of Visits 12    Date for PT Re-Evaluation 03/16/23    PT Start Time 0902    PT Stop Time 0950    PT Time Calculation (min) 48 min            Past Medical History:  Diagnosis Date   Anorectal fistula    Anxiety    Arthritis    neck, hips   Chronic headaches    migraines   History of hypertension    since wt loss after gastric sleeve 2014--  no longer issue   Hx of adenomatous colonic polyps 03/28/2022   Hypertension    Irritable bowel syndrome    PONV (postoperative nausea and vomiting)    slow to wake up   Wears glasses    Past Surgical History:  Procedure Laterality Date   BREAST REDUCTION SURGERY  1996   CESAREAN SECTION  2001   COLONOSCOPY  10-18-2010   w/ bx's   ESOPHAGOGASTRODUODENOSCOPY N/A 06/29/2012   Procedure: ESOPHAGOGASTRODUODENOSCOPY (EGD);  Surgeon: Lodema Pilot, DO;  Location: [facility];  Service: General;  Laterality: N/A;   EVALUATION UNDER ANESTHESIA WITH ANAL FISTULECTOMY N/A 04/14/2014   Procedure: ANAL EXAM UNDER ANESTHESIA WITH  FISTULOTOMY;  Surgeon: Romie Levee, MD;  Location: [facility];  Service: General;  Laterality: N/A;   LAPAROSCOPIC BILATERAL SALPINGECTOMY N/A 06/29/2012   Procedure: LAPAROSCOPIC BILATERAL SALPINGECTOMY;  Surgeon: Reva Bores, MD;  Location: [facility];  Service: Gynecology;  Laterality: N/A;   LAPAROSCOPIC GASTRIC SLEEVE RESECTION N/A 06/29/2012   Procedure: LAPAROSCOPIC GASTRIC SLEEVE RESECTION;  Surgeon: Lodema Pilot, DO;  Location: [facility];  Service: General;  Laterality: N/A;  Laparoscopic Sleeve Gastrectomy with EGD   REDUCTION MAMMAPLASTY Bilateral    VAGINAL HYSTERECTOMY N/A 01/13/2017   Procedure: HYSTERECTOMY VAGINAL;  Surgeon: Reva Bores, MD;  Location: [facility];   Service: Gynecology;  Laterality: N/A;   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   Migraine without aura 09/13/2010   ANA positive 09/13/2010   Polyarthralgia 09/13/2010   Essential hypertension 01/11/2009   DE QUERVAIN'S TENOSYNOVITIS 01/26/2008   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   REFERRING PROVIDER: Jannette Fogo, MD  REFERRING DIAG:   Posterior tibial tendinitis of left lower extremity  Tear of peroneal tendon, left, initial encounter  Impingement syndrome of left ankle  THERAPY DIAG:   Pain in left ankle and joints of left foot  Muscle weakness (generalized)  Ankle joint stiffness, left  Difficulty in walking, not elsewhere classified  Localized edema  Rationale for Evaluation and Treatment: Rehabilitation  ONSET DATE: Surgery Date 12/10/2022  SUBJECTIVE:   SUBJECTIVE STATEMENT:   Pt. Arrives to PT clinic FWB and prepared for evaluation session. Patient reports they are 7 weeks s/p L lateral ankle surgery following an injury during a Zumba class. Pt. Expressed having a history of bilateral ankle sprains with cheerleading and gymnastics participation in their past. Pt. Notes having PT in past with benefit but not liking dry-needling after past PT treatments. 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 Pt. Highlights that they would like to be able to get back to their normal routines and be able to trust their ankle to support them. Pt. Describes that they are excited and ready to begin physical therapy.  PERTINENT HISTORY:  Signs and Symptoms concordant with EDS. PAIN:   Are you having pain? Yes: NPRS scale: 3/10 Pain location: Left Ankle Pain description: Dull/achy Aggravating factors: Long durations of  weightbearing, AROM dorsiflexion/inversion Relieving factors: Ice and rest  PRECAUTIONS: None  RED FLAGS:  Signs and Sx's concordant with EDS variant    WEIGHT BEARING RESTRICTIONS:  No  FALLS:   Has patient fallen in last 6 months? 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  Patient demonstrates rounded shoulders and forward head posture.  Patient demonstrates anterior pelvic tilt/ MCI of pelvic stabilization musculature.  Pt. Surgical sites are clean, sterile, and healing well.  Pt. Demonstrates decreased strength in proximal hip musculature  Pt. Demonstrates P with all AROM for L ankle. NPS 4/10  Note: Objective measures were completed at Evaluation unless otherwise noted.  DIAGNOSTIC FINDINGS:   L Lateral ankle ROM/Strength Deficit  PATIENT SURVEYS:    LEFS: 27/80  COGNITION:  Overall cognitive status: Within functional limits for tasks assessed    SENSATION:  WFL  EDEMA:  Figure 8: R 53 cm/ L 60 cm  MUSCLE LENGTH:  NT  POSTURE: rounded shoulders  PALPATION:  Residual edema present in L lateral ankle.  Discussed scar massage/ incision healing.    LOWER EXTREMITY ROM:  Active ROM Right eval Left eval  Hip flexion    Hip extension    Hip abduction    Hip adduction    Hip internal rotation    Hip external rotation    Knee flexion    Knee extension    Ankle dorsiflexion 20 deg. 3 deg.  Ankle plantarflexion 50 deg. 40 deg.  Ankle inversion 30 deg. 18 deg.   Ankle eversion 20 deg. 8 deg.   (Blank rows = not tested)  LOWER  EXTREMITY MMT:  MMT Right eval Left eval  Hip flexion 3 3  Hip extension 4 4  Hip abduction 3 3  Hip adduction    Hip internal rotation 4 4  Hip external rotation 4 4  Knee flexion 4 4  Knee extension 5 5  Ankle dorsiflexion 4 3  Ankle plantarflexion 5 3  Ankle inversion 5 3  Ankle eversion 5 3   (Blank rows = not tested)  LOWER EXTREMITY SPECIAL TESTS:   NT  FUNCTIONAL TESTS:   AROM: Ankle Dorsiflexion  GAIT: Distance walked: 50 ft. In clinic Assistive device utilized: None Level of assistance: Complete Independence Comments: Slight antalgic gait favoring weightbearing through LLE.  TREATMENT DATE: 02/18/2023  Subjective:  Pt. Arrives to PT with 4/10 L medial ankle discomfort. Pt. Expresses that they have been compliant with their current HEP and they are able to complete exercises at gym. Pt. Describes that they were more sore this morning from their workout at the gym yesterday. Pt. Notes that their recent MD appointment went very well on 02/17/2023. Pt. Explains that they are feeling better about starting work next week.    Therapeutic Exercise:  Nustep 10 Minutes. L4 RPE 4/10 and NPS 2/10 L ankle.  B UE/LE for 7 min. And B LE only for last 3 min.    Walking in hallway (varying cadences)- added high marching 2 laps.  Improved heel strike/ toe off on L.  Slight increase in L antalgic gait with increase cadence.    Staircase FFE Lunge 12" step. 4 sets with 1 minute continuous repetitions. Bilaterally.  Eccentric step down on 6" step.    Manual tx.:  Reassessment of L ankle AROM in supine:  DF: 10 deg./ PF: 58 deg. (Marked increase)/ IV: 36 deg./ EV: 16 deg.    Supine L ankle stretches (all planes)  There.act.:  Blaze Pods: Heel Walking with heel taps. 2 x1 minute. Keeping toes off floor. RPE 7/10  //-Bar AirEx Balance beam tandem stance walking  (multi-directional).  Tandem stance on Airex with cone taps in //-bars for L ankle stability.    Star pattern foot slider AROM dynamic ankle stretch. Keeping foot flat on slider. 2 x 2 minutes bilaterally.  LEFS: 48   PATIENT EDUCATION:  Education details: Pain management, scar management, activity limitations Person educated: Patient Education method: Explanation Education comprehension: verbalized understanding  HOME EXERCISE PROGRAM:  Access Code: [email or web address] URL: [email or web address] Date: 02/02/2023 Prepared by: Dorene Grebe  Exercises - Long Sitting Ankle Dorsiflexion with Anchored Resistance  - 1 x daily - 7 x weekly - 3 sets - 10 reps - Seated Ankle Eversion with Resistance  - 1 x daily - 7 x weekly - 3 sets - 10 reps - Seated Ankle Plantar Flexion with Resistance Loop  - 1 x daily - 7 x weekly - 3 sets - 10 reps - Seated Ankle Inversion with Anchored Resistance  - 1 x daily - 7 x weekly - 3 sets - 10 reps - Sidelying Hip Abduction with Resistance at Ankle  - 1 x daily - 7 x weekly - 3 sets - 10 reps - Side Stepping with Resistance at Thighs and Ankles  - 1 x daily - 7 x weekly - 3 sets - 10 reps  ASSESSMENT:  CLINICAL IMPRESSION:  Today's treatment session focused on activity modification and pain modulation as a result of pt. Arriving to treatment session with NPS of 5/10 (sore from last night's workout). Pt.  Marked increase in L ankle AROM (all planes) in a pain tolerable range.  Pt. Has continued tenderness along medial ankle incision. PT Educated pt. On activity modification and pain management strategies. Pt. Is progressing their participation with various ADLs/IADLs s/p ankle surgery such as performing gym exercises while standing. Pt. Improved LEFS from 27 to 48. Pt. Will benefit from skilled PT services to increase L ankle ROM/ strength to improve pain-free mobility/ return to work with no limitations.    OBJECTIVE IMPAIRMENTS: decreased mobility.    ACTIVITY LIMITATIONS: squatting  PARTICIPATION LIMITATIONS: occupation  PERSONAL FACTORS: Past/current experiences are also affecting patient's functional outcome.   REHAB POTENTIAL: Good  CLINICAL DECISION MAKING: Stable/uncomplicated  EVALUATION COMPLEXITY: Low  GOALS: Goals reviewed with patient? Yes  SHORT TERM GOALS: Target date: 02/23/2023  Patient will be able to demonstrate 200 ft. of gait corrected walking with NPS of 2/10 or less to improve community mobility.    Baseline: Goal status: Partially met  2.  Patient independent with HEP to increase L ankle dorsiflexion AROM to >7 degrees to increase ground clearance during gait cycle. Baseline: 3 degrees.  2/10: see above Goal status: Goal met   LONG TERM GOALS: Target date: 03/16/2023  Patient will be able to perform 10 bilateral forward lunges with NPS 0/10 to return to confidently performing functional ADLs/IADLs related to lifting/carrying objects. Baseline: NT Goal status: INITIAL  2.  Patient will demonstrate 15 degrees of L ankle dorsiflexion to increase ground clearance during gait and promote normalized gait pattern.  Baseline: 3 degrees Goal status: INITIAL  3.  Patient will increase proximal hip strength (ABD, ADD, IR/ER, EXT.) to 4/5 to increase global LE strength/stability. Baseline: 3/5 Goal status: INITIAL  4.  Patient will be able to jog in place for 2 minutes with no left ankle pain 0/10 NPS to promote return to gym based ex. Baseline:  Goal status: INITIAL  PLAN:  PT FREQUENCY: 2x/week  PT DURATION: 6 weeks  PLANNED INTERVENTIONS: 97110-Therapeutic exercises, 97530- Therapeutic activity, [id number]- Neuromuscular re-education, 97535- Self Care, [id number]- Manual therapy, and 97116- Gait training  PLAN FOR NEXT SESSION:  Progress B LE proprioception there. Exercises.  Cammie Mcgee, PT, DPT # [phone] Physical Therapist - [facility]  02/18/2023, 9:45 AM    Milford Cage, SPT

## 2023-02-18 NOTE — Progress Notes (Signed)
Fax received from [facility] needing RX resent due to a glitch in their system

## 2023-02-21 ENCOUNTER — Encounter: Payer: Self-pay | Admitting: Sports Medicine

## 2023-02-21 ENCOUNTER — Encounter: Payer: Self-pay | Admitting: Family Medicine

## 2023-02-23 ENCOUNTER — Encounter: Payer: BC Managed Care – PPO | Admitting: Physical Therapy

## 2023-02-23 MED ORDER — AZITHROMYCIN 250 MG PO TABS: 250.0000 mg | ORAL_TABLET | Freq: Every day | ORAL | 0 refills | Status: DC

## 2023-02-25 ENCOUNTER — Ambulatory Visit: Payer: BC Managed Care – PPO | Admitting: Physical Therapy

## 2023-02-25 ENCOUNTER — Encounter: Payer: BC Managed Care – PPO | Admitting: Physical Therapy

## 2023-02-25 ENCOUNTER — Encounter: Payer: Self-pay | Admitting: Physical Therapy

## 2023-02-25 DIAGNOSIS — R262 Difficulty in walking, not elsewhere classified: Secondary | ICD-10-CM

## 2023-02-25 DIAGNOSIS — M25572 Pain in left ankle and joints of left foot: Secondary | ICD-10-CM | POA: Diagnosis not present

## 2023-02-25 DIAGNOSIS — M25672 Stiffness of left ankle, not elsewhere classified: Secondary | ICD-10-CM

## 2023-02-25 DIAGNOSIS — R600 Localized edema: Secondary | ICD-10-CM

## 2023-02-25 DIAGNOSIS — M6281 Muscle weakness (generalized): Secondary | ICD-10-CM

## 2023-02-25 NOTE — Therapy (Signed)
 OUTPATIENT PHYSICAL THERAPY LOWER EXTREMITY TREATMENT  Patient Name: Kimberly [name] MRN: [id number] DOB:29-Jul-1970, 53 y.o., female Today's Date: 02/25/2023  END OF SESSION:  PT End of Session - 02/25/23 0823     Visit Number 7    Number of Visits 12    Date for PT Re-Evaluation 03/16/23    PT Start Time 0815    PT Stop Time 0908    PT Time Calculation (min) 53 min    Behavior During Therapy [facility] for tasks assessed/performed            Past Medical History:  Diagnosis Date   Anorectal fistula    Anxiety    Arthritis    neck, hips   Chronic headaches    migraines   History of hypertension    since wt loss after gastric sleeve 2014--  no longer issue   Hx of adenomatous colonic polyps 03/28/2022   Hypertension    Irritable bowel syndrome    PONV (postoperative nausea and vomiting)    slow to wake up   Wears glasses    Past Surgical History:  Procedure Laterality Date   BREAST REDUCTION SURGERY  1996   CESAREAN SECTION  2001   COLONOSCOPY  10-18-2010   w/ bx's   ESOPHAGOGASTRODUODENOSCOPY N/A 06/29/2012   Procedure: ESOPHAGOGASTRODUODENOSCOPY (EGD);  Surgeon: Lodema Pilot, DO;  Location: [facility];  Service: General;  Laterality: N/A;   EVALUATION UNDER ANESTHESIA WITH ANAL FISTULECTOMY N/A 04/14/2014   Procedure: ANAL EXAM UNDER ANESTHESIA WITH  FISTULOTOMY;  Surgeon: Romie Levee, MD;  Location: [facility];  Service: General;  Laterality: N/A;   LAPAROSCOPIC BILATERAL SALPINGECTOMY N/A 06/29/2012   Procedure: LAPAROSCOPIC BILATERAL SALPINGECTOMY;  Surgeon: Reva Bores, MD;  Location: [facility];  Service: Gynecology;  Laterality: N/A;   LAPAROSCOPIC GASTRIC SLEEVE RESECTION N/A 06/29/2012   Procedure: LAPAROSCOPIC GASTRIC SLEEVE RESECTION;  Surgeon: Lodema Pilot, DO;  Location: [facility];  Service: General;  Laterality: N/A;  Laparoscopic Sleeve Gastrectomy with EGD   REDUCTION MAMMAPLASTY Bilateral    VAGINAL HYSTERECTOMY N/A 01/13/2017   Procedure: HYSTERECTOMY  VAGINAL;  Surgeon: Reva Bores, MD;  Location: [facility];  Service: Gynecology;  Laterality: N/A;   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   Migraine without aura 09/13/2010   ANA positive 09/13/2010   Polyarthralgia 09/13/2010   Essential hypertension 01/11/2009   DE QUERVAIN'S TENOSYNOVITIS 01/26/2008   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   REFERRING PROVIDER: Jannette Fogo, MD  REFERRING DIAG:   Posterior tibial tendinitis of left lower extremity  Tear of peroneal tendon, left, initial encounter  Impingement syndrome of left ankle  THERAPY DIAG:   Pain in left ankle and joints of left foot  Muscle weakness (generalized)  Ankle joint stiffness, left  Difficulty in walking, not elsewhere classified  Localized edema  Rationale for Evaluation and Treatment: Rehabilitation  ONSET DATE: Surgery Date 12/10/2022  SUBJECTIVE:   SUBJECTIVE STATEMENT:   Pt. Arrives to PT clinic FWB and prepared for evaluation session. Patient reports they are 7 weeks s/p L lateral ankle surgery following an injury during a Zumba class. Pt. Expressed having a history of bilateral ankle sprains with cheerleading and gymnastics participation in their past. Pt. Notes having PT in past with benefit but not liking dry-needling after past PT treatments. 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 Pt. Highlights that they would like to be able to get back to their normal routines and be able to trust their ankle to support them. Pt. Describes that they are excited and ready to begin physical therapy.  PERTINENT HISTORY:  Signs and Symptoms concordant with EDS. PAIN:   Are you having pain? Yes: NPRS scale: 3/10 Pain location: Left Ankle Pain  description: Dull/achy Aggravating factors: Long durations of weightbearing, AROM dorsiflexion/inversion Relieving factors: Ice and rest  PRECAUTIONS: None  RED FLAGS:  Signs and Sx's concordant with EDS variant    WEIGHT BEARING RESTRICTIONS:  No  FALLS:   Has patient fallen in last 6 months? 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  Patient demonstrates rounded shoulders and forward head posture.  Patient demonstrates anterior pelvic tilt/ MCI of pelvic stabilization musculature.  Pt. Surgical sites are clean, sterile, and healing well.  Pt. Demonstrates decreased strength in proximal hip musculature  Pt. Demonstrates P with all AROM for L ankle. NPS 4/10  Note: Objective measures were completed at Evaluation unless otherwise noted.  DIAGNOSTIC FINDINGS:   L Lateral ankle ROM/Strength Deficit  PATIENT SURVEYS:    LEFS: 27/80  COGNITION:  Overall cognitive status: Within functional limits for tasks assessed    SENSATION:  WFL  EDEMA:  Figure 8: R 53 cm/ L 60 cm  MUSCLE LENGTH:  NT  POSTURE: rounded shoulders  PALPATION:  Residual edema present in L lateral ankle.  Discussed scar massage/ incision healing.    LOWER EXTREMITY ROM:  Active ROM Right eval Left eval  Hip flexion    Hip extension    Hip abduction    Hip adduction    Hip internal rotation    Hip external rotation    Knee flexion    Knee extension    Ankle dorsiflexion 20 deg. 3 deg.  Ankle plantarflexion 50 deg. 40 deg.  Ankle inversion 30 deg. 18 deg.   Ankle  eversion 20 deg. 8 deg.   (Blank rows = not tested)  LOWER EXTREMITY MMT:  MMT Right eval Left eval  Hip flexion 3 3  Hip extension 4 4  Hip abduction 3 3  Hip adduction    Hip internal rotation 4 4  Hip external rotation 4 4  Knee flexion 4 4  Knee extension 5 5  Ankle dorsiflexion 4 3  Ankle plantarflexion 5 3  Ankle inversion 5 3  Ankle eversion 5 3   (Blank rows = not tested)  LOWER EXTREMITY SPECIAL TESTS:   NT  FUNCTIONAL TESTS:   AROM: Ankle Dorsiflexion  GAIT: Distance walked: 50 ft. In clinic Assistive device utilized: None Level of assistance: Complete Independence Comments: Slight antalgic gait favoring weightbearing through LLE.  LEFS: 48  TREATMENT DATE: 02/25/2023  Subjective:  Pt. Arrives to PT with 4/10 L lateral and medial ankle discomfort. Pt. Expresses that they have been compliant with their current HEP and they are able to complete exercises at gym. Pt. States that returning to work was difficult because of increased walking demands but that they have adpated to using their scooter for part of the day to limit left ankle weight bearing. Pt. Notes that their recent MD appointment went very well on 02/17/2023. Pt. Explains that they are feeling better about starting work next week.    Therapeutic Exercise:  Nustep 10 Minutes. L4 RPE 4/10 and NPS 2/10 L ankle.  B UE/LE for 7 min. And B LE only for last 3 min.    Walking in hallway (varying cadences)- added high marching 2 laps.  Improved heel strike/ toe off on L.  Slight increase in L antalgic gait with increase cadence.    Staircase FFE Lunge 12" step. 4 sets with 1 minute continuous repetitions. Bilaterally.  Eccentric step down on 6" step.    Manual tx.:  Grade II AP L talocrural mobs 2x20  Grade III talocrural distraction 3x30   PROM with overpressure into R1, ten 3 second holds in  all directions  Supine L ankle stretches (all planes)  There.act.:  Blaze Pods: Heel Walking with heel taps. 2 x1 minute. Keeping toes off floor. RPE 7/10  //-Bar AirEx Balance beam tandem stance walking (multi-directional).  Tandem stance on Airex with cone taps in //-bars for L ankle stability.    Star pattern foot slider AROM dynamic ankle stretch. Keeping foot flat on slider. 2 x 2 minutes bilaterally.   PATIENT EDUCATION:  Education details: Pain management, scar management, activity limitations Person educated: Patient Education method: Explanation Education comprehension: verbalized understanding  HOME EXERCISE PROGRAM:  Access Code: [email or web address] URL: [email or web address] Date: 02/02/2023 Prepared by: Dorene Grebe  Exercises - Long Sitting Ankle Dorsiflexion with Anchored Resistance  - 1 x daily - 7 x weekly - 3 sets - 10 reps - Seated Ankle Eversion with Resistance  - 1 x daily - 7 x weekly - 3 sets - 10 reps - Seated Ankle Plantar Flexion with Resistance Loop  - 1 x daily - 7 x weekly - 3 sets - 10 reps - Seated Ankle Inversion with Anchored Resistance  - 1 x daily - 7 x weekly - 3 sets - 10 reps - Sidelying Hip Abduction with Resistance at Ankle  - 1 x daily - 7 x weekly - 3 sets - 10 reps - Side Stepping with Resistance at Thighs and Ankles  - 1 x daily - 7 x weekly - 3 sets - 10 reps  ASSESSMENT:  CLINICAL IMPRESSION:  Today's treatment session focused on activity modification and pain modulation as a result of pt. Arriving to treatment session with NPS of 4/10 and 7/10 after work days. Pt. Demonstrated PROM > AROM for all L ankle ROM.  Pt. Has continued tenderness along medial/lateral ankle incision. PT Educated pt. On activity modification and gastrocnemius stretching techniques at work. Pt. Is progressing their participation with various ADLs/IADLs s/p ankle surgery based on pain tolerance. Pt. Will benefit from skilled PT services to increase L  ankle ROM/ strength to improve pain-free mobility/ return to work with no limitations.    OBJECTIVE IMPAIRMENTS: decreased mobility.   ACTIVITY LIMITATIONS: squatting  PARTICIPATION LIMITATIONS: occupation  PERSONAL FACTORS: Past/current experiences are also affecting patient's functional outcome.   REHAB POTENTIAL: Good  CLINICAL DECISION  MAKING: Stable/uncomplicated  EVALUATION COMPLEXITY: Low   GOALS: Goals reviewed with patient? Yes  SHORT TERM GOALS: Target date: 02/23/2023  Patient will be able to demonstrate 200 ft. of gait corrected walking with NPS of 2/10 or less to improve community mobility.    Baseline: Goal status: Partially met  2.  Patient independent with HEP to increase L ankle dorsiflexion AROM to >7 degrees to increase ground clearance during gait cycle. Baseline: 3 degrees.  2/10: see above Goal status: Goal met   LONG TERM GOALS: Target date: 03/16/2023  Patient will be able to perform 10 bilateral forward lunges with NPS 0/10 to return to confidently performing functional ADLs/IADLs related to lifting/carrying objects. Baseline: NT Goal status: INITIAL  2.  Patient will demonstrate 15 degrees of L ankle dorsiflexion to increase ground clearance during gait and promote normalized gait pattern.  Baseline: 3 degrees Goal status: INITIAL  3.  Patient will increase proximal hip strength (ABD, ADD, IR/ER, EXT.) to 4/5 to increase global LE strength/stability. Baseline: 3/5 Goal status: INITIAL  4.  Patient will be able to jog in place for 2 minutes with no left ankle pain 0/10 NPS to promote return to gym based ex. Baseline:  Goal status: INITIAL  PLAN:  PT FREQUENCY: 2x/week  PT DURATION: 6 weeks  PLANNED INTERVENTIONS: 97110-Therapeutic exercises, 97530- Therapeutic activity, [id number]- Neuromuscular re-education, 97535- Self Care, 16109- Manual therapy, and 97116- Gait training  PLAN FOR NEXT SESSION:  Continue manual therapy interventions to  improve PROM and add dorsiflexion strength interventions to HEP. Assess goals.  Cammie Mcgee, PT, DPT # [phone] Physical Therapist - [facility]  02/25/2023, 9:19 AM   Milford Cage, SPT

## 2023-02-27 ENCOUNTER — Ambulatory Visit: Payer: BC Managed Care – PPO | Admitting: Physical Therapy

## 2023-02-27 ENCOUNTER — Encounter: Payer: Self-pay | Admitting: Physical Therapy

## 2023-02-27 DIAGNOSIS — M25572 Pain in left ankle and joints of left foot: Secondary | ICD-10-CM | POA: Diagnosis not present

## 2023-02-27 DIAGNOSIS — R600 Localized edema: Secondary | ICD-10-CM

## 2023-02-27 DIAGNOSIS — R262 Difficulty in walking, not elsewhere classified: Secondary | ICD-10-CM

## 2023-02-27 DIAGNOSIS — M25672 Stiffness of left ankle, not elsewhere classified: Secondary | ICD-10-CM

## 2023-02-27 DIAGNOSIS — M6281 Muscle weakness (generalized): Secondary | ICD-10-CM

## 2023-02-27 NOTE — Therapy (Signed)
 OUTPATIENT PHYSICAL THERAPY LOWER EXTREMITY TREATMENT  Patient Name: Kimberly [name] MRN: [id number] DOB:10/10/1970, 53 y.o., female Today's Date: 02/27/2023  END OF SESSION:  PT End of Session - 02/27/23 1423     Visit Number 8    Number of Visits 12    Date for PT Re-Evaluation 03/16/23    PT Start Time 1421    PT Stop Time 1515    PT Time Calculation (min) 54 min    Behavior During Therapy [facility] for tasks assessed/performed            Past Medical History:  Diagnosis Date   Anorectal fistula    Anxiety    Arthritis    neck, hips   Chronic headaches    migraines   History of hypertension    since wt loss after gastric sleeve 2014--  no longer issue   Hx of adenomatous colonic polyps 03/28/2022   Hypertension    Irritable bowel syndrome    PONV (postoperative nausea and vomiting)    slow to wake up   Wears glasses    Past Surgical History:  Procedure Laterality Date   BREAST REDUCTION SURGERY  1996   CESAREAN SECTION  2001   COLONOSCOPY  10-18-2010   w/ bx's   ESOPHAGOGASTRODUODENOSCOPY N/A 06/29/2012   Procedure: ESOPHAGOGASTRODUODENOSCOPY (EGD);  Surgeon: Lodema Pilot, DO;  Location: [facility];  Service: General;  Laterality: N/A;   EVALUATION UNDER ANESTHESIA WITH ANAL FISTULECTOMY N/A 04/14/2014   Procedure: ANAL EXAM UNDER ANESTHESIA WITH  FISTULOTOMY;  Surgeon: Romie Levee, MD;  Location: [facility] ;  Service: General;  Laterality: N/A;   LAPAROSCOPIC BILATERAL SALPINGECTOMY N/A 06/29/2012   Procedure: LAPAROSCOPIC BILATERAL SALPINGECTOMY;  Surgeon: Reva Bores, MD;  Location: [facility];  Service: Gynecology;  Laterality: N/A;   LAPAROSCOPIC GASTRIC SLEEVE RESECTION N/A 06/29/2012   Procedure: LAPAROSCOPIC GASTRIC SLEEVE RESECTION;  Surgeon: Lodema Pilot, DO;  Location: [facility];  Service: General;  Laterality: N/A;  Laparoscopic Sleeve Gastrectomy with EGD   REDUCTION MAMMAPLASTY Bilateral    VAGINAL HYSTERECTOMY N/A 01/13/2017   Procedure: HYSTERECTOMY  VAGINAL;  Surgeon: Reva Bores, MD;  Location: [facility];  Service: Gynecology;  Laterality: N/A;   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   Migraine without aura 09/13/2010   ANA positive 09/13/2010   Polyarthralgia 09/13/2010   Essential hypertension 01/11/2009   DE QUERVAIN'S TENOSYNOVITIS 01/26/2008   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   REFERRING PROVIDER: Jannette Fogo, MD  REFERRING DIAG:   Posterior tibial tendinitis of left lower extremity  Tear of peroneal tendon, left, initial encounter  Impingement syndrome of left ankle  THERAPY DIAG:   Pain in left ankle and joints of left foot  Muscle weakness (generalized)  Ankle joint stiffness, left  Difficulty in walking, not elsewhere classified  Localized edema  Rationale for Evaluation and Treatment: Rehabilitation  ONSET DATE: Surgery Date 12/10/2022  SUBJECTIVE:   SUBJECTIVE STATEMENT:   Pt. Arrives to PT clinic FWB and prepared for evaluation session. Patient reports they are 7 weeks s/p L lateral ankle surgery following an injury during a Zumba class. Pt. Expressed having a history of bilateral ankle sprains with cheerleading and gymnastics participation in their past. Pt. Notes having PT in past with benefit but not liking dry-needling after past PT treatments. 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 Pt. Highlights that they would like to be able to get back to their normal routines and be able to trust their ankle to support them. Pt. Describes that they are excited and ready to begin physical therapy.  PERTINENT HISTORY:  Signs and Symptoms concordant with EDS. PAIN:   Are you having pain? Yes: NPRS scale: 3/10 Pain location: Left Ankle Pain  description: Dull/achy Aggravating factors: Long durations of weightbearing, AROM dorsiflexion/inversion Relieving factors: Ice and rest  PRECAUTIONS: None  RED FLAGS:  Signs and Sx's concordant with EDS variant    WEIGHT BEARING RESTRICTIONS:  No  FALLS:   Has patient fallen in last 6 months? 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  Patient demonstrates rounded shoulders and forward head posture.  Patient demonstrates anterior pelvic tilt/ MCI of pelvic stabilization musculature.  Pt. Surgical sites are clean, sterile, and healing well.  Pt. Demonstrates decreased strength in proximal hip musculature  Pt. Demonstrates P with all AROM for L ankle. NPS 4/10  Note: Objective measures were completed at Evaluation unless otherwise noted.  DIAGNOSTIC FINDINGS:   L Lateral ankle ROM/Strength Deficit  PATIENT SURVEYS:    LEFS: 27/80  COGNITION:  Overall cognitive status: Within functional limits for tasks assessed    SENSATION:  WFL  EDEMA:  Figure 8: R 53 cm/ L 60 cm  MUSCLE LENGTH:  NT  POSTURE: rounded shoulders  PALPATION:  Residual edema present in L lateral ankle.  Discussed scar massage/ incision healing.    LOWER EXTREMITY ROM:  Active ROM Right eval Left eval  Hip flexion    Hip extension    Hip abduction    Hip adduction    Hip internal rotation    Hip external rotation    Knee flexion    Knee extension    Ankle dorsiflexion 20 deg. 3 deg.  Ankle plantarflexion 50 deg. 40 deg.  Ankle inversion 30 deg. 18 deg.   Ankle  eversion 20 deg. 8 deg.   (Blank rows = not tested)  LOWER EXTREMITY MMT:  MMT Right eval Left eval  Hip flexion 3 3  Hip extension 4 4  Hip abduction 3 3  Hip adduction    Hip internal rotation 4 4  Hip external rotation 4 4  Knee flexion 4 4  Knee extension 5 5  Ankle dorsiflexion 4 3  Ankle plantarflexion 5 3  Ankle inversion 5 3  Ankle eversion 5 3   (Blank rows = not tested)  LOWER EXTREMITY SPECIAL TESTS:   NT  FUNCTIONAL TESTS:   AROM: Ankle Dorsiflexion  GAIT: Distance walked: 50 ft. In clinic Assistive device utilized: None Level of assistance: Complete Independence Comments: Slight antalgic gait favoring weightbearing through LLE.  LEFS: 48  TREATMENT DATE: 02/27/2023  Subjective:  Pt. Arrives to PT with 3/10 NPS L lateral and medial ankle discomfort. Pt. Expresses that they are still going to the gym regularly. Pt. States that their return to work has been better since limiting total standing time and total walking throughout the day.  Therapeutic Exercise:  Nustep L5 for 10 min. B LE only.  Discussed daily activity/ HEP.    Walking in hallway (varying cadences)- added high marching 2 laps.  Improved heel strike/ toe off on L.    Standing gastroc stretches on 1st step with static holds 3x on L.    TG knee flexion (midline/ toe in/ toe out)- 20x.  Heel raises 20x with cuing for equal wt. Bearing.    Manual tx.:  Grade II AP L talocrural mobs 2x20  Grade III talocrural distraction 3x30   PROM with overpressure into R1, ten 3 second holds in all directions  Supine L ankle stretches (all planes)  Graston STM to L intrinsic foot musculature and gastrocnemius complex    PATIENT EDUCATION:  Education details: Pain management, scar management, activity limitations Person educated: Patient Education method: Explanation Education comprehension:  verbalized understanding  HOME EXERCISE PROGRAM:  Access Code: [email or web address] URL: [email or web address] Date: 02/02/2023 Prepared by: Dorene Grebe  Exercises - Long Sitting Ankle Dorsiflexion with Anchored Resistance  - 1 x daily - 7 x weekly - 3 sets - 10 reps - Seated Ankle Eversion with Resistance  - 1 x daily - 7 x weekly - 3 sets - 10 reps - Seated Ankle Plantar Flexion with Resistance Loop  - 1 x daily - 7 x weekly - 3 sets - 10 reps - Seated Ankle Inversion with Anchored Resistance  - 1 x daily - 7 x weekly - 3 sets - 10 reps - Sidelying Hip Abduction with Resistance at Ankle  - 1 x daily - 7 x weekly - 3 sets - 10 reps - Side Stepping with Resistance at Thighs and Ankles  - 1 x daily - 7 x weekly - 3 sets - 10 reps  ASSESSMENT:  CLINICAL IMPRESSION:  Today's treatment session focused on pain management and all ROM ankle strength. Pt. Demonstrated increased AROM for L ankle dorsiflexion using overpressure and contract/relax technique. Pt. Presented with MTP in L gastrocnemius that resolved with graston STM followed by PT overpressure in end range dorsiflexion. Pt. Worked hard during treatment session.  PT sent work note to employer to limit standing/ walking tasks to prevent L ankle pain/ swelling.  Pt. Will benefit from skilled PT services to increase L ankle ROM/ strength to improve pain-free mobility/ return to work with no limitations.   OBJECTIVE IMPAIRMENTS: decreased mobility.   ACTIVITY LIMITATIONS: squatting  PARTICIPATION LIMITATIONS: occupation  PERSONAL FACTORS: Past/current experiences are also affecting patient's functional outcome.   REHAB POTENTIAL: Good  CLINICAL DECISION MAKING: Stable/uncomplicated  EVALUATION COMPLEXITY: Low   GOALS: Goals reviewed with patient? Yes  SHORT TERM GOALS: Target date: 02/23/2023  Patient will be able to demonstrate 200 ft. of gait corrected walking with NPS of 2/10 or less to improve community mobility.     Baseline: Goal status: Partially met  2.  Patient independent with HEP to increase L ankle dorsiflexion AROM to >7 degrees to increase ground clearance during gait cycle. Baseline: 3 degrees.  2/10: see above Goal status: Goal met   LONG TERM GOALS: Target date: 03/16/2023  Patient will be able to perform 10 bilateral forward lunges with NPS 0/10 to return  to confidently performing functional ADLs/IADLs related to lifting/carrying objects. Baseline: NT Goal status: INITIAL  2.  Patient will demonstrate 15 degrees of L ankle dorsiflexion to increase ground clearance during gait and promote normalized gait pattern.  Baseline: 3 degrees Goal status: INITIAL  3.  Patient will increase proximal hip strength (ABD, ADD, IR/ER, EXT.) to 4/5 to increase global LE strength/stability. Baseline: 3/5 Goal status: INITIAL  4.  Patient will be able to jog in place for 2 minutes with no left ankle pain 0/10 NPS to promote return to gym based ex. Baseline:  Goal status: INITIAL  PLAN:  PT FREQUENCY: 2x/week  PT DURATION: 6 weeks  PLANNED INTERVENTIONS: 97110-Therapeutic exercises, 97530- Therapeutic activity, [id number]- Neuromuscular re-education, 97535- Self Care, 21308- Manual therapy, and 97116- Gait training  PLAN FOR NEXT SESSION: Assess single leg balance and B LE proprioception.  Cammie Mcgee, PT, DPT # [phone] Physical Therapist - [facility]  02/27/2023, 3:42 PM   Milford Cage, SPT

## 2023-02-27 NOTE — Therapy (Incomplete)
OUTPATIENT PHYSICAL THERAPY LOWER EXTREMITY TREATMENT  Patient Name: Kimberly [name] MRN: [id number] DOB:January 31, 1970, 53 y.o., female Today's Date: 02/27/2023  END OF SESSION:  PT End of Session - 02/27/23 1423     Visit Number 8    Number of Visits 12    Date for PT Re-Evaluation 03/16/23    PT Start Time 1421    Behavior During Therapy [facility] for tasks assessed/performed            Past Medical History:  Diagnosis Date   Anorectal fistula    Anxiety    Arthritis    neck, hips   Chronic headaches    migraines   History of hypertension    since wt loss after gastric sleeve 2014--  no longer issue   Hx of adenomatous colonic polyps 03/28/2022   Hypertension    Irritable bowel syndrome    PONV (postoperative nausea and vomiting)    slow to wake up   Wears glasses    Past Surgical History:  Procedure Laterality Date   BREAST REDUCTION SURGERY  1996   CESAREAN SECTION  2001   COLONOSCOPY  10-18-2010   w/ bx's   ESOPHAGOGASTRODUODENOSCOPY N/A 06/29/2012   Procedure: ESOPHAGOGASTRODUODENOSCOPY (EGD);  Surgeon: Lodema Pilot, DO;  Location: [facility];  Service: General;  Laterality: N/A;   EVALUATION UNDER ANESTHESIA WITH ANAL FISTULECTOMY N/A 04/14/2014   Procedure: ANAL EXAM UNDER ANESTHESIA WITH  FISTULOTOMY;  Surgeon: Romie Levee, MD;  Location: [facility];  Service: General;  Laterality: N/A;   LAPAROSCOPIC BILATERAL SALPINGECTOMY N/A 06/29/2012   Procedure: LAPAROSCOPIC BILATERAL SALPINGECTOMY;  Surgeon: Reva Bores, MD;  Location: [facility];  Service: Gynecology;  Laterality: N/A;   LAPAROSCOPIC GASTRIC SLEEVE RESECTION N/A 06/29/2012   Procedure: LAPAROSCOPIC GASTRIC SLEEVE RESECTION;  Surgeon: Lodema Pilot, DO;  Location: [facility];  Service: General;  Laterality: N/A;  Laparoscopic Sleeve Gastrectomy with EGD   REDUCTION MAMMAPLASTY Bilateral    VAGINAL HYSTERECTOMY N/A 01/13/2017   Procedure: HYSTERECTOMY VAGINAL;  Surgeon: Reva Bores, MD;  Location: [facility];   Service: Gynecology;  Laterality: N/A;   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   Migraine without aura 09/13/2010   ANA positive 09/13/2010   Polyarthralgia 09/13/2010   Essential hypertension 01/11/2009   DE QUERVAIN'S TENOSYNOVITIS 01/26/2008   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   REFERRING PROVIDER: Jannette Fogo, MD  REFERRING DIAG:   Posterior tibial tendinitis of left lower extremity  Tear of peroneal tendon, left, initial encounter  Impingement syndrome of left ankle  THERAPY DIAG:   Pain in left ankle and joints of left foot  Muscle weakness (generalized)  Ankle joint stiffness, left  Difficulty in walking, not elsewhere classified  Localized edema  Rationale for Evaluation and Treatment: Rehabilitation  ONSET DATE: Surgery Date 12/10/2022  SUBJECTIVE:   SUBJECTIVE STATEMENT:   Pt. Arrives to PT clinic FWB and prepared for evaluation session. Patient reports they are 7 weeks s/p L lateral ankle surgery following an injury during a Zumba class. Pt. Expressed having a history of bilateral ankle sprains with cheerleading and gymnastics participation in their past. Pt. Notes having PT in past with benefit but not liking dry-needling after past PT treatments. Pt. 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 Pt. Highlights that they would like to be able to get back to their normal routines and be able to trust their ankle to support them. Pt. Describes that they are excited and ready to begin physical therapy.  PERTINENT HISTORY:  Signs and Symptoms concordant with EDS. PAIN:   Are you having pain? Yes: NPRS scale: 3/10 Pain location: Left Ankle Pain description: Dull/achy Aggravating factors: Long durations of  weightbearing, AROM dorsiflexion/inversion Relieving factors: Ice and rest  PRECAUTIONS: None  RED FLAGS:  Signs and Sx's concordant with EDS variant    WEIGHT BEARING RESTRICTIONS:  No  FALLS:   Has patient fallen in last 6 months? 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  Patient demonstrates rounded shoulders and forward head posture.  Patient demonstrates anterior pelvic tilt/ MCI of pelvic stabilization musculature.  Pt. Surgical sites are clean, sterile, and healing well.  Pt. Demonstrates decreased strength in proximal hip musculature  Pt. Demonstrates P with all AROM for L ankle. NPS 4/10  Note: Objective measures were completed at Evaluation unless otherwise noted.  DIAGNOSTIC FINDINGS:   L Lateral ankle ROM/Strength Deficit  PATIENT SURVEYS:    LEFS: 27/80  COGNITION:  Overall cognitive status: Within functional limits for tasks assessed    SENSATION:  WFL  EDEMA:  Figure 8: R 53 cm/ L 60 cm  MUSCLE LENGTH:  NT  POSTURE: rounded shoulders  PALPATION:  Residual edema present in L lateral ankle.  Discussed scar massage/ incision healing.    LOWER EXTREMITY ROM:  Active ROM Right eval Left eval  Hip flexion    Hip extension    Hip abduction    Hip adduction    Hip internal rotation    Hip external rotation    Knee flexion    Knee extension    Ankle dorsiflexion 20 deg. 3 deg.  Ankle plantarflexion 50 deg. 40 deg.  Ankle inversion 30 deg. 18 deg.   Ankle eversion 20 deg. 8 deg.   (Blank rows = not tested)  LOWER  EXTREMITY MMT:  MMT Right eval Left eval  Hip flexion 3 3  Hip extension 4 4  Hip abduction 3 3  Hip adduction    Hip internal rotation 4 4  Hip external rotation 4 4  Knee flexion 4 4  Knee extension 5 5  Ankle dorsiflexion 4 3  Ankle plantarflexion 5 3  Ankle inversion 5 3  Ankle eversion 5 3   (Blank rows = not tested)  LOWER EXTREMITY SPECIAL TESTS:   NT  FUNCTIONAL TESTS:   AROM: Ankle Dorsiflexion  GAIT: Distance walked: 50 ft. In clinic Assistive device utilized: None Level of assistance: Complete Independence Comments: Slight antalgic gait favoring weightbearing through LLE.  LEFS: 48  TREATMENT DATE: 02/27/2023  Subjective:  Pt. arrives to PT with 4/10 L lateral and medial ankle discomfort.  Pt. States she returned to work today due to snow past 2 days.  PT issued note to pt. To modify work tolerance.    Therapeutic Exercise:  Nustep L5 for 10 minutes B LE only.  Discussed return to work/ L ankle symptoms.    Standing 1st step gastroc stretches: 3x with holds (alternating with heel raises).  Walking in hallway (varying cadences)- added high marching 2 laps.  Improved heel strike/ toe off on L.  Slight increase in L antalgic gait with increase cadence.         Manual tx.:  Grade II AP L talocrural mobs 2x20  Grade III talocrural distraction 3x30   PROM with overpressure into R1, ten 3 second holds in all directions  Supine L ankle stretches (all planes)  There.act.:  Blaze Pods: Heel Walking with heel taps. 2 x1 minute. Keeping toes off floor. RPE 7/10  //-Bar AirEx Balance beam tandem stance walking (multi-directional).  Tandem stance on Airex with cone taps in //-bars for L ankle stability.    Star pattern foot slider AROM dynamic ankle stretch. Keeping foot flat on slider. 2 x 2 minutes bilaterally.   PATIENT EDUCATION:   Education details: Pain management, scar management, activity limitations Person educated: Patient Education method: Explanation Education comprehension: verbalized understanding  HOME EXERCISE PROGRAM:  Access Code: [email or web address] URL: [email or web address] Date: 02/02/2023 Prepared by: Dorene Grebe  Exercises - Long Sitting Ankle Dorsiflexion with Anchored Resistance  - 1 x daily - 7 x weekly - 3 sets - 10 reps - Seated Ankle Eversion with Resistance  - 1 x daily - 7 x weekly - 3 sets - 10 reps - Seated Ankle Plantar Flexion with Resistance Loop  - 1 x daily - 7 x weekly - 3 sets - 10 reps - Seated Ankle Inversion with Anchored Resistance  - 1 x daily - 7 x weekly - 3 sets - 10 reps - Sidelying Hip Abduction with Resistance at Ankle  - 1 x daily - 7 x weekly - 3 sets - 10 reps - Side Stepping with Resistance at Thighs and Ankles  - 1 x daily - 7 x weekly - 3 sets - 10 reps  ASSESSMENT:  CLINICAL IMPRESSION:  Today's treatment session focused on activity modification and pain modulation as a result of pt. Arriving to treatment session with NPS of 4/10 and 7/10 after work days. Pt. Demonstrated PROM > AROM for all L ankle ROM.  Pt. Has continued tenderness along medial/lateral ankle incision. PT Educated pt. On activity modification and gastrocnemius stretching techniques at work. Pt. Is progressing their participation with various ADLs/IADLs s/p ankle surgery based on pain tolerance. Pt. Will benefit from skilled PT services to increase L ankle ROM/ strength to improve pain-free mobility/ return to work with no limitations.    OBJECTIVE IMPAIRMENTS: decreased mobility.   ACTIVITY LIMITATIONS: squatting  PARTICIPATION LIMITATIONS: occupation  PERSONAL FACTORS: Past/current experiences are also affecting patient's functional outcome.   REHAB POTENTIAL: Good  CLINICAL DECISION MAKING: Stable/uncomplicated  EVALUATION COMPLEXITY: Low   GOALS: Goals reviewed with  patient? Yes  SHORT TERM GOALS: Target date: 02/23/2023  Patient will be able to demonstrate 200 ft. of gait corrected walking with NPS of 2/10 or less to improve community mobility.    Baseline: Goal status: Partially met  2.  Patient independent with HEP to increase L ankle dorsiflexion AROM to >7  degrees to increase ground clearance during gait cycle. Baseline: 3 degrees.  2/10: see above Goal status: Goal met   LONG TERM GOALS: Target date: 03/16/2023  Patient will be able to perform 10 bilateral forward lunges with NPS 0/10 to return to confidently performing functional ADLs/IADLs related to lifting/carrying objects. Baseline: NT Goal status: INITIAL  2.  Patient will demonstrate 15 degrees of L ankle dorsiflexion to increase ground clearance during gait and promote normalized gait pattern.  Baseline: 3 degrees Goal status: INITIAL  3.  Patient will increase proximal hip strength (ABD, ADD, IR/ER, EXT.) to 4/5 to increase global LE strength/stability. Baseline: 3/5 Goal status: INITIAL  4.  Patient will be able to jog in place for 2 minutes with no left ankle pain 0/10 NPS to promote return to gym based ex. Baseline:  Goal status: INITIAL  PLAN:  PT FREQUENCY: 2x/week  PT DURATION: 6 weeks  PLANNED INTERVENTIONS: 97110-Therapeutic exercises, 97530- Therapeutic activity, [id number]- Neuromuscular re-education, 97535- Self Care, 95621- Manual therapy, and 97116- Gait training  PLAN FOR NEXT SESSION:  Continue manual therapy interventions to improve PROM and add dorsiflexion strength interventions to HEP. Assess goals.  Cammie Mcgee, PT, DPT # [phone] Physical Therapist - [facility]  02/27/2023, 2:24 PM

## 2023-03-02 ENCOUNTER — Encounter: Payer: BC Managed Care – PPO | Admitting: Physical Therapy

## 2023-03-02 ENCOUNTER — Other Ambulatory Visit: Payer: Self-pay | Admitting: *Deleted

## 2023-03-02 MED ORDER — BUSPIRONE HCL 10 MG PO TABS: 10.0000 mg | ORAL_TABLET | Freq: Three times a day (TID) | ORAL | 0 refills | Status: DC

## 2023-03-03 ENCOUNTER — Ambulatory Visit: Payer: BC Managed Care – PPO | Admitting: Physical Therapy

## 2023-03-03 DIAGNOSIS — R262 Difficulty in walking, not elsewhere classified: Secondary | ICD-10-CM

## 2023-03-03 DIAGNOSIS — M25672 Stiffness of left ankle, not elsewhere classified: Secondary | ICD-10-CM

## 2023-03-03 DIAGNOSIS — M25572 Pain in left ankle and joints of left foot: Secondary | ICD-10-CM

## 2023-03-03 DIAGNOSIS — M6281 Muscle weakness (generalized): Secondary | ICD-10-CM

## 2023-03-03 DIAGNOSIS — R600 Localized edema: Secondary | ICD-10-CM

## 2023-03-03 NOTE — Therapy (Unsigned)
 OUTPATIENT PHYSICAL THERAPY LOWER EXTREMITY TREATMENT  Patient Name: Kimberly [name] MRN: [id number] DOB:[birth year], 53 y.o., female  Today's Date: 03/03/2023  END OF SESSION:  PT End of Session - 03/03/23 1301     Visit Number 9    Number of Visits 12    Date for PT Re-Evaluation 03/16/23    PT Start Time 1600    PT Stop Time 1647    PT Time Calculation (min) 47 min    Behavior During Therapy [facility] for tasks assessed/performed            Past Medical History:  Diagnosis Date   Anorectal fistula    Anxiety    Arthritis    neck, hips   Chronic headaches    migraines   History of hypertension    since wt loss after gastric sleeve 2014--  no longer issue   Hx of adenomatous colonic polyps 03/28/2022   Hypertension    Irritable bowel syndrome    PONV (postoperative nausea and vomiting)    slow to wake up   Wears glasses    Past Surgical History:  Procedure Laterality Date   BREAST REDUCTION SURGERY  1996   CESAREAN SECTION  2001   COLONOSCOPY  10-18-2010   w/ bx's   ESOPHAGOGASTRODUODENOSCOPY N/A 06/29/2012   Procedure: ESOPHAGOGASTRODUODENOSCOPY (EGD);  Surgeon: Lodema Pilot, DO;  Location: [facility];  Service: General;  Laterality: N/A;   EVALUATION UNDER ANESTHESIA WITH ANAL FISTULECTOMY N/A 04/14/2014   Procedure: ANAL EXAM UNDER ANESTHESIA WITH  FISTULOTOMY;  Surgeon: Romie Levee, MD;  Location: [facility] [place];  Service: General;  Laterality: N/A;   LAPAROSCOPIC BILATERAL SALPINGECTOMY N/A 06/29/2012   Procedure: LAPAROSCOPIC BILATERAL SALPINGECTOMY;  Surgeon: Reva Bores, MD;  Location: [facility];  Service: Gynecology;  Laterality: N/A;   LAPAROSCOPIC GASTRIC SLEEVE RESECTION N/A 06/29/2012   Procedure: LAPAROSCOPIC GASTRIC SLEEVE RESECTION;  Surgeon: Lodema Pilot, DO;  Location: [facility];  Service: General;  Laterality: N/A;  Laparoscopic Sleeve Gastrectomy with EGD   REDUCTION MAMMAPLASTY Bilateral    VAGINAL HYSTERECTOMY N/A 01/13/2017   Procedure:  HYSTERECTOMY VAGINAL;  Surgeon: Reva Bores, MD;  Location: [facility];  Service: Gynecology;  Laterality: N/A;   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   Migraine without aura 09/13/2010   ANA positive 09/13/2010   Polyarthralgia 09/13/2010   Essential hypertension 01/11/2009   DE QUERVAIN'S TENOSYNOVITIS 01/26/2008   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   REFERRING PROVIDER: Jannette Fogo, MD  REFERRING DIAG:   Posterior tibial tendinitis of left lower extremity  Tear of peroneal tendon, left, initial encounter  Impingement syndrome of left ankle  THERAPY DIAG:   Pain in left ankle and joints of left foot  Muscle weakness (generalized)  Ankle joint stiffness, left  Difficulty in walking, not elsewhere classified  Localized edema  Rationale for Evaluation and Treatment: Rehabilitation  ONSET DATE: Surgery Date 12/10/2022  SUBJECTIVE:   SUBJECTIVE STATEMENT:   Pt. Arrives to PT clinic FWB and prepared for evaluation session. Patient reports they are 7 weeks s/p L lateral ankle surgery following an injury during a Zumba class. Pt. Expressed having a history of bilateral ankle sprains with cheerleading and gymnastics participation in their past. Pt. Notes having PT in past with benefit but not liking dry-needling after past PT treatments. 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 Pt. Highlights that they would like to be able to get back to their normal routines and be able to trust their ankle to support them. Pt. Describes that they are excited and ready to begin physical therapy.  PERTINENT HISTORY:  Signs and Symptoms concordant with EDS. PAIN:   Are you having pain? Yes: NPRS scale: 3/10 Pain location: Left  Ankle Pain description: Dull/achy Aggravating factors: Long durations of weightbearing, AROM dorsiflexion/inversion Relieving factors: Ice and rest  PRECAUTIONS: None  RED FLAGS:  Signs and Sx's concordant with EDS variant    WEIGHT BEARING RESTRICTIONS:  No  FALLS:   Has patient fallen in last 6 months? 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  Patient demonstrates rounded shoulders and forward head posture.  Patient demonstrates anterior pelvic tilt/ MCI of pelvic stabilization musculature.  Pt. Surgical sites are clean, sterile, and healing well.  Pt. Demonstrates decreased strength in proximal hip musculature  Pt. Demonstrates P with all AROM for L ankle. NPS 4/10  Note: Objective measures were completed at Evaluation unless otherwise noted.  DIAGNOSTIC FINDINGS:   L Lateral ankle ROM/Strength Deficit  PATIENT SURVEYS:    LEFS: 27/80  COGNITION:  Overall cognitive status: Within functional limits for tasks assessed    SENSATION:  WFL  EDEMA:  Figure 8: R 53 cm/ L 60 cm  MUSCLE LENGTH:  NT  POSTURE: rounded shoulders  PALPATION:  Residual edema present in L lateral ankle.  Discussed scar massage/ incision healing.    LOWER EXTREMITY ROM:  Active ROM Right eval Left eval  Hip flexion    Hip extension    Hip abduction    Hip adduction    Hip internal rotation    Hip external rotation    Knee flexion    Knee extension    Ankle dorsiflexion 20 deg. 3 deg.  Ankle plantarflexion 50 deg. 40 deg.  Ankle inversion 30 deg. 18 deg.    Ankle eversion 20 deg. 8 deg.   (Blank rows = not tested)  LOWER EXTREMITY MMT:  MMT Right eval Left eval  Hip flexion 3 3  Hip extension 4 4  Hip abduction 3 3  Hip adduction    Hip internal rotation 4 4  Hip external rotation 4 4  Knee flexion 4 4  Knee extension 5 5  Ankle dorsiflexion 4 3  Ankle plantarflexion 5 3  Ankle inversion 5 3  Ankle eversion 5 3   (Blank rows = not tested)  LOWER EXTREMITY SPECIAL TESTS:   NT  FUNCTIONAL TESTS:   AROM: Ankle Dorsiflexion  GAIT: Distance walked: 50 ft. In clinic Assistive device utilized: None Level of assistance: Complete Independence Comments: Slight antalgic gait favoring weightbearing through LLE.  LEFS: 48  TREATMENT DATE: 03/03/2023  Subjective:  Pt. Arrives to PT with 5/10 NPS L lateral and medial ankle discomfort. Pt. Expresses that they are still going to the gym regularly. Pt. States that their return to work has been better since limiting total standing time and total walking throughout the day. Pt. Noted that they are starting their second week of work and are 12 weeks s/p L ankle surgery.  Therapeutic Exercise:  Static Dorsiflexion Heel walking 3 x 20 Ft.    Nustep L5 for 10 min. B LE only.  Discussed daily activity/ HEP.    Walking in hallway (varying cadences)- added high marching 2 laps.  Improved heel strike/ toe off on L.    Standing gastroc stretches on 1st step with static holds 10x B.    TG knee flexion (midline/ toe in/ toe out)- 20x.  Heel raises 20x with cuing for equal wt. Bearing.    Manual tx.:  Grade II AP L talocrural mobs 2x20  Grade III talocrural distraction 3x30   PROM with overpressure into R1, ten 3 second holds in all directions  Supine L ankle stretches (all planes)  STM to L gastrocnemius complex. MTP noted in medial/distal calf.    PATIENT EDUCATION:  Education  details: Pain management, scar management, activity limitations Person educated: Patient Education method: Explanation Education comprehension: verbalized understanding  HOME EXERCISE PROGRAM:  Access Code: [email or web address] URL: [email or web address] Date: 02/02/2023 Prepared by: Dorene Grebe  Exercises - Long Sitting Ankle Dorsiflexion with Anchored Resistance  - 1 x daily - 7 x weekly - 3 sets - 10 reps - Seated Ankle Eversion with Resistance  - 1 x daily - 7 x weekly - 3 sets - 10 reps - Seated Ankle Plantar Flexion with Resistance Loop  - 1 x daily - 7 x weekly - 3 sets - 10 reps - Seated Ankle Inversion with Anchored Resistance  - 1 x daily - 7 x weekly - 3 sets - 10 reps - Sidelying Hip Abduction with Resistance at Ankle  - 1 x daily - 7 x weekly - 3 sets - 10 reps - Side Stepping with Resistance at Thighs and Ankles  - 1 x daily - 7 x weekly - 3 sets - 10 reps  ASSESSMENT:  CLINICAL IMPRESSION:  Today's treatment session focused on pain management and all ROM ankle strength. Pt. Exhibited 5-6/10 NPS throughout session and PT provided education about limiting current exercise participation. Pt. Worked hard throughout treatment session despite having increased pain. Pt. Responded to talocrural joint mobilizations (distraction specifically) with an NPS decrease from 6/10 to 4/10. Pt. Demonstrated improvement with gait mechanics as there was a noticeable increase in total LLE weight bearing. Pt. Will benefit from skilled PT services to increase L ankle ROM/ strength to improve pain-free mobility/ return to work with no limitations.   OBJECTIVE IMPAIRMENTS: decreased mobility.   ACTIVITY LIMITATIONS: squatting  PARTICIPATION LIMITATIONS: occupation  PERSONAL FACTORS: Past/current experiences are also affecting patient's functional outcome.   REHAB POTENTIAL: Good  CLINICAL DECISION MAKING: Stable/uncomplicated  EVALUATION COMPLEXITY: Low   GOALS: Goals reviewed with  patient? Yes  SHORT TERM GOALS: Target date: 02/23/2023  Patient will be able to demonstrate 200 ft. of gait corrected walking with NPS of 2/10 or less to improve community mobility.    Baseline: 4/10 NPS pain with 25 ft. Of Walking. Goal status: Met 03/03/2023  2.  Patient independent with HEP to increase L ankle dorsiflexion AROM to >7 degrees to increase ground clearance during  gait cycle. Baseline: 3 degrees.  2/10: see above Goal status: Goal met   LONG TERM GOALS: Target date: 03/16/2023  Patient will be able to perform 10 bilateral forward lunges with NPS 0/10 to return to confidently performing functional ADLs/IADLs related to lifting/carrying objects. Baseline: NT Goal status: INITIAL  2.  Patient will demonstrate 15 degrees of L ankle dorsiflexion to increase ground clearance during gait and promote normalized gait pattern.  Baseline: 3 degrees Goal status: INITIAL  3.  Patient will increase proximal hip strength (ABD, ADD, IR/ER, EXT.) to 4/5 to increase global LE strength/stability. Baseline: 3/5 Goal status: INITIAL  4.  Patient will be able to jog in place for 2 minutes with no left ankle pain 0/10 NPS to promote return to gym based ex. Baseline:  Goal status: INITIAL  PLAN:  PT FREQUENCY: 2x/week  PT DURATION: 6 weeks  PLANNED INTERVENTIONS: 97110-Therapeutic exercises, 97530- Therapeutic activity, [id number]- Neuromuscular re-education, 97535- Self Care, 16109- Manual therapy, and 97116- Gait training  PLAN FOR NEXT SESSION: Add LE Balance activities to HEP.  Cammie Mcgee, PT, DPT # [phone] Physical Therapist - [facility]  03/03/2023, 6:09 PM   Milford Cage, SPT

## 2023-03-04 ENCOUNTER — Encounter: Payer: Self-pay | Admitting: Physical Therapy

## 2023-03-04 ENCOUNTER — Encounter: Payer: BC Managed Care – PPO | Admitting: Physical Therapy

## 2023-03-04 ENCOUNTER — Encounter: Payer: Self-pay | Admitting: Family Medicine

## 2023-03-04 ENCOUNTER — Ambulatory Visit: Payer: BC Managed Care – PPO | Admitting: Family Medicine

## 2023-03-04 VITALS — BP 136/89 | HR 90 | Wt 158.4 lb

## 2023-03-04 DIAGNOSIS — R41840 Attention and concentration deficit: Secondary | ICD-10-CM

## 2023-03-04 DIAGNOSIS — I10 Essential (primary) hypertension: Secondary | ICD-10-CM | POA: Diagnosis not present

## 2023-03-04 DIAGNOSIS — B009 Herpesviral infection, unspecified: Secondary | ICD-10-CM

## 2023-03-04 MED ORDER — ATOMOXETINE HCL 10 MG PO CAPS: 10.0000 mg | ORAL_CAPSULE | Freq: Every day | ORAL | 1 refills | Status: DC

## 2023-03-04 MED ORDER — VALACYCLOVIR HCL 1 G PO TABS: 1000.0000 mg | ORAL_TABLET | Freq: Every day | ORAL | 3 refills | Status: AC

## 2023-03-04 MED ORDER — OLMESARTAN MEDOXOMIL-HCTZ 20-12.5 MG PO TABS: 1.0000 | ORAL_TABLET | Freq: Every day | ORAL | 3 refills | Status: AC

## 2023-03-04 NOTE — Progress Notes (Signed)
 Patient presents for Annual.  LMP: Hysterectomy Last pap:  Hysterectomy  Contraception:  Hysterectomy Mammogram:  scheduled for 03/18/23 STD Screening: Declines Flu Vaccine : Declines  CC:  Denies any concerns

## 2023-03-04 NOTE — Progress Notes (Signed)
 Subjective:     Kimberly [name] is a 53 y.o. female and is here for a comprehensive physical exam. The patient reports problems - needs med refill. No current PCP . Has tested positive for ADD. Not on meds. She is having trouble focusing. Also, having signs of menopause. Notes that she has night sweats and hot flashes.     The following portions of the patient's history were reviewed and updated as appropriate: allergies, current medications, past family history, past medical history, past social history, past surgical history, and problem list.  Review of Systems Pertinent items noted in HPI and remainder of comprehensive ROS otherwise negative.   Objective:    BP 136/89   Pulse 90   Wt 158 lb 6.4 oz (71.8 kg)   LMP 01/09/2017 (Exact Date)   BMI 30.94 kg/m  General appearance: alert, cooperative, and appears stated age Head: Normocephalic, without obvious abnormality, atraumatic Neck: supple, symmetrical, trachea midline Lungs:  normal effort Heart: regular rate and rhythm Abdomen: soft, non-tender; bowel sounds normal; no masses,  no organomegaly Extremities: extremities normal, atraumatic, no cyanosis or edema Skin: Skin color, texture, turgor normal. No rashes or lesions Neurologic: Grossly normal    Assessment:    Healthy female exam.      Plan:   Problem List Items Addressed This Visit       Unprioritized   Essential hypertension - Primary   Rx refill given. BP is ok today, off meds.      Relevant Medications   olmesartan-hydrochlorothiazide (BENICAR HCT) 20-12.5 MG tablet   Attention or concentration deficit   Diagnosed at [facility]. Was not given meds. Add DHA. Trial of Straterra. Usual side effects reviewed.      Relevant Medications   atomoxetine (STRATTERA) 10 MG capsule   Other Visit Diagnoses       HSV (herpes simplex virus) infection       Relevant Medications   valACYclovir (VALTREX) 1000 MG tablet         See After Visit  Summary for Counseling Recommendations

## 2023-03-04 NOTE — Assessment & Plan Note (Signed)
 Rx refill given. BP is ok today, off meds.

## 2023-03-04 NOTE — Assessment & Plan Note (Signed)
 Diagnosed at [facility]. Was not given meds. Add DHA. Trial of Straterra. Usual side effects reviewed.

## 2023-03-05 ENCOUNTER — Ambulatory Visit: Payer: BC Managed Care – PPO | Admitting: Physical Therapy

## 2023-03-05 DIAGNOSIS — M25572 Pain in left ankle and joints of left foot: Secondary | ICD-10-CM | POA: Diagnosis not present

## 2023-03-05 DIAGNOSIS — M25672 Stiffness of left ankle, not elsewhere classified: Secondary | ICD-10-CM

## 2023-03-05 DIAGNOSIS — M6281 Muscle weakness (generalized): Secondary | ICD-10-CM

## 2023-03-05 DIAGNOSIS — R600 Localized edema: Secondary | ICD-10-CM

## 2023-03-05 DIAGNOSIS — R262 Difficulty in walking, not elsewhere classified: Secondary | ICD-10-CM

## 2023-03-05 NOTE — Therapy (Unsigned)
 OUTPATIENT PHYSICAL THERAPY LOWER EXTREMITY TREATMENT Physical Therapy Progress Note  Dates of reporting period  02/02/23   to   03/05/23   Patient Name: Kimberly [name] MRN: [id number] DOB:06-13-1970, 53 y.o., female  Today's Date: 03/05/2023  END OF SESSION:  PT End of Session - 03/05/23 1554     Visit Number 10    Number of Visits 12    Date for PT Re-Evaluation 03/16/23    PT Start Time 1600    PT Stop Time 1646    PT Time Calculation (min) 46 min    Behavior During Therapy [facility] for tasks assessed/performed            Past Medical History:  Diagnosis Date   Anorectal fistula    Anxiety    Arthritis    neck, hips   Chronic headaches    migraines   History of hypertension    since wt loss after gastric sleeve 2014--  no longer issue   Hx of adenomatous colonic polyps 03/28/2022   Hypertension    Irritable bowel syndrome    PONV (postoperative nausea and vomiting)    slow to wake up   Wears glasses    Past Surgical History:  Procedure Laterality Date   BREAST REDUCTION SURGERY  1996   CESAREAN SECTION  2001   COLONOSCOPY  10-18-2010   w/ bx's   ESOPHAGOGASTRODUODENOSCOPY N/A 06/29/2012   Procedure: ESOPHAGOGASTRODUODENOSCOPY (EGD);  Surgeon: Lodema Pilot, DO;  Location: [facility];  Service: General;  Laterality: N/A;   EVALUATION UNDER ANESTHESIA WITH ANAL FISTULECTOMY N/A 04/14/2014   Procedure: ANAL EXAM UNDER ANESTHESIA WITH  FISTULOTOMY;  Surgeon: Romie Levee, MD;  Location: [facility];  Service: General;  Laterality: N/A;   LAPAROSCOPIC BILATERAL SALPINGECTOMY N/A 06/29/2012   Procedure: LAPAROSCOPIC BILATERAL SALPINGECTOMY;  Surgeon: Reva Bores, MD;  Location: [facility];  Service: Gynecology;  Laterality: N/A;   LAPAROSCOPIC GASTRIC SLEEVE RESECTION N/A 06/29/2012   Procedure: LAPAROSCOPIC GASTRIC SLEEVE RESECTION;  Surgeon: Lodema Pilot, DO;  Location: [facility];  Service: General;  Laterality: N/A;  Laparoscopic Sleeve Gastrectomy with EGD    REDUCTION MAMMAPLASTY Bilateral    VAGINAL HYSTERECTOMY N/A 01/13/2017   Procedure: HYSTERECTOMY VAGINAL;  Surgeon: Reva Bores, MD;  Location: [facility];  Service: Gynecology;  Laterality: N/A;   Patient Active Problem List   Diagnosis Date Noted   Attention or concentration deficit 03/04/2023   Hx of adenomatous colonic polyps 03/28/2022   Muscle spasm 11/13/2017   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   Migraine without aura 09/13/2010   ANA positive 09/13/2010   Polyarthralgia 09/13/2010   Essential hypertension 01/11/2009   DE QUERVAIN'S TENOSYNOVITIS 01/26/2008   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   REFERRING PROVIDER: Jannette Fogo, MD  REFERRING DIAG:   Posterior tibial tendinitis of left lower extremity  Tear of peroneal tendon, left, initial encounter  Impingement syndrome of left ankle  THERAPY DIAG:   Pain in left ankle and joints of left foot  Muscle weakness (generalized)  Ankle joint stiffness, left  Difficulty in walking, not elsewhere classified  Localized edema  Rationale for Evaluation and Treatment: Rehabilitation  ONSET DATE: Surgery Date 12/10/2022  SUBJECTIVE:   SUBJECTIVE STATEMENT:   Pt. Arrives to PT clinic [facility] and prepared for evaluation session. Patient reports they are 7 weeks s/p L lateral ankle surgery following an injury during a Zumba class. Pt. Expressed having a history of bilateral ankle sprains with cheerleading and  gymnastics participation in their past. Pt. Notes having PT in past with benefit but not liking dry-needling after past PT treatments. 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 Pt. Highlights that they would like to be able to get back to their normal routines and be able to trust their ankle to support them. Pt. Describes that they are excited and ready to begin physical therapy.  PERTINENT  HISTORY:  Signs and Symptoms concordant with EDS. PAIN:   Are you having pain? Yes: NPRS scale: 3/10 Pain location: Left Ankle Pain description: Dull/achy Aggravating factors: Long durations of weightbearing, AROM dorsiflexion/inversion Relieving factors: Ice and rest  PRECAUTIONS: None  RED FLAGS:  Signs and Sx's concordant with EDS variant    WEIGHT BEARING RESTRICTIONS:  No  FALLS:   Has patient fallen in last 6 months? 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  Patient demonstrates rounded shoulders and forward head posture.  Patient demonstrates anterior pelvic tilt/ MCI of pelvic stabilization musculature.  Pt. Surgical sites are clean, sterile, and healing well.  Pt. Demonstrates decreased strength in proximal hip musculature  Pt. Demonstrates P with all AROM for L ankle. NPS 4/10  Note: Objective measures were completed at Evaluation unless otherwise noted.  DIAGNOSTIC FINDINGS:   L Lateral ankle ROM/Strength Deficit  PATIENT SURVEYS:    LEFS: 27/80  COGNITION:  Overall cognitive status: Within functional limits for tasks assessed    SENSATION:  WFL  EDEMA:  Figure 8: R 53 cm/ L 60 cm  MUSCLE LENGTH:  NT  POSTURE: rounded shoulders  PALPATION:  Residual edema present in L lateral ankle.  Discussed scar massage/ incision healing.    LOWER EXTREMITY ROM:  Active ROM Right eval Left eval  Hip flexion    Hip extension    Hip abduction    Hip adduction    Hip internal rotation    Hip external rotation    Knee flexion     Knee extension    Ankle dorsiflexion 20 deg. 3 deg.  Ankle plantarflexion 50 deg. 40 deg.  Ankle inversion 30 deg. 18 deg.   Ankle eversion 20 deg. 8 deg.   (Blank rows = not tested)  LOWER EXTREMITY MMT:  MMT Right eval Left eval  Hip flexion 3 3  Hip extension 4 4  Hip abduction 3 3  Hip adduction    Hip internal rotation 4 4  Hip external rotation 4 4  Knee flexion 4 4  Knee extension 5 5  Ankle dorsiflexion 4 3  Ankle plantarflexion 5 3  Ankle inversion 5 3  Ankle eversion 5 3   (Blank rows = not tested)  LOWER EXTREMITY SPECIAL TESTS:   NT  FUNCTIONAL TESTS:   AROM: Ankle Dorsiflexion  GAIT: Distance walked: 50 ft. In clinic Assistive device utilized: None Level of assistance: Complete Independence Comments: Slight antalgic gait favoring weightbearing through LLE.  LEFS: 48  TREATMENT DATE: 03/05/2023  Subjective:  Pt. Arrives to PT with 5/10 NPS L lateral and medial ankle discomfort with lateral ankle being more prevalent with walking. Pt. Expresses that they are still going to the gym regularly and regressed their exercises to being seated to limit increasing L ankle pain. Pt. States they were able to rest more at work today. Pt. Noted that they are starting their second week of work and are 12 weeks s/p L ankle surgery.   Therapeutic Activity:  - Blaze Pods - Focus Setting - Star floor pattern RTB around ankles resisted hip 4 ways. 3 sets of one minute.   - FFE lunge progression demonstration and pt. Replication of activity.   - L ankle AROM Plantarflexion 35 degrees - L ankle AROM Dorsiflexion 13 degrees   Therapeutic Exercise:  Static Dorsiflexion Heel walking 3 x 20 Ft.    Nustep L5 for 10 min. B LE only.  Discussed daily activity/ HEP.    Walking in hallway (varying cadences)- added high marching 2 laps.  Improved heel strike/ toe off on  L.    Standing gastroc stretches on 1st step with static holds 10x B.    TG knee flexion (midline/ toe in/ toe out)- 20x.  Heel raises 20x with cuing for equal wt. Bearing.    Manual tx.:  Grade II AP L talocrural mobs 2x20  Grade III talocrural distraction 3x30   PROM with overpressure into R1, ten 3 second holds in all directions  Supine L ankle stretches (all planes)  STM to L gastrocnemius complex. MTP noted in medial/distal calf.    PATIENT EDUCATION:  Education details: Pain management, scar management, activity limitations Person educated: Patient Education method: Explanation Education comprehension: verbalized understanding  HOME EXERCISE PROGRAM:  Access Code: [email or web address] URL: [email or web address] Date: 02/02/2023 Prepared by: Dorene Grebe  Exercises - Long Sitting Ankle Dorsiflexion with Anchored Resistance  - 1 x daily - 7 x weekly - 3 sets - 10 reps - Seated Ankle Eversion with Resistance  - 1 x daily - 7 x weekly - 3 sets - 10 reps - Seated Ankle Plantar Flexion with Resistance Loop  - 1 x daily - 7 x weekly - 3 sets - 10 reps - Seated Ankle Inversion with Anchored Resistance  - 1 x daily - 7 x weekly - 3 sets - 10 reps - Sidelying Hip Abduction with Resistance at Ankle  - 1 x daily - 7 x weekly - 3 sets - 10 reps - Side Stepping with Resistance at Thighs and Ankles  - 1 x daily - 7 x weekly - 3 sets - 10 reps  ASSESSMENT:  CLINICAL IMPRESSION:  Today's treatment session focused on pain management and all ROM ankle strength. Pt. Continued to exhibited 5-6/10 NPS throughout session and PT provided education about limiting current exercise participation. Pt. Worked hard throughout treatment session despite having increased pain. Pt. Responded to talocrural joint mobilizations (distraction specifically) with an NPS decrease from 6/10 to 4/10. Pt. Demonstrated increased pain 7/10 NPS ar end range dorsiflexion and plantarflexion in both open and  closed chain. Pt. Marked improvement with AROM for dorsiflexion and plantar flexion (see treatment note). Pt. Will benefit from skilled PT services to increase L ankle ROM/ strength to improve pain-free mobility/ return to work with no limitations.   OBJECTIVE IMPAIRMENTS: decreased mobility.   ACTIVITY LIMITATIONS: squatting  PARTICIPATION LIMITATIONS: occupation  PERSONAL FACTORS: Past/current experiences are also affecting patient's functional outcome.   REHAB POTENTIAL: Good  CLINICAL DECISION MAKING: Stable/uncomplicated  EVALUATION COMPLEXITY: Low   GOALS: Goals reviewed with patient? Yes  SHORT TERM GOALS: Target date: 02/23/2023  Patient will be able to demonstrate 200 ft. of gait corrected walking with NPS of 2/10 or less to improve community mobility.    Baseline: 4/10 NPS pain with 25 ft. Of Walking. Goal status: Met 03/03/2023  2.  Patient independent with HEP to increase L ankle dorsiflexion AROM to >7 degrees to increase ground clearance during gait cycle. Baseline: 3 degrees.  2/10: see above Goal status: Goal met   LONG TERM GOALS: Target date: 03/16/2023  Patient will be able to perform 10 bilateral forward lunges with NPS 0/10 to return to confidently performing functional ADLs/IADLs related to lifting/carrying objects. Baseline: NT Goal status: INITIAL  2.  Patient will demonstrate 15 degrees of L ankle dorsiflexion to increase ground clearance during gait and promote normalized gait pattern.  Baseline: 3 degrees Goal status: INITIAL  3.  Patient will increase proximal hip strength (ABD, ADD, IR/ER, EXT.) to 4/5 to increase global LE strength/stability. Baseline: 3/5 Goal status: INITIAL  4.  Patient will be able to jog in place for 2 minutes with no left ankle pain 0/10 NPS to promote return to gym based ex. Baseline:  Goal status: INITIAL  PLAN:  PT FREQUENCY: 2x/week  PT DURATION: 6 weeks  PLANNED INTERVENTIONS: 97110-Therapeutic exercises,  97530- Therapeutic activity, [id number]- Neuromuscular re-education, 97535- Self Care, [id number]- Manual therapy, and [id number]- Gait training  PLAN FOR NEXT SESSION: Assess ROM and pain at end range.   Cammie Mcgee, PT, DPT # [phone] Physical Therapist - [facility]  03/05/2023, 4:47 PM   Milford Cage, SPT

## 2023-03-09 ENCOUNTER — Encounter: Payer: BC Managed Care – PPO | Admitting: Physical Therapy

## 2023-03-10 ENCOUNTER — Encounter: Payer: Self-pay | Admitting: Physical Therapy

## 2023-03-10 ENCOUNTER — Ambulatory Visit: Payer: BC Managed Care – PPO | Attending: Orthopedic Surgery | Admitting: Physical Therapy

## 2023-03-10 DIAGNOSIS — R262 Difficulty in walking, not elsewhere classified: Secondary | ICD-10-CM | POA: Insufficient documentation

## 2023-03-10 DIAGNOSIS — M6281 Muscle weakness (generalized): Secondary | ICD-10-CM | POA: Diagnosis present

## 2023-03-10 DIAGNOSIS — M25672 Stiffness of left ankle, not elsewhere classified: Secondary | ICD-10-CM | POA: Insufficient documentation

## 2023-03-10 DIAGNOSIS — M25572 Pain in left ankle and joints of left foot: Secondary | ICD-10-CM | POA: Diagnosis present

## 2023-03-10 DIAGNOSIS — R600 Localized edema: Secondary | ICD-10-CM | POA: Diagnosis present

## 2023-03-10 NOTE — Therapy (Signed)
 OUTPATIENT PHYSICAL THERAPY LOWER EXTREMITY TREATMENT  Patient Name: Kimberly [name] MRN: [id number] DOB:September 26, 1970, 53 y.o., female  Today's Date: 03/11/2023  END OF SESSION:  PT End of Session - 03/10/23 1553     Visit Number 11    Number of Visits 12    Date for PT Re-Evaluation 03/16/23    PT Start Time 1553    PT Stop Time 1645    PT Time Calculation (min) 52 min    Behavior During Therapy [facility] for tasks assessed/performed            Past Medical History:  Diagnosis Date   Anorectal fistula    Anxiety    Arthritis    neck, hips   Chronic headaches    migraines   History of hypertension    since wt loss after gastric sleeve 2014--  no longer issue   Hx of adenomatous colonic polyps 03/28/2022   Hypertension    Irritable bowel syndrome    PONV (postoperative nausea and vomiting)    slow to wake up   Wears glasses    Past Surgical History:  Procedure Laterality Date   BREAST REDUCTION SURGERY  1996   CESAREAN SECTION  2001   COLONOSCOPY  10-18-2010   w/ bx's   ESOPHAGOGASTRODUODENOSCOPY N/A 06/29/2012   Procedure: ESOPHAGOGASTRODUODENOSCOPY (EGD);  Surgeon: Lodema Pilot, DO;  Location: [facility];  Service: General;  Laterality: N/A;   EVALUATION UNDER ANESTHESIA WITH ANAL FISTULECTOMY N/A 04/14/2014   Procedure: ANAL EXAM UNDER ANESTHESIA WITH  FISTULOTOMY;  Surgeon: Romie Levee, MD;  Location: [facility] [place];  Service: General;  Laterality: N/A;   LAPAROSCOPIC BILATERAL SALPINGECTOMY N/A 06/29/2012   Procedure: LAPAROSCOPIC BILATERAL SALPINGECTOMY;  Surgeon: Reva Bores, MD;  Location: [facility];  Service: Gynecology;  Laterality: N/A;   LAPAROSCOPIC GASTRIC SLEEVE RESECTION N/A 06/29/2012   Procedure: LAPAROSCOPIC GASTRIC SLEEVE RESECTION;  Surgeon: Lodema Pilot, DO;  Location: [facility];  Service: General;  Laterality: N/A;  Laparoscopic Sleeve Gastrectomy with EGD   REDUCTION MAMMAPLASTY Bilateral    VAGINAL HYSTERECTOMY N/A 01/13/2017   Procedure:  HYSTERECTOMY VAGINAL;  Surgeon: Reva Bores, MD;  Location: [facility];  Service: Gynecology;  Laterality: N/A;   Patient Active Problem List   Diagnosis Date Noted   Attention or concentration deficit 03/04/2023   Hx of adenomatous colonic polyps 03/28/2022   Muscle spasm 11/13/2017   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   Migraine without aura 09/13/2010   ANA positive 09/13/2010   Polyarthralgia 09/13/2010   Essential hypertension 01/11/2009   DE QUERVAIN'S TENOSYNOVITIS 01/26/2008   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   REFERRING PROVIDER: Jannette Fogo, MD  REFERRING DIAG:   Posterior tibial tendinitis of left lower extremity  Tear of peroneal tendon, left, initial encounter  Impingement syndrome of left ankle  THERAPY DIAG:   Pain in left ankle and joints of left foot  Muscle weakness (generalized)  Ankle joint stiffness, left  Difficulty in walking, not elsewhere classified  Localized edema  Rationale for Evaluation and Treatment: Rehabilitation  ONSET DATE: Surgery Date 12/10/2022  SUBJECTIVE:   SUBJECTIVE STATEMENT:   Pt. Arrives to PT clinic FWB and prepared for evaluation session. Patient reports they are 7 weeks s/p L lateral ankle surgery following an injury during a Zumba class. Pt. Expressed having a history of bilateral ankle sprains with cheerleading and gymnastics participation in their past. Pt. Notes having PT in past with benefit but not liking dry-needling after  past PT treatments. 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 Pt. Highlights that they would like to be able to get back to their normal routines and be able to trust their ankle to support them. Pt. Describes that they are excited and ready to begin physical therapy.  PERTINENT HISTORY:  Signs and Symptoms concordant with EDS. PAIN:   Are you having pain?  Yes: NPRS scale: 3/10 Pain location: Left Ankle Pain description: Dull/achy Aggravating factors: Long durations of weightbearing, AROM dorsiflexion/inversion Relieving factors: Ice and rest  PRECAUTIONS: None  RED FLAGS:  Signs and Sx's concordant with EDS variant    WEIGHT BEARING RESTRICTIONS:  No  FALLS:   Has patient fallen in last 6 months? 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  Patient demonstrates rounded shoulders and forward head posture.  Patient demonstrates anterior pelvic tilt/ MCI of pelvic stabilization musculature.  Pt. Surgical sites are clean, sterile, and healing well.  Pt. Demonstrates decreased strength in proximal hip musculature  Pt. Demonstrates P with all AROM for L ankle. NPS 4/10  Note: Objective measures were completed at Evaluation unless otherwise noted.  DIAGNOSTIC FINDINGS:   L Lateral ankle ROM/Strength Deficit  PATIENT SURVEYS:    LEFS: 27/80  COGNITION:  Overall cognitive status: Within functional limits for tasks assessed    SENSATION:  WFL  EDEMA:  Figure 8: R 53 cm/ L 60 cm  MUSCLE LENGTH:  NT  POSTURE: rounded shoulders  PALPATION:  Residual edema present in L lateral ankle.  Discussed scar massage/ incision healing.    LOWER EXTREMITY ROM:  Active ROM Right eval Left eval  Hip flexion    Hip extension    Hip abduction    Hip adduction    Hip internal rotation    Hip external rotation    Knee flexion    Knee extension    Ankle dorsiflexion 20 deg. 3 deg.  Ankle plantarflexion 50 deg. 40  deg.  Ankle inversion 30 deg. 18 deg.   Ankle eversion 20 deg. 8 deg.   (Blank rows = not tested)  LOWER EXTREMITY MMT:  MMT Right eval Left eval  Hip flexion 3 3  Hip extension 4 4  Hip abduction 3 3  Hip adduction    Hip internal rotation 4 4  Hip external rotation 4 4  Knee flexion 4 4  Knee extension 5 5  Ankle dorsiflexion 4 3  Ankle plantarflexion 5 3  Ankle inversion 5 3  Ankle eversion 5 3   (Blank rows = not tested)  LOWER EXTREMITY SPECIAL TESTS:   NT  FUNCTIONAL TESTS:   AROM: Ankle Dorsiflexion  GAIT: Distance walked: 50 ft. In clinic Assistive device utilized: None Level of assistance: Complete Independence Comments: Slight antalgic gait favoring weightbearing through LLE.  LEFS: 48  - L ankle AROM Plantarflexion 35 degrees - L ankle AROM Dorsiflexion 13 degrees  TREATMENT DATE: 03/11/2023  Subjective:  Pt. Arrives to PT with 4/10 NPS L lateral and medial ankle discomfort with lateral ankle being more prevalent with walking.   Pt. Had a great weekend with minimal L ankle issues.  Pt. Went to the gym last night with no issues.  13 weeks s/p L ankle surgery.  Therapeutic Exercise:  Nustep L5 for 10 min. B LE only.  Discussed daily activity/ HEP.   Walking in hallway (varying cadences)- added high marching 2 laps.  Improved heel strike/ toe off on L.  Tandem gait (forward/ backwards)- in //-bars  (pt. Had several episodes of sharp L ankle pain).    Seated L ankle isometrics (all planes)  Airex SLS (>30 sec.) with light to no UE assist.    Recip. Stairs with focus on descending/ eccentric control.  Slight increase in L ankle discomfort with DF/wt. Bearing.   PT discussed returning to a modified Zumba class (no ankle pain).    Manual tx.:  Seated L ankle stretches (all planes), focus on gastroc stretches.  STM to L gastrocnemius complex. No  trigger points noted.     PATIENT EDUCATION:  Education details: Pain management, scar management, activity limitations Person educated: Patient Education method: Explanation Education comprehension: verbalized understanding  HOME EXERCISE PROGRAM:  Access Code: [email or web address] URL: [email or web address] Date: 02/02/2023 Prepared by: Dorene Grebe  Exercises - Long Sitting Ankle Dorsiflexion with Anchored Resistance  - 1 x daily - 7 x weekly - 3 sets - 10 reps - Seated Ankle Eversion with Resistance  - 1 x daily - 7 x weekly - 3 sets - 10 reps - Seated Ankle Plantar Flexion with Resistance Loop  - 1 x daily - 7 x weekly - 3 sets - 10 reps - Seated Ankle Inversion with Anchored Resistance  - 1 x daily - 7 x weekly - 3 sets - 10 reps - Sidelying Hip Abduction with Resistance at Ankle  - 1 x daily - 7 x weekly - 3 sets - 10 reps - Side Stepping with Resistance at Thighs and Ankles  - 1 x daily - 7 x weekly - 3 sets - 10 reps  ASSESSMENT:  CLINICAL IMPRESSION:  Today's treatment session focused on pain management and ankle ROM/ stability.  Pt. Continued to exhibited 5-6/10 NPS throughout session.  Pt. Worked hard throughout treatment session despite having several episodes of sharp L ankle pain.  Pt. Will benefit from skilled PT services to increase L ankle ROM/ strength to improve pain-free mobility/ return to work with no limitations.   OBJECTIVE IMPAIRMENTS: decreased mobility.   ACTIVITY LIMITATIONS: squatting  PARTICIPATION LIMITATIONS: occupation  PERSONAL FACTORS: Past/current experiences are also affecting patient's functional outcome.   REHAB POTENTIAL: Good  CLINICAL DECISION MAKING: Stable/uncomplicated  EVALUATION COMPLEXITY: Low   GOALS: Goals reviewed with patient? Yes  SHORT TERM GOALS: Target date: 02/23/2023  Patient will be able to demonstrate 200 ft. of gait corrected walking with NPS of 2/10 or less to improve community mobility.    Baseline:  4/10 NPS pain with 25 ft. Of Walking. Goal status: Met 03/03/2023  2.  Patient independent with HEP to increase L ankle dorsiflexion AROM to >7 degrees to increase ground clearance during gait cycle. Baseline: 3 degrees.  2/10: see above Goal status: Goal met   LONG TERM GOALS: Target date: 03/16/2023  Patient will be able to perform 10 bilateral forward lunges with NPS 0/10 to return to confidently performing functional ADLs/IADLs related to lifting/carrying objects. Baseline:  NT Goal status: INITIAL  2.  Patient will demonstrate 15 degrees of L ankle dorsiflexion to increase ground clearance during gait and promote normalized gait pattern.  Baseline: 3 degrees Goal status: INITIAL  3.  Patient will increase proximal hip strength (ABD, ADD, IR/ER, EXT.) to 4/5 to increase global LE strength/stability. Baseline: 3/5 Goal status: INITIAL  4.  Patient will be able to jog in place for 2 minutes with no left ankle pain 0/10 NPS to promote return to gym based ex. Baseline:  Goal status: INITIAL  PLAN:  PT FREQUENCY: 2x/week  PT DURATION: 6 weeks  PLANNED INTERVENTIONS: 97110-Therapeutic exercises, 97530- Therapeutic activity, [id number]- Neuromuscular re-education, 97535- Self Care, [id number]- Manual therapy, and 97116- Gait training  PLAN FOR NEXT SESSION: Resisted gait/ ankle stability ex.  CHECK LTGs  Cammie Mcgee, PT, DPT # [id number] Physical Therapist - [facility]  03/11/2023, 1:27 PM

## 2023-03-12 ENCOUNTER — Ambulatory Visit: Payer: BC Managed Care – PPO | Admitting: Physical Therapy

## 2023-03-12 ENCOUNTER — Encounter: Payer: Self-pay | Admitting: Physical Therapy

## 2023-03-12 DIAGNOSIS — R262 Difficulty in walking, not elsewhere classified: Secondary | ICD-10-CM

## 2023-03-12 DIAGNOSIS — M25572 Pain in left ankle and joints of left foot: Secondary | ICD-10-CM

## 2023-03-12 DIAGNOSIS — R600 Localized edema: Secondary | ICD-10-CM

## 2023-03-12 DIAGNOSIS — M25672 Stiffness of left ankle, not elsewhere classified: Secondary | ICD-10-CM

## 2023-03-12 DIAGNOSIS — M6281 Muscle weakness (generalized): Secondary | ICD-10-CM

## 2023-03-12 NOTE — Therapy (Addendum)
 OUTPATIENT PHYSICAL THERAPY LOWER EXTREMITY TREATMENT  Patient Name: Kimberly [name] MRN: [id number] DOB:[birth year], 53 y.o., female  Today's Date: 03/12/2023  END OF SESSION:  PT End of Session - 03/12/23 1539     Visit Number 12    Number of Visits 12    Date for PT Re-Evaluation 03/16/23    PT Start Time 1553    PT Stop Time 1654    PT Time Calculation (min) 61 min    Behavior During Therapy [facility] for tasks assessed/performed            Past Medical History:  Diagnosis Date   Anorectal fistula    Anxiety    Arthritis    neck, hips   Chronic headaches    migraines   History of hypertension    since wt loss after gastric sleeve 2014--  no longer issue   Hx of adenomatous colonic polyps 03/28/2022   Hypertension    Irritable bowel syndrome    PONV (postoperative nausea and vomiting)    slow to wake up   Wears glasses    Past Surgical History:  Procedure Laterality Date   BREAST REDUCTION SURGERY  1996   CESAREAN SECTION  2001   COLONOSCOPY  10-18-2010   w/ bx's   ESOPHAGOGASTRODUODENOSCOPY N/A 06/29/2012   Procedure: ESOPHAGOGASTRODUODENOSCOPY (EGD);  Surgeon: Lodema Pilot, DO;  Location: [facility];  Service: General;  Laterality: N/A;   EVALUATION UNDER ANESTHESIA WITH ANAL FISTULECTOMY N/A 04/14/2014   Procedure: ANAL EXAM UNDER ANESTHESIA WITH  FISTULOTOMY;  Surgeon: Romie Levee, MD;  Location: [facility] [place];  Service: General;  Laterality: N/A;   LAPAROSCOPIC BILATERAL SALPINGECTOMY N/A 06/29/2012   Procedure: LAPAROSCOPIC BILATERAL SALPINGECTOMY;  Surgeon: Reva Bores, MD;  Location: [facility];  Service: Gynecology;  Laterality: N/A;   LAPAROSCOPIC GASTRIC SLEEVE RESECTION N/A 06/29/2012   Procedure: LAPAROSCOPIC GASTRIC SLEEVE RESECTION;  Surgeon: Lodema Pilot, DO;  Location: [facility];  Service: General;  Laterality: N/A;  Laparoscopic Sleeve Gastrectomy with EGD   REDUCTION MAMMAPLASTY Bilateral    VAGINAL HYSTERECTOMY N/A 01/13/2017   Procedure:  HYSTERECTOMY VAGINAL;  Surgeon: Reva Bores, MD;  Location: [facility];  Service: Gynecology;  Laterality: N/A;   Patient Active Problem List   Diagnosis Date Noted   Attention or concentration deficit 03/04/2023   Hx of adenomatous colonic polyps 03/28/2022   Muscle spasm 11/13/2017   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   Migraine without aura 09/13/2010   ANA positive 09/13/2010   Polyarthralgia 09/13/2010   Essential hypertension 01/11/2009   DE QUERVAIN'S TENOSYNOVITIS 01/26/2008   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   REFERRING PROVIDER: Jannette Fogo, MD  REFERRING DIAG:   Posterior tibial tendinitis of left lower extremity  Tear of peroneal tendon, left, initial encounter  Impingement syndrome of left ankle  THERAPY DIAG:   Pain in left ankle and joints of left foot  Muscle weakness (generalized)  Ankle joint stiffness, left  Difficulty in walking, not elsewhere classified  Localized edema  Rationale for Evaluation and Treatment: Rehabilitation  ONSET DATE: Surgery Date 12/10/2022  SUBJECTIVE:   SUBJECTIVE STATEMENT:   Pt. Arrives to PT clinic FWB and prepared for evaluation session. Patient reports they are 7 weeks s/p L lateral ankle surgery following an injury during a Zumba class. Pt. Expressed having a history of bilateral ankle sprains with cheerleading and gymnastics participation in their past. Pt. Notes having PT in past with benefit but not liking dry-needling after  past PT treatments. 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 Pt. Highlights that they would like to be able to get back to their normal routines and be able to trust their ankle to support them. Pt. Describes that they are excited and ready to begin physical therapy.  PERTINENT HISTORY:  Signs and Symptoms concordant with EDS. PAIN:   Are you having pain?  Yes: NPRS scale: 3/10 Pain location: Left Ankle Pain description: Dull/achy Aggravating factors: Long durations of weightbearing, AROM dorsiflexion/inversion Relieving factors: Ice and rest  PRECAUTIONS: None  RED FLAGS:  Signs and Sx's concordant with EDS variant    WEIGHT BEARING RESTRICTIONS:  No  FALLS:   Has patient fallen in last 6 months? 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  Patient demonstrates rounded shoulders and forward head posture.  Patient demonstrates anterior pelvic tilt/ MCI of pelvic stabilization musculature.  Pt. Surgical sites are clean, sterile, and healing well.  Pt. Demonstrates decreased strength in proximal hip musculature  Pt. Demonstrates P with all AROM for L ankle. NPS 4/10  Note: Objective measures were completed at Evaluation unless otherwise noted.  DIAGNOSTIC FINDINGS:   L Lateral ankle ROM/Strength Deficit  PATIENT SURVEYS:    LEFS: 27/80  COGNITION:  Overall cognitive status: Within functional limits for tasks assessed    SENSATION:  WFL  EDEMA:  Figure 8: R 53 cm/ L 60 cm  MUSCLE LENGTH:  NT  POSTURE: rounded shoulders  PALPATION:  Residual edema present in L lateral ankle.  Discussed scar massage/ incision healing.    LOWER EXTREMITY ROM:  Active ROM Right eval Left eval  Hip flexion    Hip extension    Hip abduction    Hip adduction    Hip internal rotation    Hip external rotation    Knee flexion    Knee extension    Ankle dorsiflexion 20 deg. 3 deg.  Ankle plantarflexion 50 deg. 40  deg.  Ankle inversion 30 deg. 18 deg.   Ankle eversion 20 deg. 8 deg.   (Blank rows = not tested)  LOWER EXTREMITY MMT:  MMT Right eval Left eval  Hip flexion 3 3  Hip extension 4 4  Hip abduction 3 3  Hip adduction    Hip internal rotation 4 4  Hip external rotation 4 4  Knee flexion 4 4  Knee extension 5 5  Ankle dorsiflexion 4 3  Ankle plantarflexion 5 3  Ankle inversion 5 3  Ankle eversion 5 3   (Blank rows = not tested)  LOWER EXTREMITY SPECIAL TESTS:   NT  FUNCTIONAL TESTS:   AROM: Ankle Dorsiflexion  GAIT: Distance walked: 50 ft. In clinic Assistive device utilized: None Level of assistance: Complete Independence Comments: Slight antalgic gait favoring weightbearing through LLE.  LEFS: 48  - L ankle AROM Plantarflexion 35 degrees - L ankle AROM Dorsiflexion 13 degrees  TREATMENT DATE: 03/12/2023  Subjective:  Pt. arrives to PT with 4/10 NPS L lateral and medial ankle discomfort with lateral ankle being more prevalent with walking.  Pt. Had a busy day at work and several episodes of sharp L lateral ankle pain. 13+ weeks s/p L ankle surgery.  Therapeutic Exercise:  Scifit L8 for 10 min. B LE only.  Discussed L ankle swelling after work/ HEP.   Nautilus: 70# all planes (5x each) at agility ladder.  No UE assist.    Walking in gym with consistent recip. heel strike/ toe off on L.  There.act.:  Tandem Airex gait (forward/ backwards/lateral)- in //-bars    Blaze pod SLS/ taps with Airex pad.  Random set-up in //-bars 1 min. X 2 on L and 1 min. On R.    PT discussed returning to a modified Zumba class on Saturday (no ankle pain).    Manual tx.:  Supine L ankle stretches (all planes), focus on gastroc/ DF stretches.  Increase L lateral ankle pain with IV/ DF stretches.    Ice to L ankle in supine after tx.    PATIENT EDUCATION:  Education  details: Pain management, scar management, activity limitations Person educated: Patient Education method: Explanation Education comprehension: verbalized understanding  HOME EXERCISE PROGRAM:  Access Code: [email or web address] URL: [email or web address] Date: 02/02/2023 Prepared by: Dorene Grebe  Exercises - Long Sitting Ankle Dorsiflexion with Anchored Resistance  - 1 x daily - 7 x weekly - 3 sets - 10 reps - Seated Ankle Eversion with Resistance  - 1 x daily - 7 x weekly - 3 sets - 10 reps - Seated Ankle Plantar Flexion with Resistance Loop  - 1 x daily - 7 x weekly - 3 sets - 10 reps - Seated Ankle Inversion with Anchored Resistance  - 1 x daily - 7 x weekly - 3 sets - 10 reps - Sidelying Hip Abduction with Resistance at Ankle  - 1 x daily - 7 x weekly - 3 sets - 10 reps - Side Stepping with Resistance at Thighs and Ankles  - 1 x daily - 7 x weekly - 3 sets - 10 reps  ASSESSMENT:  CLINICAL IMPRESSION:  Today's treatment session focused on L ankle ROM/ stability.  Pt. Continued to exhibited 4-6/10 NPS throughout session.  Pt. Worked hard throughout treatment session despite having several episodes of sharp L ankle pain.  Good ankle stability with higher level Airex ex./ resisted gait.  No LOB.  Pt. Will benefit from skilled PT services to increase L ankle ROM/ strength to improve pain-free mobility/ return to work with no limitations.   OBJECTIVE IMPAIRMENTS: decreased mobility.   ACTIVITY LIMITATIONS: squatting  PARTICIPATION LIMITATIONS: occupation  PERSONAL FACTORS: Past/current experiences are also affecting patient's functional outcome.   REHAB POTENTIAL: Good  CLINICAL DECISION MAKING: Stable/uncomplicated  EVALUATION COMPLEXITY: Low   GOALS: Goals reviewed with patient? Yes  SHORT TERM GOALS: Target date: 02/23/2023  Patient will be able to demonstrate 200 ft. of gait corrected walking with NPS of 2/10 or less to improve community mobility.    Baseline:  4/10 NPS pain with 25 ft. Of Walking. Goal status: Met 03/03/2023  2.  Patient independent with HEP to increase L ankle dorsiflexion AROM to >7 degrees to increase ground clearance during gait cycle. Baseline: 3 degrees.  2/10: see above Goal status: Goal met   LONG TERM GOALS: Target date: 03/16/2023  Patient will be able to perform 10 bilateral forward lunges with NPS 0/10 to return  to confidently performing functional ADLs/IADLs related to lifting/carrying objects. Baseline: NT Goal status: INITIAL  2.  Patient will demonstrate 15 degrees of L ankle dorsiflexion to increase ground clearance during gait and promote normalized gait pattern.  Baseline: 3 degrees Goal status: INITIAL  3.  Patient will increase proximal hip strength (ABD, ADD, IR/ER, EXT.) to 4/5 to increase global LE strength/stability. Baseline: 3/5 Goal status: INITIAL  4.  Patient will be able to jog in place for 2 minutes with no left ankle pain 0/10 NPS to promote return to gym based ex. Baseline:  Goal status: INITIAL  PLAN:  PT FREQUENCY: 2x/week  PT DURATION: 6 weeks  PLANNED INTERVENTIONS: 97110-Therapeutic exercises, 97530- Therapeutic activity, [id number]- Neuromuscular re-education, 97535- Self Care, 96045- Manual therapy, and 97116- Gait training  PLAN FOR NEXT SESSION: CHECK GOALS/ RECERT next tx.  Discuss POC  Cammie Mcgee, PT, DPT # [id number] Physical Therapist - [facility]  03/12/2023, 5:13 PM

## 2023-03-17 ENCOUNTER — Ambulatory Visit: Payer: BC Managed Care – PPO | Admitting: Physical Therapy

## 2023-03-17 DIAGNOSIS — M25672 Stiffness of left ankle, not elsewhere classified: Secondary | ICD-10-CM

## 2023-03-17 DIAGNOSIS — R600 Localized edema: Secondary | ICD-10-CM

## 2023-03-17 DIAGNOSIS — R262 Difficulty in walking, not elsewhere classified: Secondary | ICD-10-CM

## 2023-03-17 DIAGNOSIS — M6281 Muscle weakness (generalized): Secondary | ICD-10-CM

## 2023-03-17 DIAGNOSIS — M25572 Pain in left ankle and joints of left foot: Secondary | ICD-10-CM

## 2023-03-17 NOTE — Therapy (Signed)
 OUTPATIENT PHYSICAL THERAPY LOWER EXTREMITY TREATMENT/ RECERTIFICATION  Patient Name: Kimberly [name] MRN: [id number] DOB:1970/01/18, 53 y.o., female  Today's Date: 03/18/2023  END OF SESSION:  PT End of Session - 03/17/23 1503     Visit Number 13    Number of Visits 21    Date for PT Re-Evaluation 04/14/23    PT Start Time 1556    PT Stop Time 1646    PT Time Calculation (min) 50 min    Behavior During Therapy [facility] for tasks assessed/performed            Past Medical History:  Diagnosis Date   Anorectal fistula    Anxiety    Arthritis    neck, hips   Chronic headaches    migraines   History of hypertension    since wt loss after gastric sleeve 2014--  no longer issue   Hx of adenomatous colonic polyps 03/28/2022   Hypertension    Irritable bowel syndrome    PONV (postoperative nausea and vomiting)    slow to wake up   Wears glasses    Past Surgical History:  Procedure Laterality Date   BREAST REDUCTION SURGERY  1996   CESAREAN SECTION  2001   COLONOSCOPY  10-18-2010   w/ bx's   ESOPHAGOGASTRODUODENOSCOPY N/A 06/29/2012   Procedure: ESOPHAGOGASTRODUODENOSCOPY (EGD);  Surgeon: Lodema Pilot, DO;  Location: [facility];  Service: General;  Laterality: N/A;   EVALUATION UNDER ANESTHESIA WITH ANAL FISTULECTOMY N/A 04/14/2014   Procedure: ANAL EXAM UNDER ANESTHESIA WITH  FISTULOTOMY;  Surgeon: Romie Levee, MD;  Location: [facility];  Service: General;  Laterality: N/A;   LAPAROSCOPIC BILATERAL SALPINGECTOMY N/A 06/29/2012   Procedure: LAPAROSCOPIC BILATERAL SALPINGECTOMY;  Surgeon: Reva Bores, MD;  Location: [facility];  Service: Gynecology;  Laterality: N/A;   LAPAROSCOPIC GASTRIC SLEEVE RESECTION N/A 06/29/2012   Procedure: LAPAROSCOPIC GASTRIC SLEEVE RESECTION;  Surgeon: Lodema Pilot, DO;  Location: [facility];  Service: General;  Laterality: N/A;  Laparoscopic Sleeve Gastrectomy with EGD   REDUCTION MAMMAPLASTY Bilateral    VAGINAL HYSTERECTOMY N/A 01/13/2017    Procedure: HYSTERECTOMY VAGINAL;  Surgeon: Reva Bores, MD;  Location: [facility];  Service: Gynecology;  Laterality: N/A;   Patient Active Problem List   Diagnosis Date Noted   Attention or concentration deficit 03/04/2023   Hx of adenomatous colonic polyps 03/28/2022   Muscle spasm 11/13/2017   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   Migraine without aura 09/13/2010   ANA positive 09/13/2010   Polyarthralgia 09/13/2010   Essential hypertension 01/11/2009   DE QUERVAIN'S TENOSYNOVITIS 01/26/2008   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   REFERRING PROVIDER: Jannette Fogo, MD  REFERRING DIAG:   Posterior tibial tendinitis of left lower extremity  Tear of peroneal tendon, left, initial encounter  Impingement syndrome of left ankle  THERAPY DIAG:   Pain in left ankle and joints of left foot  Muscle weakness (generalized)  Ankle joint stiffness, left  Difficulty in walking, not elsewhere classified  Localized edema  Rationale for Evaluation and Treatment: Rehabilitation  ONSET DATE: Surgery Date 12/10/2022  SUBJECTIVE:   SUBJECTIVE STATEMENT:   Pt. Arrives to PT clinic FWB and prepared for evaluation session. Patient reports they are 7 weeks s/p L lateral ankle surgery following an injury during a Zumba class. Pt. Expressed having a history of bilateral ankle sprains with cheerleading and gymnastics participation in their past. Pt. Notes having PT in past with benefit but not liking dry-needling  after past PT treatments. 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 Pt. Highlights that they would like to be able to get back to their normal routines and be able to trust their ankle to support them. Pt. Describes that they are excited and ready to begin physical therapy.  PERTINENT HISTORY:  Signs and Symptoms concordant with EDS. PAIN:   Are you  having pain? Yes: NPRS scale: 3/10 Pain location: Left Ankle Pain description: Dull/achy Aggravating factors: Long durations of weightbearing, AROM dorsiflexion/inversion Relieving factors: Ice and rest  PRECAUTIONS: None  RED FLAGS:  Signs and Sx's concordant with EDS variant    WEIGHT BEARING RESTRICTIONS:  No  FALLS:   Has patient fallen in last 6 months? 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  Patient demonstrates rounded shoulders and forward head posture.  Patient demonstrates anterior pelvic tilt/ MCI of pelvic stabilization musculature.  Pt. Surgical sites are clean, sterile, and healing well.  Pt. Demonstrates decreased strength in proximal hip musculature  Pt. Demonstrates P with all AROM for L ankle. NPS 4/10  Note: Objective measures were completed at Evaluation unless otherwise noted.  DIAGNOSTIC FINDINGS:   L Lateral ankle ROM/Strength Deficit  PATIENT SURVEYS:    LEFS: 27/80  COGNITION:  Overall cognitive status: Within functional limits for tasks assessed    SENSATION:  WFL  EDEMA:  Figure 8: R 53 cm/ L 60 cm  MUSCLE LENGTH:  NT  POSTURE: rounded shoulders  PALPATION:  Residual edema present in L lateral ankle.  Discussed scar massage/ incision healing.    LOWER EXTREMITY ROM:  Active ROM Right eval Left eval  Hip flexion    Hip extension    Hip abduction    Hip adduction    Hip internal rotation    Hip external rotation    Knee flexion    Knee extension    Ankle dorsiflexion 20 deg. 3 deg.  Ankle  plantarflexion 50 deg. 40 deg.  Ankle inversion 30 deg. 18 deg.   Ankle eversion 20 deg. 8 deg.   (Blank rows = not tested)  LOWER EXTREMITY MMT:  MMT Right eval Left eval  Hip flexion 3 3  Hip extension 4 4  Hip abduction 3 3  Hip adduction    Hip internal rotation 4 4  Hip external rotation 4 4  Knee flexion 4 4  Knee extension 5 5  Ankle dorsiflexion 4 3  Ankle plantarflexion 5 3  Ankle inversion 5 3  Ankle eversion 5 3   (Blank rows = not tested)  LOWER EXTREMITY SPECIAL TESTS:   NT  FUNCTIONAL TESTS:   AROM: Ankle Dorsiflexion  GAIT: Distance walked: 50 ft. In clinic Assistive device utilized: None Level of assistance: Complete Independence Comments: Slight antalgic gait favoring weightbearing through LLE.  LEFS: 48  - L ankle AROM Plantarflexion 35 degrees - L ankle AROM Dorsiflexion 13 degrees  TREATMENT DATE: 03/18/2023  Subjective:  Pt. Completed a Zumba class last Saturday with no ankle pain in beginning but started feeling a "pinching on inside" and "burning on outside" about 30 minutes into class.  Pt. States her L ankle was hurting about 2 hours after ex. And iced/ rested ankle.  Pt. Reports a 3/10 L ankle right now and has had a good couple of days at work.   14+ weeks s/p L ankle surgery.  Therapeutic Exercise:  Walking in gym with consistent recip. heel strike/ toe off on L.  Scifit L8 for 10 min. B LE only.  Discussed L ankle swelling after work/ HEP.   Nautilus: 80# all planes (5x each) at agility ladder.  No UE assist.  Pinching in L medial/lateral ankle with backwards resisted walking.    L ankle AROM: DF 12 deg., PF 54 deg.  There.act.:  BOSU lunges/ step ups 5x each.  Reverse BOSU wt. Shifting with no UE assist/ squats 20x.   Manual tx.:  Supine L ankle stretches (all planes), focus on gastroc/ DF stretches.  Increase L lateral  ankle pain with IV/ DF stretches.     Not Today:  Tandem Airex gait (forward/ backwards/lateral)- in //-bars    Blaze pod SLS/ taps with Airex pad.  Random set-up in //-bars 1 min. X 2 on L and 1 min. On R.    Ice to L ankle in supine after tx.    PATIENT EDUCATION:  Education details: Pain management, scar management, activity limitations Person educated: Patient Education method: Explanation Education comprehension: verbalized understanding  HOME EXERCISE PROGRAM:  Access Code: [email or web address] URL: [email or web address] Date: 02/02/2023 Prepared by: Dorene Grebe  Exercises - Long Sitting Ankle Dorsiflexion with Anchored Resistance  - 1 x daily - 7 x weekly - 3 sets - 10 reps - Seated Ankle Eversion with Resistance  - 1 x daily - 7 x weekly - 3 sets - 10 reps - Seated Ankle Plantar Flexion with Resistance Loop  - 1 x daily - 7 x weekly - 3 sets - 10 reps - Seated Ankle Inversion with Anchored Resistance  - 1 x daily - 7 x weekly - 3 sets - 10 reps - Sidelying Hip Abduction with Resistance at Ankle  - 1 x daily - 7 x weekly - 3 sets - 10 reps - Side Stepping with Resistance at Thighs and Ankles  - 1 x daily - 7 x weekly - 3 sets - 10 reps  ASSESSMENT:  CLINICAL IMPRESSION:  Today's treatment session focused on L ankle ROM/ stability.  Pt. Continued to exhibited 3/10 NPS throughout session.  Pt. Worked hard throughout treatment session despite having several episodes of sharp L ankle pain.  Good ankle stability with higher level BOSU/ resisted gait.  No LOB.  Pt. Will benefit from skilled PT services to increase L ankle ROM/ strength to improve pain-free mobility/ return to work with no limitations.   OBJECTIVE IMPAIRMENTS: decreased mobility.   ACTIVITY LIMITATIONS: squatting  PARTICIPATION LIMITATIONS: occupation  PERSONAL FACTORS: Past/current experiences are also affecting patient's functional outcome.   REHAB POTENTIAL: Good  CLINICAL DECISION MAKING:  Stable/uncomplicated  EVALUATION COMPLEXITY: Low   GOALS: Goals reviewed with patient? Yes   LONG TERM GOALS: Target date: 04/14/2023  Patient will be able to perform 10 bilateral forward lunges with NPS 0/10 to return to confidently performing functional ADLs/IADLs related to lifting/carrying objects. Baseline: NT Goal status: Partially met  2.  Patient will demonstrate 15 degrees of L  ankle dorsiflexion to increase ground clearance during gait and promote normalized gait pattern.  Baseline: 3 degrees.  3/11: 12 deg. Goal status: Partially met  3.  Patient will increase proximal hip strength (ABD, ADD, IR/ER, EXT.) to 4/5 to increase global LE strength/stability. Baseline: 3/5 Goal status: INITIAL  4.  Patient will be able to jog in place for 2 minutes with no left ankle pain 0/10 NPS to promote return to gym based ex. Baseline:  Goal status: INITIAL  PLAN:  PT FREQUENCY: 2x/week  PT DURATION: 4 weeks  PLANNED INTERVENTIONS: 97110-Therapeutic exercises, 97530- Therapeutic activity, [id number]- Neuromuscular re-education, 97535- Self Care, 91478- Manual therapy, and 97116- Gait training  PLAN FOR NEXT SESSION: Reassess L ankle swelling.   Cammie Mcgee, PT, DPT # [phone] Physical Therapist - [facility]  03/18/2023, 5:00 PM

## 2023-03-18 ENCOUNTER — Ambulatory Visit
Admission: RE | Admit: 2023-03-18 | Discharge: 2023-03-18 | Disposition: A | Discharge status: Home / Self Care | Payer: BC Managed Care – PPO | Source: Ambulatory Visit

## 2023-03-18 DIAGNOSIS — Z1231 Encounter for screening mammogram for malignant neoplasm of breast: Secondary | ICD-10-CM

## 2023-03-19 ENCOUNTER — Ambulatory Visit: Payer: BC Managed Care – PPO | Admitting: Physical Therapy

## 2023-03-20 ENCOUNTER — Other Ambulatory Visit: Payer: Self-pay | Admitting: Physician Assistant

## 2023-03-22 ENCOUNTER — Encounter: Payer: Self-pay | Admitting: Family Medicine

## 2023-03-24 ENCOUNTER — Encounter: Payer: Self-pay | Admitting: Physical Therapy

## 2023-03-24 ENCOUNTER — Ambulatory Visit: Admitting: Physical Therapy

## 2023-03-24 DIAGNOSIS — M25572 Pain in left ankle and joints of left foot: Secondary | ICD-10-CM

## 2023-03-24 DIAGNOSIS — M6281 Muscle weakness (generalized): Secondary | ICD-10-CM

## 2023-03-24 DIAGNOSIS — M25672 Stiffness of left ankle, not elsewhere classified: Secondary | ICD-10-CM

## 2023-03-24 DIAGNOSIS — R600 Localized edema: Secondary | ICD-10-CM

## 2023-03-24 DIAGNOSIS — R262 Difficulty in walking, not elsewhere classified: Secondary | ICD-10-CM

## 2023-03-24 NOTE — Therapy (Signed)
 OUTPATIENT PHYSICAL THERAPY LOWER EXTREMITY TREATMENT  Patient Name: Kimberly [name] MRN: [id number] DOB:1970-06-22, 53 y.o., female  Today's Date: 03/24/2023  END OF SESSION:  PT End of Session - 03/24/23 1502     Visit Number 14    Number of Visits 21    Date for PT Re-Evaluation 04/14/23    PT Start Time 1509    PT Stop Time 1601    PT Time Calculation (min) 52 min    Behavior During Therapy [facility] for tasks assessed/performed            Past Medical History:  Diagnosis Date   Anorectal fistula    Anxiety    Arthritis    neck, hips   Chronic headaches    migraines   History of hypertension    since wt loss after gastric sleeve 2014--  no longer issue   Hx of adenomatous colonic polyps 03/28/2022   Hypertension    Irritable bowel syndrome    PONV (postoperative nausea and vomiting)    slow to wake up   Wears glasses    Past Surgical History:  Procedure Laterality Date   BREAST REDUCTION SURGERY  1996   CESAREAN SECTION  2001   COLONOSCOPY  10-18-2010   w/ bx's   ESOPHAGOGASTRODUODENOSCOPY N/A 06/29/2012   Procedure: ESOPHAGOGASTRODUODENOSCOPY (EGD);  Surgeon: Lodema Pilot, DO;  Location: [facility];  Service: General;  Laterality: N/A;   EVALUATION UNDER ANESTHESIA WITH ANAL FISTULECTOMY N/A 04/14/2014   Procedure: ANAL EXAM UNDER ANESTHESIA WITH  FISTULOTOMY;  Surgeon: Romie Levee, MD;  Location: [facility];  Service: General;  Laterality: N/A;   LAPAROSCOPIC BILATERAL SALPINGECTOMY N/A 06/29/2012   Procedure: LAPAROSCOPIC BILATERAL SALPINGECTOMY;  Surgeon: Reva Bores, MD;  Location: [facility];  Service: Gynecology;  Laterality: N/A;   LAPAROSCOPIC GASTRIC SLEEVE RESECTION N/A 06/29/2012   Procedure: LAPAROSCOPIC GASTRIC SLEEVE RESECTION;  Surgeon: Lodema Pilot, DO;  Location: [facility];  Service: General;  Laterality: N/A;  Laparoscopic Sleeve Gastrectomy with EGD   REDUCTION MAMMAPLASTY Bilateral    VAGINAL HYSTERECTOMY N/A 01/13/2017   Procedure:  HYSTERECTOMY VAGINAL;  Surgeon: Reva Bores, MD;  Location: [facility];  Service: Gynecology;  Laterality: N/A;   Patient Active Problem List   Diagnosis Date Noted   Attention or concentration deficit 03/04/2023   Hx of adenomatous colonic polyps 03/28/2022   Muscle spasm 11/13/2017   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   Migraine without aura 09/13/2010   ANA positive 09/13/2010   Polyarthralgia 09/13/2010   Essential hypertension 01/11/2009   DE QUERVAIN'S TENOSYNOVITIS 01/26/2008   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   REFERRING PROVIDER: Jannette Fogo, MD  REFERRING DIAG:   Posterior tibial tendinitis of left lower extremity  Tear of peroneal tendon, left, initial encounter  Impingement syndrome of left ankle  THERAPY DIAG:   Pain in left ankle and joints of left foot  Muscle weakness (generalized)  Ankle joint stiffness, left  Difficulty in walking, not elsewhere classified  Localized edema  Rationale for Evaluation and Treatment: Rehabilitation  ONSET DATE: Surgery Date 12/10/2022  SUBJECTIVE:   SUBJECTIVE STATEMENT:   Pt. Arrives to PT clinic FWB and prepared for evaluation session. Patient reports they are 7 weeks s/p L lateral ankle surgery following an injury during a Zumba class. Pt. Expressed having a history of bilateral ankle sprains with cheerleading and gymnastics participation in their past. Pt. Notes having PT in past with benefit but not liking dry-needling after  past PT treatments. 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 Pt. Highlights that they would like to be able to get back to their normal routines and be able to trust their ankle to support them. Pt. Describes that they are excited and ready to begin physical therapy.  PERTINENT HISTORY:  Signs and Symptoms concordant with EDS. PAIN:   Are you having pain?  Yes: NPRS scale: 3/10 Pain location: Left Ankle Pain description: Dull/achy Aggravating factors: Long durations of weightbearing, AROM dorsiflexion/inversion Relieving factors: Ice and rest  PRECAUTIONS: None  RED FLAGS:  Signs and Sx's concordant with EDS variant    WEIGHT BEARING RESTRICTIONS:  No  FALLS:   Has patient fallen in last 6 months? 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  Patient demonstrates rounded shoulders and forward head posture.  Patient demonstrates anterior pelvic tilt/ MCI of pelvic stabilization musculature.  Pt. Surgical sites are clean, sterile, and healing well.  Pt. Demonstrates decreased strength in proximal hip musculature  Pt. Demonstrates P with all AROM for L ankle. NPS 4/10  Note: Objective measures were completed at Evaluation unless otherwise noted.  DIAGNOSTIC FINDINGS:   L Lateral ankle ROM/Strength Deficit  PATIENT SURVEYS:    LEFS: 27/80  COGNITION:  Overall cognitive status: Within functional limits for tasks assessed    SENSATION:  WFL  EDEMA:  Figure 8: R 53 cm/ L 60 cm  MUSCLE LENGTH:  NT  POSTURE: rounded shoulders  PALPATION:  Residual edema present in L lateral ankle.  Discussed scar massage/ incision healing.    LOWER EXTREMITY ROM:  Active ROM Right eval Left eval  Hip flexion    Hip extension    Hip abduction    Hip adduction    Hip internal rotation    Hip external rotation    Knee flexion    Knee extension    Ankle dorsiflexion 20 deg. 3 deg.  Ankle plantarflexion 50 deg. 40  deg.  Ankle inversion 30 deg. 18 deg.   Ankle eversion 20 deg. 8 deg.   (Blank rows = not tested)  LOWER EXTREMITY MMT:  MMT Right eval Left eval  Hip flexion 3 3  Hip extension 4 4  Hip abduction 3 3  Hip adduction    Hip internal rotation 4 4  Hip external rotation 4 4  Knee flexion 4 4  Knee extension 5 5  Ankle dorsiflexion 4 3  Ankle plantarflexion 5 3  Ankle inversion 5 3  Ankle eversion 5 3   (Blank rows = not tested)  LOWER EXTREMITY SPECIAL TESTS:   NT  FUNCTIONAL TESTS:   AROM: Ankle Dorsiflexion  GAIT: Distance walked: 50 ft. In clinic Assistive device utilized: None Level of assistance: Complete Independence Comments: Slight antalgic gait favoring weightbearing through LLE.  LEFS: 48  - L ankle AROM Plantarflexion 35 degrees - L ankle AROM Dorsiflexion 13 degrees                03/17/23:  L ankle AROM: DF 12 deg., PF 54 deg.                                                                                             TREATMENT DATE: 03/24/2023  Subjective:  Pt. States her L ankle was great last Sunday.  Pt. Did a weights class last Saturday with no issues.  Pt. Reports her L ankle was swollen, "burning", "puffy" during the afternoon.   Pt. Reports a 3/10 L ankle right now and has had a good couple of days at work.  15+ weeks s/p L ankle surgery.  Therapeutic Exercise:  Supine L ankle DF/PF/IV/EV 3x each with moderate resistance.    Walking in gym with consistent recip. heel strike/ toe off on L.  Scifit L8 for 10 min. B LE only.  Discussed L ankle swelling after work/ HEP.   Nautilus: 95# forward/backwards and 80# lateral (5x each) at agility ladder.  No UE assist.      There.act.:  BOSU lunges/ step ups with added runner 5x each.  Reverse BOSU wt. Shifting with no UE assist/ squats 20x.   Rockerboard: forward/ backwards/ lateral 10x each.  No UE assist in //-bars.    Airex tandem gait: forward/backward 4x with light to no UE assist.     Manual tx.:  Supine L ankle stretches (all planes), focus on gastroc/ DF stretches.  Increase L lateral ankle pain with IV/ DF stretches.    STM with lotion to medial/ lateral aspects of ankle joint.  Tenderness present, esp. Over medial aspect.     Not Today:  Tandem Airex gait (forward/ backwards/lateral)- in //-bars    Blaze pod SLS/ taps with Airex pad.  Random set-up in //-bars 1 min. X 2 on L and 1 min. On R.    Ice to L ankle in supine after tx.    PATIENT EDUCATION:  Education details: Pain management, scar management, activity limitations Person educated: Patient Education method: Explanation Education comprehension: verbalized understanding  HOME EXERCISE PROGRAM:  Access Code: [email or web address] URL: [email or web address] Date: 02/02/2023 Prepared by: Dorene Grebe  Exercises - Long Sitting Ankle Dorsiflexion with Anchored Resistance  - 1 x daily - 7 x weekly - 3 sets - 10 reps - Seated Ankle Eversion with Resistance  - 1 x daily - 7 x weekly - 3 sets - 10 reps - Seated Ankle Plantar Flexion with Resistance Loop  - 1 x daily - 7 x weekly - 3 sets - 10 reps - Seated Ankle Inversion with Anchored Resistance  - 1 x daily - 7 x weekly - 3 sets - 10 reps - Sidelying Hip Abduction with Resistance at Ankle  - 1 x daily - 7 x weekly - 3 sets - 10 reps - Side Stepping with Resistance at Thighs and Ankles  - 1 x daily - 7 x weekly - 3 sets - 10 reps  ASSESSMENT:  CLINICAL IMPRESSION:  Today's treatment session focused on L ankle ROM/ stability.  Pt. Continued to exhibited 3/10 NPS throughout session.  Pt. Worked hard throughout treatment session despite having several episodes of sharp L ankle  pain.  Good ankle stability with higher level BOSU/ resisted gait.  No LOB.  Pt. Will benefit from skilled PT services to increase L ankle ROM/ strength to improve pain-free mobility/ return to work with no limitations.   OBJECTIVE IMPAIRMENTS: decreased mobility.    ACTIVITY LIMITATIONS: squatting  PARTICIPATION LIMITATIONS: occupation  PERSONAL FACTORS: Past/current experiences are also affecting patient's functional outcome.   REHAB POTENTIAL: Good  CLINICAL DECISION MAKING: Stable/uncomplicated  EVALUATION COMPLEXITY: Low   GOALS: Goals reviewed with patient? Yes   LONG TERM GOALS: Target date: 04/14/2023  Patient will be able to perform 10 bilateral forward lunges with NPS 0/10 to return to confidently performing functional ADLs/IADLs related to lifting/carrying objects. Baseline: NT Goal status: Partially met  2.  Patient will demonstrate 15 degrees of L ankle dorsiflexion to increase ground clearance during gait and promote normalized gait pattern.  Baseline: 3 degrees.  3/11: 12 deg. Goal status: Partially met  3.  Patient will increase proximal hip strength (ABD, ADD, IR/ER, EXT.) to 4/5 to increase global LE strength/stability. Baseline: 3/5 Goal status: INITIAL  4.  Patient will be able to jog in place for 2 minutes with no left ankle pain 0/10 NPS to promote return to gym based ex. Baseline:  Goal status: INITIAL  PLAN:  PT FREQUENCY: 2x/week  PT DURATION: 4 weeks  PLANNED INTERVENTIONS: 97110-Therapeutic exercises, 97530- Therapeutic activity, [id number]- Neuromuscular re-education, 97535- Self Care, 16109- Manual therapy, and 97116- Gait training  PLAN FOR NEXT SESSION: Reassess L ankle swelling.   Cammie Mcgee, PT, DPT # [phone] Physical Therapist - [facility]  03/24/2023, 6:31 PM

## 2023-03-26 ENCOUNTER — Ambulatory Visit: Admitting: Physical Therapy

## 2023-03-26 DIAGNOSIS — M25572 Pain in left ankle and joints of left foot: Secondary | ICD-10-CM | POA: Diagnosis not present

## 2023-03-26 DIAGNOSIS — M6281 Muscle weakness (generalized): Secondary | ICD-10-CM

## 2023-03-26 DIAGNOSIS — M25672 Stiffness of left ankle, not elsewhere classified: Secondary | ICD-10-CM

## 2023-03-26 DIAGNOSIS — R600 Localized edema: Secondary | ICD-10-CM

## 2023-03-26 DIAGNOSIS — R262 Difficulty in walking, not elsewhere classified: Secondary | ICD-10-CM

## 2023-03-26 NOTE — Therapy (Signed)
 OUTPATIENT PHYSICAL THERAPY LOWER EXTREMITY TREATMENT  Patient Name: Kimberly [name] MRN: [id number] DOB:1971/01/05, 53 y.o., female  Today's Date: 03/26/2023  END OF SESSION:  PT End of Session - 03/26/23 1606     Visit Number 15    Number of Visits 21    Date for PT Re-Evaluation 04/14/23    PT Start Time 1558    PT Stop Time 1649    PT Time Calculation (min) 51 min    Behavior During Therapy [facility] for tasks assessed/performed            Past Medical History:  Diagnosis Date   Anorectal fistula    Anxiety    Arthritis    neck, hips   Chronic headaches    migraines   History of hypertension    since wt loss after gastric sleeve 2014--  no longer issue   Hx of adenomatous colonic polyps 03/28/2022   Hypertension    Irritable bowel syndrome    PONV (postoperative nausea and vomiting)    slow to wake up   Wears glasses    Past Surgical History:  Procedure Laterality Date   BREAST REDUCTION SURGERY  1996   CESAREAN SECTION  2001   COLONOSCOPY  10-18-2010   w/ bx's   ESOPHAGOGASTRODUODENOSCOPY N/A 06/29/2012   Procedure: ESOPHAGOGASTRODUODENOSCOPY (EGD);  Surgeon: Lodema Pilot, DO;  Location: [facility];  Service: General;  Laterality: N/A;   EVALUATION UNDER ANESTHESIA WITH ANAL FISTULECTOMY N/A 04/14/2014   Procedure: ANAL EXAM UNDER ANESTHESIA WITH  FISTULOTOMY;  Surgeon: Romie Levee, MD;  Location: [facility];  Service: General;  Laterality: N/A;   LAPAROSCOPIC BILATERAL SALPINGECTOMY N/A 06/29/2012   Procedure: LAPAROSCOPIC BILATERAL SALPINGECTOMY;  Surgeon: Reva Bores, MD;  Location: [facility];  Service: Gynecology;  Laterality: N/A;   LAPAROSCOPIC GASTRIC SLEEVE RESECTION N/A 06/29/2012   Procedure: LAPAROSCOPIC GASTRIC SLEEVE RESECTION;  Surgeon: Lodema Pilot, DO;  Location: [facility];  Service: General;  Laterality: N/A;  Laparoscopic Sleeve Gastrectomy with EGD   REDUCTION MAMMAPLASTY Bilateral    VAGINAL HYSTERECTOMY N/A 01/13/2017   Procedure:  HYSTERECTOMY VAGINAL;  Surgeon: Reva Bores, MD;  Location: [facility];  Service: Gynecology;  Laterality: N/A;   Patient Active Problem List   Diagnosis Date Noted   Attention or concentration deficit 03/04/2023   Hx of adenomatous colonic polyps 03/28/2022   Muscle spasm 11/13/2017   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   Migraine without aura 09/13/2010   ANA positive 09/13/2010   Polyarthralgia 09/13/2010   Essential hypertension 01/11/2009   DE QUERVAIN'S TENOSYNOVITIS 01/26/2008   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   REFERRING PROVIDER: Jannette Fogo, MD  REFERRING DIAG:   Posterior tibial tendinitis of left lower extremity  Tear of peroneal tendon, left, initial encounter  Impingement syndrome of left ankle  THERAPY DIAG:   Pain in left ankle and joints of left foot  Muscle weakness (generalized)  Ankle joint stiffness, left  Difficulty in walking, not elsewhere classified  Localized edema  Rationale for Evaluation and Treatment: Rehabilitation  ONSET DATE: Surgery Date 12/10/2022  SUBJECTIVE:   SUBJECTIVE STATEMENT:   Pt. Arrives to PT clinic FWB and prepared for evaluation session. Patient reports they are 7 weeks s/p L lateral ankle surgery following an injury during a Zumba class. Pt. Expressed having a history of bilateral ankle sprains with cheerleading and gymnastics participation in their past. Pt. Notes having PT in past with benefit but not liking dry-needling after  past PT treatments. 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 Pt. Highlights that they would like to be able to get back to their normal routines and be able to trust their ankle to support them. Pt. Describes that they are excited and ready to begin physical therapy.  PERTINENT HISTORY:  Signs and Symptoms concordant with EDS. PAIN:   Are you having pain?  Yes: NPRS scale: 3/10 Pain location: Left Ankle Pain description: Dull/achy Aggravating factors: Long durations of weightbearing, AROM dorsiflexion/inversion Relieving factors: Ice and rest  PRECAUTIONS: None  RED FLAGS:  Signs and Sx's concordant with EDS variant    WEIGHT BEARING RESTRICTIONS:  No  FALLS:   Has patient fallen in last 6 months? 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  Patient demonstrates rounded shoulders and forward head posture.  Patient demonstrates anterior pelvic tilt/ MCI of pelvic stabilization musculature.  Pt. Surgical sites are clean, sterile, and healing well.  Pt. Demonstrates decreased strength in proximal hip musculature  Pt. Demonstrates P with all AROM for L ankle. NPS 4/10  Note: Objective measures were completed at Evaluation unless otherwise noted.  DIAGNOSTIC FINDINGS:   L Lateral ankle ROM/Strength Deficit  PATIENT SURVEYS:    LEFS: 27/80  COGNITION:  Overall cognitive status: Within functional limits for tasks assessed    SENSATION:  WFL  EDEMA:  Figure 8: R 53 cm/ L 60 cm  MUSCLE LENGTH:  NT  POSTURE: rounded shoulders  PALPATION:  Residual edema present in L lateral ankle.  Discussed scar massage/ incision healing.    LOWER EXTREMITY ROM:  Active ROM Right eval Left eval  Hip flexion    Hip extension    Hip abduction    Hip adduction    Hip internal rotation    Hip external rotation    Knee flexion    Knee extension    Ankle dorsiflexion 20 deg. 3 deg.  Ankle plantarflexion 50 deg. 40  deg.  Ankle inversion 30 deg. 18 deg.   Ankle eversion 20 deg. 8 deg.   (Blank rows = not tested)  LOWER EXTREMITY MMT:  MMT Right eval Left eval  Hip flexion 3 3  Hip extension 4 4  Hip abduction 3 3  Hip adduction    Hip internal rotation 4 4  Hip external rotation 4 4  Knee flexion 4 4  Knee extension 5 5  Ankle dorsiflexion 4 3  Ankle plantarflexion 5 3  Ankle inversion 5 3  Ankle eversion 5 3   (Blank rows = not tested)  LOWER EXTREMITY SPECIAL TESTS:   NT  FUNCTIONAL TESTS:   AROM: Ankle Dorsiflexion  GAIT: Distance walked: 50 ft. In clinic Assistive device utilized: None Level of assistance: Complete Independence Comments: Slight antalgic gait favoring weightbearing through LLE.  LEFS: 48  - L ankle AROM Plantarflexion 35 degrees - L ankle AROM Dorsiflexion 13 degrees                03/17/23:  L ankle AROM: DF 12 deg., PF 54 deg.                                                                                             TREATMENT DATE: 03/26/2023  Subjective:  Pt. Reports 5/10 L ankle pain right now and has been limited with gym based ex. Classes due to fatigue/L ankle discomfort.  Pt. Did well during Zumba the other night but was swollen in L lower leg the day after.        Therapeutic Exercise:  Nustep L5 10 min. B LE only.  Discussed recent gym based ex./ ankle swelling.   Walking in gym with consistent heel strike/ toe off (several laps in gym).  TG squats/ heel raises/ gastroc stretches (6/10 L ankle pain).    Supine L ankle DF/PF/IV/EV 3x each with moderate resistance.    There.act.:  Reverse BOSU lunges on L/R with static holds (pain tolerable).   Reverse BOSU wt. Shifting with no UE assist/ squats 20x.   Resisted gait in //-bars with 2BTB for 6x all 4-planes.  Slight increase in ankle discomfort.   Manual tx.:  L ankle fig.:  53 cm (improved since initial evaluation).    Supine L ankle stretches (all planes), focus on gastroc/ DF  stretches.  Increase L lateral ankle pain with IV/ DF stretches.     Not Today:  Tandem Airex gait (forward/ backwards/lateral)- in //-bars    Blaze pod SLS/ taps with Airex pad.  Random set-up in //-bars 1 min. X 2 on L and 1 min. On R.    Ice to L ankle in supine after tx.    PATIENT EDUCATION:  Education details: Pain management, scar management, activity limitations Person educated: Patient Education method: Explanation Education comprehension: verbalized understanding  HOME EXERCISE PROGRAM:  Access Code: [email or web address] URL: [email or web address] Date: 02/02/2023 Prepared by: Dorene Grebe  Exercises - Long Sitting Ankle Dorsiflexion with Anchored Resistance  - 1 x daily - 7 x weekly - 3 sets - 10 reps - Seated Ankle Eversion with Resistance  - 1 x daily - 7 x weekly - 3 sets - 10 reps - Seated Ankle Plantar Flexion with Resistance Loop  - 1 x daily - 7 x weekly - 3 sets - 10 reps - Seated Ankle Inversion with Anchored Resistance  - 1 x daily - 7 x weekly - 3 sets - 10 reps - Sidelying Hip Abduction with Resistance at Ankle  - 1 x daily - 7 x weekly - 3 sets - 10 reps - Side Stepping with Resistance at Thighs and Ankles  - 1 x daily - 7 x weekly - 3 sets - 10 reps  ASSESSMENT:  CLINICAL IMPRESSION:  Today's treatment session focused on L ankle ROM/ stability.  Pt. Continued to exhibited 5/10 NPS throughout session.  Pt. Worked hard throughout treatment session despite having several episodes of sharp L ankle pain.  Good ankle stability with higher level BOSU/ resisted gait.  No LOB.  Pt. Will benefit from skilled PT services to increase L ankle ROM/ strength to improve pain-free mobility/ return to work with  no limitations.   OBJECTIVE IMPAIRMENTS: decreased mobility.   ACTIVITY LIMITATIONS: squatting  PARTICIPATION LIMITATIONS: occupation  PERSONAL FACTORS: Past/current experiences are also affecting patient's functional outcome.   REHAB POTENTIAL:  Good  CLINICAL DECISION MAKING: Stable/uncomplicated  EVALUATION COMPLEXITY: Low   GOALS: Goals reviewed with patient? Yes   LONG TERM GOALS: Target date: 04/14/2023  Patient will be able to perform 10 bilateral forward lunges with NPS 0/10 to return to confidently performing functional ADLs/IADLs related to lifting/carrying objects. Baseline: NT Goal status: Partially met  2.  Patient will demonstrate 15 degrees of L ankle dorsiflexion to increase ground clearance during gait and promote normalized gait pattern.  Baseline: 3 degrees.  3/11: 12 deg. Goal status: Partially met  3.  Patient will increase proximal hip strength (ABD, ADD, IR/ER, EXT.) to 4/5 to increase global LE strength/stability. Baseline: 3/5 Goal status: INITIAL  4.  Patient will be able to jog in place for 2 minutes with no left ankle pain 0/10 NPS to promote return to gym based ex. Baseline:  Goal status: INITIAL  PLAN:  PT FREQUENCY: 2x/week  PT DURATION: 4 weeks  PLANNED INTERVENTIONS: 97110-Therapeutic exercises, 97530- Therapeutic activity, [id number]- Neuromuscular re-education, 97535- Self Care, 96045- Manual therapy, and 97116- Gait training  PLAN FOR NEXT SESSION: CHECK GOAL  Cammie Mcgee, PT, DPT # [phone] Physical Therapist - [facility]  03/26/2023, 5:14 PM

## 2023-03-31 ENCOUNTER — Encounter: Admitting: Physical Therapy

## 2023-04-02 ENCOUNTER — Ambulatory Visit: Admitting: Physical Therapy

## 2023-04-02 DIAGNOSIS — R600 Localized edema: Secondary | ICD-10-CM

## 2023-04-02 DIAGNOSIS — R262 Difficulty in walking, not elsewhere classified: Secondary | ICD-10-CM

## 2023-04-02 DIAGNOSIS — M6281 Muscle weakness (generalized): Secondary | ICD-10-CM

## 2023-04-02 DIAGNOSIS — M25572 Pain in left ankle and joints of left foot: Secondary | ICD-10-CM | POA: Diagnosis not present

## 2023-04-02 DIAGNOSIS — M25672 Stiffness of left ankle, not elsewhere classified: Secondary | ICD-10-CM

## 2023-04-02 NOTE — Therapy (Signed)
 OUTPATIENT PHYSICAL THERAPY LOWER EXTREMITY TREATMENT  Patient Name: Kimberly [name] MRN: [id number] DOB:[birth year], 53 y.o., female  Today's Date: 04/02/2023  END OF SESSION:  PT End of Session - 04/02/23 1501     Visit Number 16    Number of Visits 21    Date for PT Re-Evaluation 04/14/23    PT Start Time 1501    Behavior During Therapy [facility] for tasks assessed/performed            1501 to 1547  (46 minutes)  Past Medical History:  Diagnosis Date   Anorectal fistula    Anxiety    Arthritis    neck, hips   Chronic headaches    migraines   History of hypertension    since wt loss after gastric sleeve 2014--  no longer issue   Hx of adenomatous colonic polyps 03/28/2022   Hypertension    Irritable bowel syndrome    PONV (postoperative nausea and vomiting)    slow to wake up   Wears glasses    Past Surgical History:  Procedure Laterality Date   BREAST REDUCTION SURGERY  1996   CESAREAN SECTION  2001   COLONOSCOPY  10-18-2010   w/ bx's   ESOPHAGOGASTRODUODENOSCOPY N/A 06/29/2012   Procedure: ESOPHAGOGASTRODUODENOSCOPY (EGD);  Surgeon: Lodema Pilot, DO;  Location: [facility];  Service: General;  Laterality: N/A;   EVALUATION UNDER ANESTHESIA WITH ANAL FISTULECTOMY N/A 04/14/2014   Procedure: ANAL EXAM UNDER ANESTHESIA WITH  FISTULOTOMY;  Surgeon: Romie Levee, MD;  Location: [facility] [place];  Service: General;  Laterality: N/A;   LAPAROSCOPIC BILATERAL SALPINGECTOMY N/A 06/29/2012   Procedure: LAPAROSCOPIC BILATERAL SALPINGECTOMY;  Surgeon: Reva Bores, MD;  Location: [facility];  Service: Gynecology;  Laterality: N/A;   LAPAROSCOPIC GASTRIC SLEEVE RESECTION N/A 06/29/2012   Procedure: LAPAROSCOPIC GASTRIC SLEEVE RESECTION;  Surgeon: Lodema Pilot, DO;  Location: [facility];  Service: General;  Laterality: N/A;  Laparoscopic Sleeve Gastrectomy with EGD   REDUCTION MAMMAPLASTY Bilateral    VAGINAL HYSTERECTOMY N/A 01/13/2017   Procedure: HYSTERECTOMY VAGINAL;  Surgeon: Reva Bores, MD;  Location: [facility];  Service: Gynecology;  Laterality: N/A;   Patient Active Problem List   Diagnosis Date Noted   Attention or concentration deficit 03/04/2023   Hx of adenomatous colonic polyps 03/28/2022   Muscle spasm 11/13/2017   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   Migraine without aura 09/13/2010   ANA positive 09/13/2010   Polyarthralgia 09/13/2010   Essential hypertension 01/11/2009   DE QUERVAIN'S TENOSYNOVITIS 01/26/2008   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   REFERRING PROVIDER: Jannette Fogo, MD  REFERRING DIAG:   Posterior tibial tendinitis of left lower extremity  Tear of peroneal tendon, left, initial encounter  Impingement syndrome of left ankle  THERAPY DIAG:   Pain in left ankle and joints of left foot  Muscle weakness (generalized)  Ankle joint stiffness, left  Difficulty in walking, not elsewhere classified  Localized edema  Rationale for Evaluation and Treatment: Rehabilitation  ONSET DATE: Surgery Date 12/10/2022  SUBJECTIVE:   SUBJECTIVE STATEMENT:   Pt. Arrives to PT clinic FWB and prepared for evaluation session. Patient reports they are 7 weeks s/p L lateral ankle surgery following an injury during a Zumba class. Pt. Expressed having a history of bilateral ankle sprains with cheerleading and gymnastics participation in their past. Pt. Notes having PT in past with benefit but not liking dry-needling after past PT treatments. 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 Pt. Highlights that they would like to be able to get back to their normal routines and be able to trust their ankle to support them. Pt. Describes that they are excited and ready to begin physical therapy.  PERTINENT HISTORY:  Signs and Symptoms concordant with EDS. PAIN:   Are you having pain? Yes: NPRS scale: 3/10 Pain location:  Left Ankle Pain description: Dull/achy Aggravating factors: Long durations of weightbearing, AROM dorsiflexion/inversion Relieving factors: Ice and rest  PRECAUTIONS: None  RED FLAGS:  Signs and Sx's concordant with EDS variant    WEIGHT BEARING RESTRICTIONS:  No  FALLS:   Has patient fallen in last 6 months? 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  Patient demonstrates rounded shoulders and forward head posture.  Patient demonstrates anterior pelvic tilt/ MCI of pelvic stabilization musculature.  Pt. Surgical sites are clean, sterile, and healing well.  Pt. Demonstrates decreased strength in proximal hip musculature  Pt. Demonstrates P with all AROM for L ankle. NPS 4/10  Note: Objective measures were completed at Evaluation unless otherwise noted.  DIAGNOSTIC FINDINGS:   L Lateral ankle ROM/Strength Deficit  PATIENT SURVEYS:    LEFS: 27/80  COGNITION:  Overall cognitive status: Within functional limits for tasks assessed    SENSATION:  WFL  EDEMA:  Figure 8: R 53 cm/ L 60 cm  MUSCLE LENGTH:  NT  POSTURE: rounded shoulders  PALPATION:  Residual edema present in L lateral ankle.  Discussed scar massage/ incision healing.    LOWER EXTREMITY ROM:  Active ROM Right eval Left eval  Hip flexion    Hip extension    Hip abduction    Hip adduction    Hip internal rotation    Hip external rotation    Knee flexion    Knee extension    Ankle dorsiflexion 20 deg. 3 deg.  Ankle plantarflexion 50 deg. 40 deg.  Ankle inversion 30 deg. 18 deg.    Ankle eversion 20 deg. 8 deg.   (Blank rows = not tested)  LOWER EXTREMITY MMT:  MMT Right eval Left eval  Hip flexion 3 3  Hip extension 4 4  Hip abduction 3 3  Hip adduction    Hip internal rotation 4 4  Hip external rotation 4 4  Knee flexion 4 4  Knee extension 5 5  Ankle dorsiflexion 4 3  Ankle plantarflexion 5 3  Ankle inversion 5 3  Ankle eversion 5 3   (Blank rows = not tested)  LOWER EXTREMITY SPECIAL TESTS:   NT  FUNCTIONAL TESTS:   AROM: Ankle Dorsiflexion  GAIT: Distance walked: 50 ft. In clinic Assistive device utilized: None Level of assistance: Complete Independence Comments: Slight antalgic gait favoring weightbearing through LLE.  LEFS: 48  - L ankle AROM Plantarflexion 35 degrees - L ankle AROM Dorsiflexion 13 degrees                03/17/23:  L ankle AROM: DF 12 deg., PF 54 deg.  TREATMENT DATE: 04/02/2023  Subjective:  Pt. Reports 4-5/10 L ankle pain right now and had increase pain last night after body wt. Class (difficulty with planking, esp. Side planking).  Pt. Entered PT with slight L antalgic gait.  Pt. Reports balance issues while walking down hill, no issues with walking up hill.         Therapeutic Exercise:  Nustep L5 10 min. B LE only.  Discussed recent gym based ex./ ankle pain with planking/ ankle swelling.   Walking in gym with consistent heel strike/ toe off (several laps in gym).  Supine L ankle isometrics DF/PF/IV/EV 5x each with moderate resistance.    There.act.:  Blazepods: random on tandem Airex pad in //-bars (2 min. X 2).    Reverse BOSU wt. Shifting (forward/backwards/lateral) with no UE assist/ squats 20x.   BOSU step ups/downs on L LE in //-bars (10x each.).  SLS on R (30+ sec.).  L (30 sec.)- more L ankle movement.    Manual tx.:   Supine L ankle stretches (all planes), focus on gastroc/ DF stretches.  Increase L lateral ankle pain with IV/ DF stretches.     Ice to L ankle in supine.     Not Today:  Tandem Airex gait (forward/ backwards/lateral)- in //-bars    Resisted gait in //-bars with 2BTB for 6x all 4-planes.  Slight increase in ankle discomfort.   TG squats/ heel raises/ gastroc stretches (6/10 L ankle pain).     Ice to L ankle in supine after tx.    PATIENT EDUCATION:  Education details: Pain management, scar management, activity limitations Person educated: Patient Education method: Explanation Education comprehension: verbalized understanding  HOME EXERCISE PROGRAM:  Access Code: [email or web address] URL: [email or web address] Date: 02/02/2023 Prepared by: Dorene Grebe  Exercises - Long Sitting Ankle Dorsiflexion with Anchored Resistance  - 1 x daily - 7 x weekly - 3 sets - 10 reps - Seated Ankle Eversion with Resistance  - 1 x daily - 7 x weekly - 3 sets - 10 reps - Seated Ankle Plantar Flexion with Resistance Loop  - 1 x daily - 7 x weekly - 3 sets - 10 reps - Seated Ankle Inversion with Anchored Resistance  - 1 x daily - 7 x weekly - 3 sets - 10 reps - Sidelying Hip Abduction with Resistance at Ankle  - 1 x daily - 7 x weekly - 3 sets - 10 reps - Side Stepping with Resistance at Thighs and Ankles  - 1 x daily - 7 x weekly - 3 sets - 10 reps  ASSESSMENT:  CLINICAL IMPRESSION:  Today's treatment session focused on L ankle ROM/ stability.  Pt. Continued to exhibited 5/10 NPS throughout session.  Pt. Worked hard throughout treatment session despite having several episodes of sharp L ankle pain.  Good ankle stability with higher level BOSU/ resisted gait.  No LOB.  Pt. Will benefit from skilled PT services to increase L ankle ROM/ strength to improve pain-free mobility/ return to work with no limitations.   OBJECTIVE IMPAIRMENTS: decreased mobility.   ACTIVITY LIMITATIONS: squatting  PARTICIPATION LIMITATIONS: occupation  PERSONAL FACTORS: Past/current experiences are also affecting patient's functional  outcome.   REHAB POTENTIAL: Good  CLINICAL DECISION MAKING: Stable/uncomplicated  EVALUATION COMPLEXITY: Low   GOALS: Goals reviewed with patient? Yes   LONG TERM GOALS: Target date: 04/14/2023  Patient will be able to perform 10 bilateral forward lunges with NPS 0/10 to return to confidently performing functional ADLs/IADLs related to lifting/carrying objects. Baseline:  NT Goal status: Partially met  2.  Patient will demonstrate 15 degrees of L ankle dorsiflexion to increase ground clearance during gait and promote normalized gait pattern.  Baseline: 3 degrees.  3/11: 12 deg. Goal status: Partially met  3.  Patient will increase proximal hip strength (ABD, ADD, IR/ER, EXT.) to 4/5 to increase global LE strength/stability. Baseline: 3/5 Goal status: INITIAL  4.  Patient will be able to jog in place for 2 minutes with no left ankle pain 0/10 NPS to promote return to gym based ex. Baseline:  Goal status: INITIAL  PLAN:  PT FREQUENCY: 2x/week  PT DURATION: 4 weeks  PLANNED INTERVENTIONS: 97110-Therapeutic exercises, 97530- Therapeutic activity, [id number]- Neuromuscular re-education, 97535- Self Care, 16109- Manual therapy, and 97116- Gait training  PLAN FOR NEXT SESSION: CHECK GOAL  Cammie Mcgee, PT, DPT # [phone] Physical Therapist - [facility]  04/02/2023, 3:02 PM

## 2023-04-05 ENCOUNTER — Encounter: Payer: Self-pay | Admitting: Family Medicine

## 2023-04-05 DIAGNOSIS — R41840 Attention and concentration deficit: Secondary | ICD-10-CM

## 2023-04-07 ENCOUNTER — Encounter: Payer: Self-pay | Admitting: Physical Therapy

## 2023-04-07 ENCOUNTER — Ambulatory Visit: Attending: Orthopedic Surgery | Admitting: Physical Therapy

## 2023-04-07 DIAGNOSIS — R600 Localized edema: Secondary | ICD-10-CM | POA: Insufficient documentation

## 2023-04-07 DIAGNOSIS — R262 Difficulty in walking, not elsewhere classified: Secondary | ICD-10-CM | POA: Insufficient documentation

## 2023-04-07 DIAGNOSIS — M6281 Muscle weakness (generalized): Secondary | ICD-10-CM | POA: Insufficient documentation

## 2023-04-07 DIAGNOSIS — M25672 Stiffness of left ankle, not elsewhere classified: Secondary | ICD-10-CM | POA: Insufficient documentation

## 2023-04-07 DIAGNOSIS — M25572 Pain in left ankle and joints of left foot: Secondary | ICD-10-CM | POA: Diagnosis present

## 2023-04-07 MED ORDER — ATOMOXETINE HCL 18 MG PO CAPS: 18.0000 mg | ORAL_CAPSULE | Freq: Every day | ORAL | 1 refills | Status: DC

## 2023-04-07 NOTE — Therapy (Signed)
 OUTPATIENT PHYSICAL THERAPY LOWER EXTREMITY TREATMENT  Patient Name: Kimberly [name] MRN: [id number] DOB:10/13/1970, 53 y.o., female  Today's Date: 04/07/2023  END OF SESSION:  PT End of Session - 04/07/23 0734     Visit Number 17    Number of Visits 21    Date for PT Re-Evaluation 04/14/23    PT Start Time 0734    PT Stop Time 0834    PT Time Calculation (min) 60 min            Past Medical History:  Diagnosis Date   Anorectal fistula    Anxiety    Arthritis    neck, hips   Chronic headaches    migraines   History of hypertension    since wt loss after gastric sleeve 2014--  no longer issue   Hx of adenomatous colonic polyps 03/28/2022   Hypertension    Irritable bowel syndrome    PONV (postoperative nausea and vomiting)    slow to wake up   Wears glasses    Past Surgical History:  Procedure Laterality Date   BREAST REDUCTION SURGERY  1996   CESAREAN SECTION  2001   COLONOSCOPY  10-18-2010   w/ bx's   ESOPHAGOGASTRODUODENOSCOPY N/A 06/29/2012   Procedure: ESOPHAGOGASTRODUODENOSCOPY (EGD);  Surgeon: Lodema Pilot, DO;  Location: [facility];  Service: General;  Laterality: N/A;   EVALUATION UNDER ANESTHESIA WITH ANAL FISTULECTOMY N/A 04/14/2014   Procedure: ANAL EXAM UNDER ANESTHESIA WITH  FISTULOTOMY;  Surgeon: Romie Levee, MD;  Location: [facility];  Service: General;  Laterality: N/A;   LAPAROSCOPIC BILATERAL SALPINGECTOMY N/A 06/29/2012   Procedure: LAPAROSCOPIC BILATERAL SALPINGECTOMY;  Surgeon: Reva Bores, MD;  Location: [facility];  Service: Gynecology;  Laterality: N/A;   LAPAROSCOPIC GASTRIC SLEEVE RESECTION N/A 06/29/2012   Procedure: LAPAROSCOPIC GASTRIC SLEEVE RESECTION;  Surgeon: Lodema Pilot, DO;  Location: [facility];  Service: General;  Laterality: N/A;  Laparoscopic Sleeve Gastrectomy with EGD   REDUCTION MAMMAPLASTY Bilateral    VAGINAL HYSTERECTOMY N/A 01/13/2017   Procedure: HYSTERECTOMY VAGINAL;  Surgeon: Reva Bores, MD;  Location: [facility];   Service: Gynecology;  Laterality: N/A;   Patient Active Problem List   Diagnosis Date Noted   Attention or concentration deficit 03/04/2023   Hx of adenomatous colonic polyps 03/28/2022   Muscle spasm 11/13/2017   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   Migraine without aura 09/13/2010   ANA positive 09/13/2010   Polyarthralgia 09/13/2010   Essential hypertension 01/11/2009   DE QUERVAIN'S TENOSYNOVITIS 01/26/2008   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   REFERRING PROVIDER: Jannette Fogo, MD  REFERRING DIAG:   Posterior tibial tendinitis of left lower extremity  Tear of peroneal tendon, left, initial encounter  Impingement syndrome of left ankle  THERAPY DIAG:   Pain in left ankle and joints of left foot  Muscle weakness (generalized)  Ankle joint stiffness, left  Difficulty in walking, not elsewhere classified  Localized edema  Rationale for Evaluation and Treatment: Rehabilitation  ONSET DATE: Surgery Date 12/10/2022  SUBJECTIVE:   SUBJECTIVE STATEMENT:   Pt. Arrives to PT clinic FWB and prepared for evaluation session. Patient reports they are 7 weeks s/p L lateral ankle surgery following an injury during a Zumba class. Pt. Expressed having a history of bilateral ankle sprains with cheerleading and gymnastics participation in their past. Pt. Notes having PT in past with benefit but not liking dry-needling after past PT treatments. Pt. 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 Pt. Highlights that they would like to be able to get back to their normal routines and be able to trust their ankle to support them. Pt. Describes that they are excited and ready to begin physical therapy.  PERTINENT HISTORY:  Signs and Symptoms concordant with EDS. PAIN:   Are you having pain? Yes: NPRS scale: 3/10 Pain location: Left Ankle Pain description:  Dull/achy Aggravating factors: Long durations of weightbearing, AROM dorsiflexion/inversion Relieving factors: Ice and rest  PRECAUTIONS: None  RED FLAGS:  Signs and Sx's concordant with EDS variant    WEIGHT BEARING RESTRICTIONS:  No  FALLS:   Has patient fallen in last 6 months? 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  Patient demonstrates rounded shoulders and forward head posture.  Patient demonstrates anterior pelvic tilt/ MCI of pelvic stabilization musculature.  Pt. Surgical sites are clean, sterile, and healing well.  Pt. Demonstrates decreased strength in proximal hip musculature  Pt. Demonstrates P with all AROM for L ankle. NPS 4/10  Note: Objective measures were completed at Evaluation unless otherwise noted.  DIAGNOSTIC FINDINGS:   L Lateral ankle ROM/Strength Deficit  PATIENT SURVEYS:    LEFS: 27/80  COGNITION:  Overall cognitive status: Within functional limits for tasks assessed    SENSATION:  WFL  EDEMA:  Figure 8: R 53 cm/ L 60 cm  MUSCLE LENGTH:  NT  POSTURE: rounded shoulders  PALPATION:  Residual edema present in L lateral ankle.  Discussed scar massage/ incision healing.    LOWER EXTREMITY ROM:  Active ROM Right eval Left eval  Hip flexion    Hip extension    Hip abduction    Hip adduction    Hip internal rotation    Hip external rotation    Knee flexion    Knee extension    Ankle dorsiflexion 20 deg. 3 deg.  Ankle plantarflexion 50 deg. 40 deg.  Ankle inversion 30 deg. 18 deg.   Ankle eversion 20 deg. 8  deg.   (Blank rows = not tested)  LOWER EXTREMITY MMT:  MMT Right eval Left eval  Hip flexion 3 3  Hip extension 4 4  Hip abduction 3 3  Hip adduction    Hip internal rotation 4 4  Hip external rotation 4 4  Knee flexion 4 4  Knee extension 5 5  Ankle dorsiflexion 4 3  Ankle plantarflexion 5 3  Ankle inversion 5 3  Ankle eversion 5 3   (Blank rows = not tested)  LOWER EXTREMITY SPECIAL TESTS:   NT  FUNCTIONAL TESTS:   AROM: Ankle Dorsiflexion  GAIT: Distance walked: 50 ft. In clinic Assistive device utilized: None Level of assistance: Complete Independence Comments: Slight antalgic gait favoring weightbearing through LLE.  LEFS: 48  - L ankle AROM Plantarflexion 35 degrees - L ankle AROM Dorsiflexion 13 degrees                03/17/23:  L ankle AROM: DF 12 deg., PF 54 deg.  TREATMENT DATE: 04/07/2023  Subjective:  Pt. Reports 2/10 L ankle pain right now and had a great "break through" weekend with L ankle.  Pt. Walked 10,000 steps for 1st time since surgery over weekend.   Pt. Entered PT with more normalized gait and less ankle swelling noted.         Therapeutic Exercise:  Standing heel/ toe raises in //-bars.  Heel and toe walking in //-bars (slight discomfort with heel raises as compared to toe raises).    Nustep L6 10 min. B LE only.  Discussed gym based ex./ weekend activities.  Walking in gym with consistent heel strike/ toe off (several laps in gym/ hallway).  Walking marching/ alt. UE and LE touches in hallway.    TG knee flexion 10x/ heel raises 15x (PT reassessment ankle motion/ gastroc length).    Discussed gym based ex.    There.act.:  R/L step ups on 6" step with Airex pad at stairs 20x with no UE assist.    BOSU lunges on L/R in //-bars with static holds 10x each.     Reverse BOSU wt. Shifting (forward/backwards/lateral) with no UE assist/ squats 20x.   Ball tossing with good L ankle stability.     BOSU step ups/downs on L and R LE in //-bars (10x each.).  Good ankle control without UE assist.    Manual tx.:   Supine L ankle stretches (all planes), focus on gastroc/ DF stretches.  Increase L lateral ankle pain with IV/ DF stretches.    Reassessment of L ankle mobility at joint.    Pt. Will ice L ankle at work.      Not Today:  Tandem Airex gait (forward/ backwards/lateral)- in //-bars    Resisted gait in //-bars with 2BTB for 6x all 4-planes.  Slight increase in ankle discomfort.    PATIENT EDUCATION:  Education details: Pain management, scar management, activity limitations Person educated: Patient Education method: Explanation Education comprehension: verbalized understanding  HOME EXERCISE PROGRAM:  Access Code: [email or web address] URL: [email or web address] Date: 02/02/2023 Prepared by: Dorene Grebe  Exercises - Long Sitting Ankle Dorsiflexion with Anchored Resistance  - 1 x daily - 7 x weekly - 3 sets - 10 reps - Seated Ankle Eversion with Resistance  - 1 x daily - 7 x weekly - 3 sets - 10 reps - Seated Ankle Plantar Flexion with Resistance Loop  - 1 x daily - 7 x weekly - 3 sets - 10 reps - Seated Ankle Inversion with Anchored Resistance  - 1 x daily - 7 x weekly - 3 sets - 10 reps - Sidelying Hip Abduction with Resistance at Ankle  - 1 x daily - 7 x weekly - 3 sets - 10 reps - Side Stepping with Resistance at Thighs and Ankles  - 1 x daily - 7 x weekly - 3 sets - 10 reps  ASSESSMENT:  CLINICAL IMPRESSION:  Today's treatment session focused on L ankle ROM/ stability.  Pts. L ankle pain increased with progressive stability ex./ end-range IV and EV stretches.  Pt. Worked hard throughout treatment session despite having several episodes of sharp L ankle pain.  Good ankle stability with higher level BOSU/ Airex ex.  No LOB.  Pt. Will benefit from skilled PT services to increase L ankle ROM/ strength to improve pain-free mobility/ return to work with no  limitations.   OBJECTIVE IMPAIRMENTS: decreased mobility.   ACTIVITY LIMITATIONS: squatting  PARTICIPATION LIMITATIONS: occupation  PERSONAL FACTORS: Past/current experiences are also affecting patient's functional  outcome.   REHAB POTENTIAL: Good  CLINICAL DECISION MAKING: Stable/uncomplicated  EVALUATION COMPLEXITY: Low   GOALS: Goals reviewed with patient? Yes   LONG TERM GOALS: Target date: 04/14/2023  Patient will be able to perform 10 bilateral forward lunges with NPS 0/10 to return to confidently performing functional ADLs/IADLs related to lifting/carrying objects. Baseline: NT Goal status: Partially met  2.  Patient will demonstrate 15 degrees of L ankle dorsiflexion to increase ground clearance during gait and promote normalized gait pattern.  Baseline: 3 degrees.  3/11: 12 deg. Goal status: Partially met  3.  Patient will increase proximal hip strength (ABD, ADD, IR/ER, EXT.) to 4/5 to increase global LE strength/stability. Baseline: 3/5 Goal status: INITIAL  4.  Patient will be able to jog in place for 2 minutes with no left ankle pain 0/10 NPS to promote return to gym based ex. Baseline:  Goal status: INITIAL  PLAN:  PT FREQUENCY: 2x/week  PT DURATION: 4 weeks  PLANNED INTERVENTIONS: 97110-Therapeutic exercises, 97530- Therapeutic activity, [id number]- Neuromuscular re-education, 97535- Self Care, 29528- Manual therapy, and 97116- Gait training  PLAN FOR NEXT SESSION: CHECK GOAL/ MMT LE  Cammie Mcgee, PT, DPT # [phone] Physical Therapist - [facility]  04/07/2023, 8:35 AM

## 2023-04-09 ENCOUNTER — Ambulatory Visit: Admitting: Physical Therapy

## 2023-04-09 DIAGNOSIS — M25572 Pain in left ankle and joints of left foot: Secondary | ICD-10-CM | POA: Diagnosis not present

## 2023-04-09 DIAGNOSIS — M6281 Muscle weakness (generalized): Secondary | ICD-10-CM

## 2023-04-09 DIAGNOSIS — M25672 Stiffness of left ankle, not elsewhere classified: Secondary | ICD-10-CM

## 2023-04-09 DIAGNOSIS — R262 Difficulty in walking, not elsewhere classified: Secondary | ICD-10-CM

## 2023-04-09 DIAGNOSIS — R600 Localized edema: Secondary | ICD-10-CM

## 2023-04-09 NOTE — Therapy (Signed)
 OUTPATIENT PHYSICAL THERAPY LOWER EXTREMITY TREATMENT  Patient Name: Kimberly [name] MRN: [id number] DOB:[birth year], 53 y.o., female  Today's Date: 04/09/2023  END OF SESSION:  PT End of Session - 04/09/23 1510     Visit Number 18    Number of Visits 21    Date for PT Re-Evaluation 04/14/23    PT Start Time 1510    PT Stop Time 1600    PT Time Calculation (min) 50 min            Past Medical History:  Diagnosis Date   Anorectal fistula    Anxiety    Arthritis    neck, hips   Chronic headaches    migraines   History of hypertension    since wt loss after gastric sleeve 2014--  no longer issue   Hx of adenomatous colonic polyps 03/28/2022   Hypertension    Irritable bowel syndrome    PONV (postoperative nausea and vomiting)    slow to wake up   Wears glasses    Past Surgical History:  Procedure Laterality Date   BREAST REDUCTION SURGERY  1996   CESAREAN SECTION  2001   COLONOSCOPY  10-18-2010   w/ bx's   ESOPHAGOGASTRODUODENOSCOPY N/A 06/29/2012   Procedure: ESOPHAGOGASTRODUODENOSCOPY (EGD);  Surgeon: Lodema Pilot, DO;  Location: [facility];  Service: General;  Laterality: N/A;   EVALUATION UNDER ANESTHESIA WITH ANAL FISTULECTOMY N/A 04/14/2014   Procedure: ANAL EXAM UNDER ANESTHESIA WITH  FISTULOTOMY;  Surgeon: Romie Levee, MD;  Location: [facility];  Service: General;  Laterality: N/A;   LAPAROSCOPIC BILATERAL SALPINGECTOMY N/A 06/29/2012   Procedure: LAPAROSCOPIC BILATERAL SALPINGECTOMY;  Surgeon: Reva Bores, MD;  Location: [facility];  Service: Gynecology;  Laterality: N/A;   LAPAROSCOPIC GASTRIC SLEEVE RESECTION N/A 06/29/2012   Procedure: LAPAROSCOPIC GASTRIC SLEEVE RESECTION;  Surgeon: Lodema Pilot, DO;  Location: [facility];  Service: General;  Laterality: N/A;  Laparoscopic Sleeve Gastrectomy with EGD   REDUCTION MAMMAPLASTY Bilateral    VAGINAL HYSTERECTOMY N/A 01/13/2017   Procedure: HYSTERECTOMY VAGINAL;  Surgeon: Reva Bores, MD;  Location: [facility];   Service: Gynecology;  Laterality: N/A;   Patient Active Problem List   Diagnosis Date Noted   Attention or concentration deficit 03/04/2023   Hx of adenomatous colonic polyps 03/28/2022   Muscle spasm 11/13/2017   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   Migraine without aura 09/13/2010   ANA positive 09/13/2010   Polyarthralgia 09/13/2010   Essential hypertension 01/11/2009   DE QUERVAIN'S TENOSYNOVITIS 01/26/2008   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   REFERRING PROVIDER: Jannette Fogo, MD  REFERRING DIAG:   Posterior tibial tendinitis of left lower extremity  Tear of peroneal tendon, left, initial encounter  Impingement syndrome of left ankle  THERAPY DIAG:   Pain in left ankle and joints of left foot  Muscle weakness (generalized)  Ankle joint stiffness, left  Difficulty in walking, not elsewhere classified  Localized edema  Rationale for Evaluation and Treatment: Rehabilitation  ONSET DATE: Surgery Date 12/10/2022  SUBJECTIVE:   SUBJECTIVE STATEMENT:   Pt. Arrives to PT clinic FWB and prepared for evaluation session. Patient reports they are 7 weeks s/p L lateral ankle surgery following an injury during a Zumba class. Pt. Expressed having a history of bilateral ankle sprains with cheerleading and gymnastics participation in their past. Pt. Notes having PT in past with benefit but not liking dry-needling after past PT treatments. Pt. 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 Pt. Highlights that they would like to be able to get back to their normal routines and be able to trust their ankle to support them. Pt. Describes that they are excited and ready to begin physical therapy.  PERTINENT HISTORY:  Signs and Symptoms concordant with EDS. PAIN:   Are you having pain? Yes: NPRS scale: 3/10 Pain location: Left Ankle Pain description:  Dull/achy Aggravating factors: Long durations of weightbearing, AROM dorsiflexion/inversion Relieving factors: Ice and rest  PRECAUTIONS: None  RED FLAGS:  Signs and Sx's concordant with EDS variant    WEIGHT BEARING RESTRICTIONS:  No  FALLS:   Has patient fallen in last 6 months? 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  Patient demonstrates rounded shoulders and forward head posture.  Patient demonstrates anterior pelvic tilt/ MCI of pelvic stabilization musculature.  Pt. Surgical sites are clean, sterile, and healing well.  Pt. Demonstrates decreased strength in proximal hip musculature  Pt. Demonstrates P with all AROM for L ankle. NPS 4/10  Note: Objective measures were completed at Evaluation unless otherwise noted.  DIAGNOSTIC FINDINGS:   L Lateral ankle ROM/Strength Deficit  PATIENT SURVEYS:    LEFS: 27/80  COGNITION:  Overall cognitive status: Within functional limits for tasks assessed    SENSATION:  WFL  EDEMA:  Figure 8: R 53 cm/ L 60 cm  MUSCLE LENGTH:  NT  POSTURE: rounded shoulders  PALPATION:  Residual edema present in L lateral ankle.  Discussed scar massage/ incision healing.    LOWER EXTREMITY ROM:  Active ROM Right eval Left eval  Hip flexion    Hip extension    Hip abduction    Hip adduction    Hip internal rotation    Hip external rotation    Knee flexion    Knee extension    Ankle dorsiflexion 20 deg. 3 deg.  Ankle plantarflexion 50 deg. 40 deg.  Ankle inversion 30 deg. 18 deg.   Ankle eversion 20 deg. 8  deg.   (Blank rows = not tested)  LOWER EXTREMITY MMT:  MMT Right eval Left eval  Hip flexion 3 3  Hip extension 4 4  Hip abduction 3 3  Hip adduction    Hip internal rotation 4 4  Hip external rotation 4 4  Knee flexion 4 4  Knee extension 5 5  Ankle dorsiflexion 4 3  Ankle plantarflexion 5 3  Ankle inversion 5 3  Ankle eversion 5 3   (Blank rows = not tested)  LOWER EXTREMITY SPECIAL TESTS:   NT  FUNCTIONAL TESTS:   AROM: Ankle Dorsiflexion  GAIT: Distance walked: 50 ft. In clinic Assistive device utilized: None Level of assistance: Complete Independence Comments: Slight antalgic gait favoring weightbearing through LLE.  LEFS: 48  - L ankle AROM Plantarflexion 35 degrees - L ankle AROM Dorsiflexion 13 degrees                03/17/23:  L ankle AROM: DF 12 deg., PF 54 deg.  TREATMENT DATE: 04/09/2023  Subjective:  Pt. Reports 5/10 L ankle pain right now and reports doing a significant wts. Class last night (lots of squats with holds).  Pt. Entered PT with L ankle/foot swollen prior to tx.     Therapeutic Exercise:  Nustep L6 10 min. B LE only.  Discussed gym based ex./ weekend activities.  Walking in gym/ hallway with consistent heel strike/ toe off (several laps in gym/ hallway).  Increase L ankle swelling noted as compared to R.    Limited there.ex. today secondary to increase L ankle swelling/ pain.  Focus on manual tx./ icing  Discussed gym based ex.    Manual tx.:   Supine L ankle stretches (all planes), focus on gastroc/ DF stretches.  Increase L lateral ankle pain with IV/ DF stretches.    Grade II L ankle joint mobs. (AP/PA).  Calcaneous distraction/ stretches.  Ice to L ankle in supine after tx.    Not Today:  Tandem Airex gait (forward/ backwards/lateral)- in //-bars    Resisted gait in //-bars with 2BTB for 6x all 4-planes.  Slight increase in ankle discomfort.   There.act.:  R/L step ups on  6" step with Airex pad at stairs 20x with no UE assist.    BOSU lunges on L/R in //-bars with static holds 10x each.     Reverse BOSU wt. Shifting (forward/backwards/lateral) with no UE assist/ squats 20x.   Ball tossing with good L ankle stability.    BOSU step ups/downs on L and R LE in //-bars (10x each.).  Good ankle control without UE assist.      PATIENT EDUCATION:  Education details: Pain management, scar management, activity limitations Person educated: Patient Education method: Explanation Education comprehension: verbalized understanding  HOME EXERCISE PROGRAM:  Access Code: [email or web address] URL: [email or web address] Date: 02/02/2023 Prepared by: Dorene Grebe  Exercises - Long Sitting Ankle Dorsiflexion with Anchored Resistance  - 1 x daily - 7 x weekly - 3 sets - 10 reps - Seated Ankle Eversion with Resistance  - 1 x daily - 7 x weekly - 3 sets - 10 reps - Seated Ankle Plantar Flexion with Resistance Loop  - 1 x daily - 7 x weekly - 3 sets - 10 reps - Seated Ankle Inversion with Anchored Resistance  - 1 x daily - 7 x weekly - 3 sets - 10 reps - Sidelying Hip Abduction with Resistance at Ankle  - 1 x daily - 7 x weekly - 3 sets - 10 reps - Side Stepping with Resistance at Thighs and Ankles  - 1 x daily - 7 x weekly - 3 sets - 10 reps  ASSESSMENT:  CLINICAL IMPRESSION:  Today's treatment session focused on L ankle ROM/ pain mgmt.  Pts. L ankle pain increased with progressive stability ex./ end-range IV and EV stretches. Moderate L ankle swelling noted as compared to last tx. Session in the morning.  Pt. Worked hard throughout treatment session despite having several episodes of sharp L ankle pain.  Pt. Will benefit from skilled PT services to increase L ankle ROM/ strength to improve pain-free mobility/ return to work with no limitations.   OBJECTIVE IMPAIRMENTS: decreased mobility.   ACTIVITY LIMITATIONS: squatting  PARTICIPATION LIMITATIONS:  occupation  PERSONAL FACTORS: Past/current experiences are also affecting patient's functional outcome.   REHAB POTENTIAL: Good  CLINICAL DECISION MAKING: Stable/uncomplicated  EVALUATION COMPLEXITY: Low   GOALS: Goals reviewed with patient? Yes   LONG TERM GOALS: Target date: 04/14/2023  Patient will be  able to perform 10 bilateral forward lunges with NPS 0/10 to return to confidently performing functional ADLs/IADLs related to lifting/carrying objects. Baseline: NT Goal status: Partially met  2.  Patient will demonstrate 15 degrees of L ankle dorsiflexion to increase ground clearance during gait and promote normalized gait pattern.  Baseline: 3 degrees.  3/11: 12 deg. Goal status: Partially met  3.  Patient will increase proximal hip strength (ABD, ADD, IR/ER, EXT.) to 4/5 to increase global LE strength/stability. Baseline: 3/5 Goal status: INITIAL  4.  Patient will be able to jog in place for 2 minutes with no left ankle pain 0/10 NPS to promote return to gym based ex. Baseline:  Goal status: INITIAL  PLAN:  PT FREQUENCY: 2x/week  PT DURATION: 4 weeks  PLANNED INTERVENTIONS: 97110-Therapeutic exercises, 97530- Therapeutic activity, [id number]- Neuromuscular re-education, 97535- Self Care, [id number]- Manual therapy, and 97116- Gait training  PLAN FOR NEXT SESSION: CHECK GOAL/ MMT LE  Cammie Mcgee, PT, DPT # [phone] Physical Therapist - [facility]  04/09/2023, 8:36 PM

## 2023-04-14 ENCOUNTER — Ambulatory Visit: Admitting: Physical Therapy

## 2023-04-14 ENCOUNTER — Encounter: Payer: Self-pay | Admitting: Physical Therapy

## 2023-04-14 DIAGNOSIS — M6281 Muscle weakness (generalized): Secondary | ICD-10-CM

## 2023-04-14 DIAGNOSIS — R262 Difficulty in walking, not elsewhere classified: Secondary | ICD-10-CM

## 2023-04-14 DIAGNOSIS — R600 Localized edema: Secondary | ICD-10-CM

## 2023-04-14 DIAGNOSIS — M25672 Stiffness of left ankle, not elsewhere classified: Secondary | ICD-10-CM

## 2023-04-14 DIAGNOSIS — M25572 Pain in left ankle and joints of left foot: Secondary | ICD-10-CM | POA: Diagnosis not present

## 2023-04-14 NOTE — Therapy (Signed)
 OUTPATIENT PHYSICAL THERAPY LOWER EXTREMITY TREATMENT  Patient Name: Kimberly [name] MRN: [id number] DOB:Aug 02, 1970, 53 y.o., female  Today's Date: 04/14/2023  END OF SESSION:  PT End of Session - 04/14/23 0722     Visit Number 19    Number of Visits 21    Date for PT Re-Evaluation 04/14/23    PT Start Time 0734    PT Stop Time 0819    PT Time Calculation (min) 45 min            Past Medical History:  Diagnosis Date   Anorectal fistula    Anxiety    Arthritis    neck, hips   Chronic headaches    migraines   History of hypertension    since wt loss after gastric sleeve 2014--  no longer issue   Hx of adenomatous colonic polyps 03/28/2022   Hypertension    Irritable bowel syndrome    PONV (postoperative nausea and vomiting)    slow to wake up   Wears glasses    Past Surgical History:  Procedure Laterality Date   BREAST REDUCTION SURGERY  1996   CESAREAN SECTION  2001   COLONOSCOPY  10-18-2010   w/ bx's   ESOPHAGOGASTRODUODENOSCOPY N/A 06/29/2012   Procedure: ESOPHAGOGASTRODUODENOSCOPY (EGD);  Surgeon: Lodema Pilot, DO;  Location: [facility];  Service: General;  Laterality: N/A;   EVALUATION UNDER ANESTHESIA WITH ANAL FISTULECTOMY N/A 04/14/2014   Procedure: ANAL EXAM UNDER ANESTHESIA WITH  FISTULOTOMY;  Surgeon: Romie Levee, MD;  Location: [facility] [place];  Service: General;  Laterality: N/A;   LAPAROSCOPIC BILATERAL SALPINGECTOMY N/A 06/29/2012   Procedure: LAPAROSCOPIC BILATERAL SALPINGECTOMY;  Surgeon: Reva Bores, MD;  Location: [facility];  Service: Gynecology;  Laterality: N/A;   LAPAROSCOPIC GASTRIC SLEEVE RESECTION N/A 06/29/2012   Procedure: LAPAROSCOPIC GASTRIC SLEEVE RESECTION;  Surgeon: Lodema Pilot, DO;  Location: [facility];  Service: General;  Laterality: N/A;  Laparoscopic Sleeve Gastrectomy with EGD   REDUCTION MAMMAPLASTY Bilateral    VAGINAL HYSTERECTOMY N/A 01/13/2017   Procedure: HYSTERECTOMY VAGINAL;  Surgeon: Reva Bores, MD;  Location: [facility];   Service: Gynecology;  Laterality: N/A;   Patient Active Problem List   Diagnosis Date Noted   Attention or concentration deficit 03/04/2023   Hx of adenomatous colonic polyps 03/28/2022   Muscle spasm 11/13/2017   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   Migraine without aura 09/13/2010   ANA positive 09/13/2010   Polyarthralgia 09/13/2010   Essential hypertension 01/11/2009   DE QUERVAIN'S TENOSYNOVITIS 01/26/2008   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   REFERRING PROVIDER: Jannette Fogo, MD  REFERRING DIAG:   Posterior tibial tendinitis of left lower extremity  Tear of peroneal tendon, left, initial encounter  Impingement syndrome of left ankle  THERAPY DIAG:   Pain in left ankle and joints of left foot  Muscle weakness (generalized)  Ankle joint stiffness, left  Difficulty in walking, not elsewhere classified  Localized edema  Rationale for Evaluation and Treatment: Rehabilitation  ONSET DATE: Surgery Date 12/10/2022  SUBJECTIVE:   SUBJECTIVE STATEMENT:   Pt. Arrives to PT clinic FWB and prepared for evaluation session. Patient reports they are 7 weeks s/p L lateral ankle surgery following an injury during a Zumba class. Pt. Expressed having a history of bilateral ankle sprains with cheerleading and gymnastics participation in their past. Pt. Notes having PT in past with benefit but not liking dry-needling after past PT treatments. Pt. 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 Pt. Highlights that they would like to be able to get back to their normal routines and be able to trust their ankle to support them. Pt. Describes that they are excited and ready to begin physical therapy.  PERTINENT HISTORY:  Signs and Symptoms concordant with EDS. PAIN:   Are you having pain? Yes: NPRS scale: 3/10 Pain location: Left Ankle Pain description:  Dull/achy Aggravating factors: Long durations of weightbearing, AROM dorsiflexion/inversion Relieving factors: Ice and rest  PRECAUTIONS: None  RED FLAGS:  Signs and Sx's concordant with EDS variant    WEIGHT BEARING RESTRICTIONS:  No  FALLS:   Has patient fallen in last 6 months? 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  Patient demonstrates rounded shoulders and forward head posture.  Patient demonstrates anterior pelvic tilt/ MCI of pelvic stabilization musculature.  Pt. Surgical sites are clean, sterile, and healing well.  Pt. Demonstrates decreased strength in proximal hip musculature  Pt. Demonstrates P with all AROM for L ankle. NPS 4/10  Note: Objective measures were completed at Evaluation unless otherwise noted.  DIAGNOSTIC FINDINGS:   L Lateral ankle ROM/Strength Deficit  PATIENT SURVEYS:    LEFS: 27/80  COGNITION:  Overall cognitive status: Within functional limits for tasks assessed    SENSATION:  WFL  EDEMA:  Figure 8: R 53 cm/ L 60 cm  MUSCLE LENGTH:  NT  POSTURE: rounded shoulders  PALPATION:  Residual edema present in L lateral ankle.  Discussed scar massage/ incision healing.    LOWER EXTREMITY ROM:  Active ROM Right eval Left eval  Hip flexion    Hip extension    Hip abduction    Hip adduction    Hip internal rotation    Hip external rotation    Knee flexion    Knee extension    Ankle dorsiflexion 20 deg. 3 deg.  Ankle plantarflexion 50 deg. 40 deg.  Ankle inversion 30 deg. 18 deg.   Ankle eversion 20 deg. 8  deg.   (Blank rows = not tested)  LOWER EXTREMITY MMT:  MMT Right eval Left eval  Hip flexion 3 3  Hip extension 4 4  Hip abduction 3 3  Hip adduction    Hip internal rotation 4 4  Hip external rotation 4 4  Knee flexion 4 4  Knee extension 5 5  Ankle dorsiflexion 4 3  Ankle plantarflexion 5 3  Ankle inversion 5 3  Ankle eversion 5 3   (Blank rows = not tested)  LOWER EXTREMITY SPECIAL TESTS:   NT  FUNCTIONAL TESTS:   AROM: Ankle Dorsiflexion  GAIT: Distance walked: 50 ft. In clinic Assistive device utilized: None Level of assistance: Complete Independence Comments: Slight antalgic gait favoring weightbearing through LLE.  LEFS: 48  - L ankle AROM Plantarflexion 35 degrees - L ankle AROM Dorsiflexion 13 degrees                03/17/23:  L ankle AROM: DF 12 deg., PF 54 deg.  TREATMENT DATE: 04/14/2023  Subjective:  Pt. Reports 2/10 L ankle pain right now but L ankle was "really achy".  Pt. Brought in ankle sleeve that she uses to manage swelling with working out.    Therapeutic Exercise:  Nustep L6 10 min. B LE only.  Discussed gym based ex./   Walking in gym/ hallway with consistent heel strike/ toe off (several laps in gym/ hallway).  Increase L ankle swelling noted as compared to R.    Limited there.ex. today secondary to increase L ankle swelling/ pain.  Focus on manual tx./ icing  Discussed gym based ex.    There.act.:  BOSU lunges on L/R in //-bars with static holds 10x each.     Reverse BOSU squats (great mechanics)- 20x.      BOSU step ups/downs on L and R LE in //-bars (10x each.).  Good ankle control without UE assist.  Forward/lateral.  Attempted SLS on BOSU with L but pain limited without UE assist.    Manual tx.:   Supine L ankle stretches (all planes), focus on gastroc/ DF stretches.  Increase L lateral ankle pain with IV/ DF stretches.    Grade II L ankle joint mobs. (AP/PA).  Calcaneous  distraction/ stretches.  Ice to L ankle in supine after tx.       PATIENT EDUCATION:  Education details: Pain management, scar management, activity limitations Person educated: Patient Education method: Explanation Education comprehension: verbalized understanding  HOME EXERCISE PROGRAM:  Access Code: [email or web address] URL: [email or web address] Date: 02/02/2023 Prepared by: Dorene Grebe  Exercises - Long Sitting Ankle Dorsiflexion with Anchored Resistance  - 1 x daily - 7 x weekly - 3 sets - 10 reps - Seated Ankle Eversion with Resistance  - 1 x daily - 7 x weekly - 3 sets - 10 reps - Seated Ankle Plantar Flexion with Resistance Loop  - 1 x daily - 7 x weekly - 3 sets - 10 reps - Seated Ankle Inversion with Anchored Resistance  - 1 x daily - 7 x weekly - 3 sets - 10 reps - Sidelying Hip Abduction with Resistance at Ankle  - 1 x daily - 7 x weekly - 3 sets - 10 reps - Side Stepping with Resistance at Thighs and Ankles  - 1 x daily - 7 x weekly - 3 sets - 10 reps  ASSESSMENT:  CLINICAL IMPRESSION:  Today's treatment session focused on L ankle ROM/ pain mgmt.  Pts. L ankle pain increased with progressive stability ex./ end-range IV and EV stretches. Moderate L ankle swelling noted as compared to last tx. Session in the morning.  Pt. Worked hard throughout treatment session despite having several episodes of sharp L ankle pain.  Pt. Will benefit from skilled PT services to increase L ankle ROM/ strength to improve pain-free mobility/ return to work with no limitations.   OBJECTIVE IMPAIRMENTS: decreased mobility.   ACTIVITY LIMITATIONS: squatting  PARTICIPATION LIMITATIONS: occupation  PERSONAL FACTORS: Past/current experiences are also affecting patient's functional outcome.   REHAB POTENTIAL: Good  CLINICAL DECISION MAKING: Stable/uncomplicated  EVALUATION COMPLEXITY: Low   GOALS: Goals reviewed with patient? Yes   LONG TERM GOALS: Target date:  04/14/2023  Patient will be able to perform 10 bilateral forward lunges with NPS 0/10 to return to confidently performing functional ADLs/IADLs related to lifting/carrying objects. Baseline: NT Goal status: Partially met  2.  Patient will demonstrate 15 degrees of L ankle dorsiflexion to increase ground clearance during gait and promote normalized gait pattern.  Baseline:  3 degrees.  3/11: 12 deg. Goal status: Partially met  3.  Patient will increase proximal hip strength (ABD, ADD, IR/ER, EXT.) to 4/5 to increase global LE strength/stability. Baseline: 3/5 Goal status: INITIAL  4.  Patient will be able to jog in place for 2 minutes with no left ankle pain 0/10 NPS to promote return to gym based ex. Baseline:  Goal status: INITIAL  PLAN:  PT FREQUENCY: 2x/week  PT DURATION: 4 weeks  PLANNED INTERVENTIONS: 97110-Therapeutic exercises, 97530- Therapeutic activity, [id number]- Neuromuscular re-education, 97535- Self Care, 95621- Manual therapy, and 97116- Gait training  PLAN FOR NEXT SESSION: CHECK GOAL/ MMT LE  Cammie Mcgee, PT, DPT # [phone] Physical Therapist - [facility]  04/14/2023, 10:05 AM

## 2023-04-21 ENCOUNTER — Ambulatory Visit: Admitting: Physical Therapy

## 2023-04-21 DIAGNOSIS — R262 Difficulty in walking, not elsewhere classified: Secondary | ICD-10-CM

## 2023-04-21 DIAGNOSIS — M6281 Muscle weakness (generalized): Secondary | ICD-10-CM

## 2023-04-21 DIAGNOSIS — M25572 Pain in left ankle and joints of left foot: Secondary | ICD-10-CM

## 2023-04-21 DIAGNOSIS — M25672 Stiffness of left ankle, not elsewhere classified: Secondary | ICD-10-CM

## 2023-04-21 DIAGNOSIS — R600 Localized edema: Secondary | ICD-10-CM

## 2023-04-21 NOTE — Therapy (Signed)
 OUTPATIENT PHYSICAL THERAPY LOWER EXTREMITY TREATMENT/ RECERTIFICATION  Patient Name: Kimberly [name] MRN: [id number] DOB:1970-03-24, 53 y.o., female  Today's Date: 04/21/2023  END OF SESSION:  PT End of Session - 04/21/23 0734     Visit Number 20    Number of Visits 26    Date for PT Re-Evaluation 06/02/23    PT Start Time 0734    PT Stop Time 0818    PT Time Calculation (min) 44 min            Past Medical History:  Diagnosis Date   Anorectal fistula    Anxiety    Arthritis    neck, hips   Chronic headaches    migraines   History of hypertension    since wt loss after gastric sleeve 2014--  no longer issue   Hx of adenomatous colonic polyps 03/28/2022   Hypertension    Irritable bowel syndrome    PONV (postoperative nausea and vomiting)    slow to wake up   Wears glasses    Past Surgical History:  Procedure Laterality Date   BREAST REDUCTION SURGERY  1996   CESAREAN SECTION  2001   COLONOSCOPY  10-18-2010   w/ bx's   ESOPHAGOGASTRODUODENOSCOPY N/A 06/29/2012   Procedure: ESOPHAGOGASTRODUODENOSCOPY (EGD);  Surgeon: Evander Hills, DO;  Location: [facility];  Service: General;  Laterality: N/A;   EVALUATION UNDER ANESTHESIA WITH ANAL FISTULECTOMY N/A 04/14/2014   Procedure: ANAL EXAM UNDER ANESTHESIA WITH  FISTULOTOMY;  Surgeon: Joyce Nixon, MD;  Location: [facility] [place];  Service: General;  Laterality: N/A;   LAPAROSCOPIC BILATERAL SALPINGECTOMY N/A 06/29/2012   Procedure: LAPAROSCOPIC BILATERAL SALPINGECTOMY;  Surgeon: Granville Layer, MD;  Location: [facility];  Service: Gynecology;  Laterality: N/A;   LAPAROSCOPIC GASTRIC SLEEVE RESECTION N/A 06/29/2012   Procedure: LAPAROSCOPIC GASTRIC SLEEVE RESECTION;  Surgeon: Evander Hills, DO;  Location: [facility];  Service: General;  Laterality: N/A;  Laparoscopic Sleeve Gastrectomy with EGD   REDUCTION MAMMAPLASTY Bilateral    VAGINAL HYSTERECTOMY N/A 01/13/2017   Procedure: HYSTERECTOMY VAGINAL;  Surgeon: Granville Layer, MD;   Location: [facility];  Service: Gynecology;  Laterality: N/A;   Patient Active Problem List   Diagnosis Date Noted   Attention or concentration deficit 03/04/2023   Hx of adenomatous colonic polyps 03/28/2022   Muscle spasm 11/13/2017   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   Migraine without aura 09/13/2010   ANA positive 09/13/2010   Polyarthralgia 09/13/2010   Essential hypertension 01/11/2009   DE QUERVAIN'S TENOSYNOVITIS 01/26/2008   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   REFERRING PROVIDER: Fawn Hooks, MD  REFERRING DIAG:   Posterior tibial tendinitis of left lower extremity  Tear of peroneal tendon, left, initial encounter  Impingement syndrome of left ankle  THERAPY DIAG:   Pain in left ankle and joints of left foot  Muscle weakness (generalized)  Ankle joint stiffness, left  Difficulty in walking, not elsewhere classified  Localized edema  Rationale for Evaluation and Treatment: Rehabilitation  ONSET DATE: Surgery Date 12/10/2022  SUBJECTIVE:   SUBJECTIVE STATEMENT:   Pt. Arrives to PT clinic FWB and prepared for evaluation session. Patient reports they are 7 weeks s/p L lateral ankle surgery following an injury during a Zumba class. Pt. Expressed having a history of bilateral ankle sprains with cheerleading and gymnastics participation in their past. Pt. Notes having PT in past with benefit but not liking dry-needling after past PT treatments. 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 Pt. Highlights that they would like to be able to get back to their normal routines and be able to trust their ankle to support them. Pt. Describes that they are excited and ready to begin physical therapy.  PERTINENT HISTORY:  Signs and Symptoms concordant with EDS. PAIN:   Are you having pain? Yes: NPRS scale: 3/10 Pain location: Left  Ankle Pain description: Dull/achy Aggravating factors: Long durations of weightbearing, AROM dorsiflexion/inversion Relieving factors: Ice and rest  PRECAUTIONS: None  RED FLAGS:  Signs and Sx's concordant with EDS variant    WEIGHT BEARING RESTRICTIONS:  No  FALLS:   Has patient fallen in last 6 months? 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  Patient demonstrates rounded shoulders and forward head posture.  Patient demonstrates anterior pelvic tilt/ MCI of pelvic stabilization musculature.  Pt. Surgical sites are clean, sterile, and healing well.  Pt. Demonstrates decreased strength in proximal hip musculature  Pt. Demonstrates P with all AROM for L ankle. NPS 4/10  Note: Objective measures were completed at Evaluation unless otherwise noted.  DIAGNOSTIC FINDINGS:   L Lateral ankle ROM/Strength Deficit  PATIENT SURVEYS:    LEFS: 27/80  COGNITION:  Overall cognitive status: Within functional limits for tasks assessed    SENSATION:  WFL  EDEMA:  Figure 8: R 53 cm/ L 60 cm  MUSCLE LENGTH:  NT  POSTURE: rounded shoulders  PALPATION:  Residual edema present in L lateral ankle.  Discussed scar massage/ incision healing.    LOWER EXTREMITY ROM:  Active ROM Right eval Left eval  Hip flexion    Hip extension    Hip abduction    Hip adduction    Hip internal rotation    Hip external rotation    Knee flexion    Knee extension    Ankle dorsiflexion 20 deg. 3 deg.  Ankle plantarflexion 50 deg. 40 deg.  Ankle inversion 30 deg. 18 deg.    Ankle eversion 20 deg. 8 deg.   (Blank rows = not tested)  LOWER EXTREMITY MMT:  MMT Right eval Left eval  Hip flexion 3 3  Hip extension 4 4  Hip abduction 3 3  Hip adduction    Hip internal rotation 4 4  Hip external rotation 4 4  Knee flexion 4 4  Knee extension 5 5  Ankle dorsiflexion 4 3  Ankle plantarflexion 5 3  Ankle inversion 5 3  Ankle eversion 5 3   (Blank rows = not tested)  LOWER EXTREMITY SPECIAL TESTS:   NT  FUNCTIONAL TESTS:   AROM: Ankle Dorsiflexion  GAIT: Distance walked: 50 ft. In clinic Assistive device utilized: None Level of assistance: Complete Independence Comments: Slight antalgic gait favoring weightbearing through LLE.  LEFS: 48  - L ankle AROM Plantarflexion 35 degrees - L ankle AROM Dorsiflexion 13 degrees                03/17/23:  L ankle AROM: DF 12 deg., PF 54 deg.  TREATMENT DATE: 04/21/2023  Subjective:  Pt. Had a nice trip to [place] last week.  Pt. States her L ankle swelling increased after 3 hours of walking around.  Pt. Reports no L ankle pain right now.  Pt. Did a dance class last Sunday and no L ankle issues.    Therapeutic Exercise:  Nustep L6 10 min. B LE only.  Discussed dance class/ trip to [place].  Walking in gym/ hallway with consistent heel strike/ toe off (several laps in gym/ hallway).  More normalized gait pattern.    Standing heel/ toe raises (L lateral/medial ankle discomfort)- heel and toe walking in //-bars.      BOSU lunges on L/R in //-bars with static holds 10x each.     Reverse BOSU squats (great mechanics)- 20x.  Attempted SLS on BOSU with L but pain limited without UE assist.     BOSU step ups/downs on L and R LE in //-bars (10x each.)- forward/lateral.  Good ankle control without UE assist.    Discussed gym based ex.   Manual tx.:   Supine L ankle stretches (all planes), focus on gastroc/ DF stretches.  Increase L lateral ankle pain with IV/ DF stretches.     L ankle AROM: DF 12 deg., PF 56 deg., IV 30 deg., EV 22 deg.    Grade II L ankle joint mobs. (AP/PA).  Calcaneous distraction/ stretches.   PATIENT EDUCATION:  Education details: Pain management, scar management, activity limitations Person educated: Patient Education method: Explanation Education comprehension: verbalized understanding  HOME EXERCISE PROGRAM:  Access Code: [email or web address] URL: [email or web address] Date: 02/02/2023 Prepared by: Hazeline Lister  Exercises - Long Sitting Ankle Dorsiflexion with Anchored Resistance  - 1 x daily - 7 x weekly - 3 sets - 10 reps - Seated Ankle Eversion with Resistance  - 1 x daily - 7 x weekly - 3 sets - 10 reps - Seated Ankle Plantar Flexion with Resistance Loop  - 1 x daily - 7 x weekly - 3 sets - 10 reps - Seated Ankle Inversion with Anchored Resistance  - 1 x daily - 7 x weekly - 3 sets - 10 reps - Sidelying Hip Abduction with Resistance at Ankle  - 1 x daily - 7 x weekly - 3 sets - 10 reps - Side Stepping with Resistance at Thighs and Ankles  - 1 x daily - 7 x weekly - 3 sets - 10 reps  ASSESSMENT:  CLINICAL IMPRESSION:  Today's treatment session focused on L ankle ROM/ stability and pain mgmt.  Pts. L ankle pain increased with progressive stability ex./ end-range IV and EV stretches. Minimal L ankle swelling noted as compared to R ankle.  Pt. Worked hard throughout treatment session with only a couple episodes of ankle pain with end-range EV/IV stretches and higher level stability ex.  Pt. Will benefit from skilled PT services to increase L ankle ROM/ strength to improve pain-free mobility/ return to work with no limitations.   OBJECTIVE IMPAIRMENTS: decreased mobility.   ACTIVITY LIMITATIONS: squatting  PARTICIPATION LIMITATIONS: occupation  PERSONAL FACTORS: Past/current experiences are also affecting patient's functional outcome.   REHAB POTENTIAL: Good  CLINICAL DECISION MAKING:  Stable/uncomplicated  EVALUATION COMPLEXITY: Low   GOALS: Goals reviewed with patient? Yes   LONG TERM GOALS: Target date: 06/02/2023  Patient will be able to perform 10 bilateral forward lunges with NPS 0/10 to return to confidently performing functional ADLs/IADLs related to lifting/carrying objects. Baseline: NT Goal status: Partially met  2.  Patient will  demonstrate 15 degrees of L ankle dorsiflexion to increase ground clearance during gait and promote normalized gait pattern.  Baseline: 3 degrees.  3/11: 12 deg. Goal status: Partially met  3.  Patient will increase proximal hip strength (ABD, ADD, IR/ER, EXT.) to 4/5 to increase global LE strength/stability. Baseline: 3/5 Goal status: Partially met  4.  Patient will be able to jog in place for 2 minutes with no left ankle pain 0/10 NPS to promote return to gym based ex. Baseline:  Goal status: Ongoing  PLAN:  PT FREQUENCY: 2x/week  PT DURATION: 4 weeks  PLANNED INTERVENTIONS: 97110-Therapeutic exercises, 97530- Therapeutic activity, [id number]- Neuromuscular re-education, 97535- Self Care, 54098- Manual therapy, and 97116- Gait training  PLAN FOR NEXT SESSION:  Stability ex.  Lendell Quarry, PT, DPT # [phone] Physical Therapist - [facility]  04/21/2023, 6:23 PM

## 2023-04-23 ENCOUNTER — Ambulatory Visit: Admitting: Physical Therapy

## 2023-04-23 ENCOUNTER — Encounter: Payer: Self-pay | Admitting: Physical Therapy

## 2023-04-23 DIAGNOSIS — R600 Localized edema: Secondary | ICD-10-CM

## 2023-04-23 DIAGNOSIS — M6281 Muscle weakness (generalized): Secondary | ICD-10-CM

## 2023-04-23 DIAGNOSIS — M25672 Stiffness of left ankle, not elsewhere classified: Secondary | ICD-10-CM

## 2023-04-23 DIAGNOSIS — M25572 Pain in left ankle and joints of left foot: Secondary | ICD-10-CM

## 2023-04-23 DIAGNOSIS — R262 Difficulty in walking, not elsewhere classified: Secondary | ICD-10-CM

## 2023-04-23 NOTE — Therapy (Signed)
 OUTPATIENT PHYSICAL THERAPY LOWER EXTREMITY TREATMENT  Patient Name: Kimberly [name] MRN: [id number] DOB:1970/05/21, 53 y.o., female  Today's Date: 04/24/2023  END OF SESSION:  PT End of Session - 04/23/23 1523     Visit Number 21    Number of Visits 26    Date for PT Re-Evaluation 06/02/23    PT Start Time 1523    PT Stop Time 1606    PT Time Calculation (min) 43 min            Past Medical History:  Diagnosis Date   Anorectal fistula    Anxiety    Arthritis    neck, hips   Chronic headaches    migraines   History of hypertension    since wt loss after gastric sleeve 2014--  no longer issue   Hx of adenomatous colonic polyps 03/28/2022   Hypertension    Irritable bowel syndrome    PONV (postoperative nausea and vomiting)    slow to wake up   Wears glasses    Past Surgical History:  Procedure Laterality Date   BREAST REDUCTION SURGERY  1996   CESAREAN SECTION  2001   COLONOSCOPY  10-18-2010   w/ bx's   ESOPHAGOGASTRODUODENOSCOPY N/A 06/29/2012   Procedure: ESOPHAGOGASTRODUODENOSCOPY (EGD);  Surgeon: Evander Hills, DO;  Location: [facility];  Service: General;  Laterality: N/A;   EVALUATION UNDER ANESTHESIA WITH ANAL FISTULECTOMY N/A 04/14/2014   Procedure: ANAL EXAM UNDER ANESTHESIA WITH  FISTULOTOMY;  Surgeon: Joyce Nixon, MD;  Location: [facility];  Service: General;  Laterality: N/A;   LAPAROSCOPIC BILATERAL SALPINGECTOMY N/A 06/29/2012   Procedure: LAPAROSCOPIC BILATERAL SALPINGECTOMY;  Surgeon: Granville Layer, MD;  Location: [facility];  Service: Gynecology;  Laterality: N/A;   LAPAROSCOPIC GASTRIC SLEEVE RESECTION N/A 06/29/2012   Procedure: LAPAROSCOPIC GASTRIC SLEEVE RESECTION;  Surgeon: Evander Hills, DO;  Location: [facility];  Service: General;  Laterality: N/A;  Laparoscopic Sleeve Gastrectomy with EGD   REDUCTION MAMMAPLASTY Bilateral    VAGINAL HYSTERECTOMY N/A 01/13/2017   Procedure: HYSTERECTOMY VAGINAL;  Surgeon: Granville Layer, MD;  Location: [facility];   Service: Gynecology;  Laterality: N/A;   Patient Active Problem List   Diagnosis Date Noted   Attention or concentration deficit 03/04/2023   Hx of adenomatous colonic polyps 03/28/2022   Muscle spasm 11/13/2017   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   Migraine without aura 09/13/2010   ANA positive 09/13/2010   Polyarthralgia 09/13/2010   Essential hypertension 01/11/2009   DE QUERVAIN'S TENOSYNOVITIS 01/26/2008   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   REFERRING PROVIDER: Fawn Hooks, MD  REFERRING DIAG:   Posterior tibial tendinitis of left lower extremity  Tear of peroneal tendon, left, initial encounter  Impingement syndrome of left ankle  THERAPY DIAG:   Pain in left ankle and joints of left foot  Muscle weakness (generalized)  Ankle joint stiffness, left  Difficulty in walking, not elsewhere classified  Localized edema  Rationale for Evaluation and Treatment: Rehabilitation  ONSET DATE: Surgery Date 12/10/2022  SUBJECTIVE:   SUBJECTIVE STATEMENT:   Pt. Arrives to PT clinic FWB and prepared for evaluation session. Patient reports they are 7 weeks s/p L lateral ankle surgery following an injury during a Zumba class. Pt. Expressed having a history of bilateral ankle sprains with cheerleading and gymnastics participation in their past. Pt. Notes having PT in past with benefit but not liking dry-needling after past PT treatments. Pt. 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 Pt. Highlights that they would like to be able to get back to their normal routines and be able to trust their ankle to support them. Pt. Describes that they are excited and ready to begin physical therapy.  PERTINENT HISTORY:  Signs and Symptoms concordant with EDS. PAIN:   Are you having pain? Yes: NPRS scale: 3/10 Pain location: Left Ankle Pain description:  Dull/achy Aggravating factors: Long durations of weightbearing, AROM dorsiflexion/inversion Relieving factors: Ice and rest  PRECAUTIONS: None  RED FLAGS:  Signs and Sx's concordant with EDS variant    WEIGHT BEARING RESTRICTIONS:  No  FALLS:   Has patient fallen in last 6 months? 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  Patient demonstrates rounded shoulders and forward head posture.  Patient demonstrates anterior pelvic tilt/ MCI of pelvic stabilization musculature.  Pt. Surgical sites are clean, sterile, and healing well.  Pt. Demonstrates decreased strength in proximal hip musculature  Pt. Demonstrates P with all AROM for L ankle. NPS 4/10  Note: Objective measures were completed at Evaluation unless otherwise noted.  DIAGNOSTIC FINDINGS:   L Lateral ankle ROM/Strength Deficit  PATIENT SURVEYS:    LEFS: 27/80  COGNITION:  Overall cognitive status: Within functional limits for tasks assessed    SENSATION:  WFL  EDEMA:  Figure 8: R 53 cm/ L 60 cm  MUSCLE LENGTH:  NT  POSTURE: rounded shoulders  PALPATION:  Residual edema present in L lateral ankle.  Discussed scar massage/ incision healing.    LOWER EXTREMITY ROM:  Active ROM Right eval Left eval  Hip flexion    Hip extension    Hip abduction    Hip adduction    Hip internal rotation    Hip external rotation    Knee flexion    Knee extension    Ankle dorsiflexion 20 deg. 3 deg.  Ankle plantarflexion 50 deg. 40 deg.  Ankle inversion 30 deg. 18 deg.   Ankle eversion 20 deg. 8  deg.   (Blank rows = not tested)  LOWER EXTREMITY MMT:  MMT Right eval Left eval  Hip flexion 3 3  Hip extension 4 4  Hip abduction 3 3  Hip adduction    Hip internal rotation 4 4  Hip external rotation 4 4  Knee flexion 4 4  Knee extension 5 5  Ankle dorsiflexion 4 3  Ankle plantarflexion 5 3  Ankle inversion 5 3  Ankle eversion 5 3   (Blank rows = not tested)  LOWER EXTREMITY SPECIAL TESTS:   NT  FUNCTIONAL TESTS:   AROM: Ankle Dorsiflexion  GAIT: Distance walked: 50 ft. In clinic Assistive device utilized: None Level of assistance: Complete Independence Comments: Slight antalgic gait favoring weightbearing through LLE.  LEFS: 48  - L ankle AROM Plantarflexion 35 degrees - L ankle AROM Dorsiflexion 13 degrees                03/17/23:  L ankle AROM: DF 12 deg., PF 54 deg.  TREATMENT DATE: 04/24/2023  Subjective:  Pt. Had a nice trip to [place] and presents with moderate L ankle swelling.    Therapeutic Exercise:  TG squats/ unilateral 15x2.  Heel raises 20x.    Blaze Pods at star (SLS touches)- 1 min. X 2.  Challenged with L SLR.  Nautilus 80# 5x L/R lateral walking/ forward lunges 5x (1 episode of sharp ankle pain).    Walking in gym with consistent heel strike/ toe off (several laps in gym/ hallway).  More normalized gait pattern.    BOSU step ups/downs on L and R LE in //-bars (10x each.)- forward/lateral.  Good ankle control without UE assist.    Nustep L6 at seat 5 for 10 min. B LE only.    Discussed gym based ex.   Manual tx.:   Supine L ankle stretches (all planes), focus on gastroc/ DF stretches.  Increase L lateral ankle pain with IV/ DF stretches.    Grade II L ankle joint mobs. (AP/PA).  Calcaneous distraction/ stretches.   PATIENT EDUCATION:  Education details: Pain management, scar management, activity limitations Person educated: Patient Education method: Explanation Education  comprehension: verbalized understanding  HOME EXERCISE PROGRAM:  Access Code: [email or web address] URL: [email or web address] Date: 02/02/2023 Prepared by: Hazeline Lister  Exercises - Long Sitting Ankle Dorsiflexion with Anchored Resistance  - 1 x daily - 7 x weekly - 3 sets - 10 reps - Seated Ankle Eversion with Resistance  - 1 x daily - 7 x weekly - 3 sets - 10 reps - Seated Ankle Plantar Flexion with Resistance Loop  - 1 x daily - 7 x weekly - 3 sets - 10 reps - Seated Ankle Inversion with Anchored Resistance  - 1 x daily - 7 x weekly - 3 sets - 10 reps - Sidelying Hip Abduction with Resistance at Ankle  - 1 x daily - 7 x weekly - 3 sets - 10 reps - Side Stepping with Resistance at Thighs and Ankles  - 1 x daily - 7 x weekly - 3 sets - 10 reps  ASSESSMENT:  CLINICAL IMPRESSION:  Today's treatment session focused on L ankle ROM/ stability and pain mgmt.  Pts. L ankle pain increased with progressive stability ex./ end-range IV and EV stretches. Moderate L ankle swelling noted as compared to R ankle.  Pt. Worked hard throughout treatment session with a couple episodes of ankle pain with end-range EV/IV stretches and higher level stability ex.  PT iced pts. Ankle after tx. And recommended icing again at home this evening.  Pt. Will benefit from skilled PT services to increase L ankle ROM/ strength to improve pain-free mobility/ return to work with no limitations.   OBJECTIVE IMPAIRMENTS: decreased mobility.   ACTIVITY LIMITATIONS: squatting  PARTICIPATION LIMITATIONS: occupation  PERSONAL FACTORS: Past/current experiences are also affecting patient's functional outcome.   REHAB POTENTIAL: Good  CLINICAL DECISION MAKING: Stable/uncomplicated  EVALUATION COMPLEXITY: Low   GOALS: Goals reviewed with patient? Yes   LONG TERM GOALS: Target date: 06/02/2023  Patient will be able to perform 10 bilateral forward lunges with NPS 0/10 to return to confidently performing functional  ADLs/IADLs related to lifting/carrying objects. Baseline: NT Goal status: Partially met  2.  Patient will demonstrate 15 degrees of L ankle dorsiflexion to increase ground clearance during gait and promote normalized gait pattern.  Baseline: 3 degrees.  3/11: 12 deg. Goal status: Partially met  3.  Patient will increase proximal hip strength (ABD, ADD, IR/ER, EXT.) to 4/5 to increase global LE  strength/stability. Baseline: 3/5 Goal status: Partially met  4.  Patient will be able to jog in place for 2 minutes with no left ankle pain 0/10 NPS to promote return to gym based ex. Baseline:  Goal status: Ongoing  PLAN:  PT FREQUENCY: 2x/week  PT DURATION: 4 weeks  PLANNED INTERVENTIONS: 97110-Therapeutic exercises, 97530- Therapeutic activity, [id number]- Neuromuscular re-education, 97535- Self Care, 11914- Manual therapy, and 97116- Gait training  PLAN FOR NEXT SESSION:  Stability ex.  Lendell Quarry, PT, DPT # [phone] Physical Therapist - [facility]  04/24/2023, 4:37 PM

## 2023-04-30 ENCOUNTER — Encounter: Payer: Self-pay | Admitting: Physical Therapy

## 2023-04-30 ENCOUNTER — Ambulatory Visit: Admitting: Physical Therapy

## 2023-04-30 DIAGNOSIS — M6281 Muscle weakness (generalized): Secondary | ICD-10-CM

## 2023-04-30 DIAGNOSIS — R262 Difficulty in walking, not elsewhere classified: Secondary | ICD-10-CM

## 2023-04-30 DIAGNOSIS — R600 Localized edema: Secondary | ICD-10-CM

## 2023-04-30 DIAGNOSIS — M25672 Stiffness of left ankle, not elsewhere classified: Secondary | ICD-10-CM

## 2023-04-30 DIAGNOSIS — M25572 Pain in left ankle and joints of left foot: Secondary | ICD-10-CM

## 2023-04-30 NOTE — Therapy (Signed)
 OUTPATIENT PHYSICAL THERAPY LOWER EXTREMITY TREATMENT  Patient Name: Kimberly [name] MRN: [id number] DOB:09-11-1970, 53 y.o., female  Today's Date: 04/30/2023  END OF SESSION:  PT End of Session - 04/30/23 1509     Visit Number 22    Number of Visits 26    Date for PT Re-Evaluation 06/02/23    PT Start Time 1509    PT Stop Time 1600    PT Time Calculation (min) 51 min            Past Medical History:  Diagnosis Date   Anorectal fistula    Anxiety    Arthritis    neck, hips   Chronic headaches    migraines   History of hypertension    since wt loss after gastric sleeve 2014--  no longer issue   Hx of adenomatous colonic polyps 03/28/2022   Hypertension    Irritable bowel syndrome    PONV (postoperative nausea and vomiting)    slow to wake up   Wears glasses    Past Surgical History:  Procedure Laterality Date   BREAST REDUCTION SURGERY  1996   CESAREAN SECTION  2001   COLONOSCOPY  10-18-2010   w/ bx's   ESOPHAGOGASTRODUODENOSCOPY N/A 06/29/2012   Procedure: ESOPHAGOGASTRODUODENOSCOPY (EGD);  Surgeon: Evander Hills, DO;  Location: [facility];  Service: General;  Laterality: N/A;   EVALUATION UNDER ANESTHESIA WITH ANAL FISTULECTOMY N/A 04/14/2014   Procedure: ANAL EXAM UNDER ANESTHESIA WITH  FISTULOTOMY;  Surgeon: Joyce Nixon, MD;  Location: [facility];  Service: General;  Laterality: N/A;   LAPAROSCOPIC BILATERAL SALPINGECTOMY N/A 06/29/2012   Procedure: LAPAROSCOPIC BILATERAL SALPINGECTOMY;  Surgeon: Granville Layer, MD;  Location: [facility];  Service: Gynecology;  Laterality: N/A;   LAPAROSCOPIC GASTRIC SLEEVE RESECTION N/A 06/29/2012   Procedure: LAPAROSCOPIC GASTRIC SLEEVE RESECTION;  Surgeon: Evander Hills, DO;  Location: [facility];  Service: General;  Laterality: N/A;  Laparoscopic Sleeve Gastrectomy with EGD   REDUCTION MAMMAPLASTY Bilateral    VAGINAL HYSTERECTOMY N/A 01/13/2017   Procedure: HYSTERECTOMY VAGINAL;  Surgeon: Granville Layer, MD;  Location: [facility];   Service: Gynecology;  Laterality: N/A;   Patient Active Problem List   Diagnosis Date Noted   Attention or concentration deficit 03/04/2023   Hx of adenomatous colonic polyps 03/28/2022   Muscle spasm 11/13/2017   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   Migraine without aura 09/13/2010   ANA positive 09/13/2010   Polyarthralgia 09/13/2010   Essential hypertension 01/11/2009   DE QUERVAIN'S TENOSYNOVITIS 01/26/2008   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   REFERRING PROVIDER: Fawn Hooks, MD  REFERRING DIAG:   Posterior tibial tendinitis of left lower extremity  Tear of peroneal tendon, left, initial encounter  Impingement syndrome of left ankle  THERAPY DIAG:   Pain in left ankle and joints of left foot  Muscle weakness (generalized)  Ankle joint stiffness, left  Difficulty in walking, not elsewhere classified  Localized edema  Rationale for Evaluation and Treatment: Rehabilitation  ONSET DATE: Surgery Date 12/10/2022  SUBJECTIVE:   SUBJECTIVE STATEMENT:   Pt. Arrives to PT clinic FWB and prepared for evaluation session. Patient reports they are 7 weeks s/p L lateral ankle surgery following an injury during a Zumba class. Pt. Expressed having a history of bilateral ankle sprains with cheerleading and gymnastics participation in their past. Pt. Notes having PT in past with benefit but not liking dry-needling after past PT treatments. Pt. 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 Pt. Highlights that they would like to be able to get back to their normal routines and be able to trust their ankle to support them. Pt. Describes that they are excited and ready to begin physical therapy.  PERTINENT HISTORY:  Signs and Symptoms concordant with EDS. PAIN:   Are you having pain? Yes: NPRS scale: 3/10 Pain location: Left Ankle Pain description:  Dull/achy Aggravating factors: Long durations of weightbearing, AROM dorsiflexion/inversion Relieving factors: Ice and rest  PRECAUTIONS: None  RED FLAGS:  Signs and Sx's concordant with EDS variant    WEIGHT BEARING RESTRICTIONS:  No  FALLS:   Has patient fallen in last 6 months? 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  Patient demonstrates rounded shoulders and forward head posture.  Patient demonstrates anterior pelvic tilt/ MCI of pelvic stabilization musculature.  Pt. Surgical sites are clean, sterile, and healing well.  Pt. Demonstrates decreased strength in proximal hip musculature  Pt. Demonstrates P with all AROM for L ankle. NPS 4/10  Note: Objective measures were completed at Evaluation unless otherwise noted.  DIAGNOSTIC FINDINGS:   L Lateral ankle ROM/Strength Deficit  PATIENT SURVEYS:    LEFS: 27/80  COGNITION:  Overall cognitive status: Within functional limits for tasks assessed    SENSATION:  WFL  EDEMA:  Figure 8: R 53 cm/ L 60 cm  MUSCLE LENGTH:  NT  POSTURE: rounded shoulders  PALPATION:  Residual edema present in L lateral ankle.  Discussed scar massage/ incision healing.    LOWER EXTREMITY ROM:  Active ROM Right eval Left eval  Hip flexion    Hip extension    Hip abduction    Hip adduction    Hip internal rotation    Hip external rotation    Knee flexion    Knee extension    Ankle dorsiflexion 20 deg. 3 deg.  Ankle plantarflexion 50 deg. 40 deg.  Ankle inversion 30 deg. 18 deg.   Ankle eversion 20 deg. 8  deg.   (Blank rows = not tested)  LOWER EXTREMITY MMT:  MMT Right eval Left eval  Hip flexion 3 3  Hip extension 4 4  Hip abduction 3 3  Hip adduction    Hip internal rotation 4 4  Hip external rotation 4 4  Knee flexion 4 4  Knee extension 5 5  Ankle dorsiflexion 4 3  Ankle plantarflexion 5 3  Ankle inversion 5 3  Ankle eversion 5 3   (Blank rows = not tested)  LOWER EXTREMITY SPECIAL TESTS:   NT  FUNCTIONAL TESTS:   AROM: Ankle Dorsiflexion  GAIT: Distance walked: 50 ft. In clinic Assistive device utilized: None Level of assistance: Complete Independence Comments: Slight antalgic gait favoring weightbearing through LLE.  LEFS: 48  - L ankle AROM Plantarflexion 35 degrees - L ankle AROM Dorsiflexion 13 degrees                03/17/23:  L ankle AROM: DF 12 deg., PF 54 deg.  TREATMENT DATE: 04/30/2023  Subjective:  Pt. Reports 3/10 L ankle/foot pain prior to tx. Session.  Pt. Was sore for several days since last PT tx. Session.  Pt. Continues to have occasional L medial/ lateral "sharp" pain.    Therapeutic Exercise:  Nustep L6 at seat 5 for 10 min. B LE only.    TG squats/ unilateral 15x2.  Heel raises 20x.  L ankle "burning on inside" and "sharp on outside".    There.act.:  Blaze Pods at star (SLS touches)- 1 min. X 2.   Resisted gait:  2BTB with Blaze pod taps (forward/ lateral)- random set up.    Walking in gym with consistent heel strike/ toe off (several laps in gym/ hallway).  More normalized gait pattern.    Discussed gym based ex.   Manual tx.:   Supine L ankle stretches (all planes), focus on gastroc/ DF stretches.  Increase L lateral ankle pain with IV/ DF stretches.    Grade II L ankle joint mobs. (AP/PA).  Calcaneous distraction/ stretches.   PATIENT EDUCATION:  Education details: Pain management, scar management, activity limitations Person educated: Patient Education method:  Explanation Education comprehension: verbalized understanding  HOME EXERCISE PROGRAM:  Access Code: [email or web address] URL: [email or web address] Date: 02/02/2023 Prepared by: Hazeline Lister  Exercises - Long Sitting Ankle Dorsiflexion with Anchored Resistance  - 1 x daily - 7 x weekly - 3 sets - 10 reps - Seated Ankle Eversion with Resistance  - 1 x daily - 7 x weekly - 3 sets - 10 reps - Seated Ankle Plantar Flexion with Resistance Loop  - 1 x daily - 7 x weekly - 3 sets - 10 reps - Seated Ankle Inversion with Anchored Resistance  - 1 x daily - 7 x weekly - 3 sets - 10 reps - Sidelying Hip Abduction with Resistance at Ankle  - 1 x daily - 7 x weekly - 3 sets - 10 reps - Side Stepping with Resistance at Thighs and Ankles  - 1 x daily - 7 x weekly - 3 sets - 10 reps  ASSESSMENT:  CLINICAL IMPRESSION:  Today's treatment session focused on L ankle ROM/ stability and pain mgmt.  Pts. L ankle pain increased with progressive stability ex. During TG squats. Moderate L ankle swelling noted as compared to R ankle.  Pt. Worked hard throughout treatment session with a couple episodes of ankle pain with end-range EV/IV stretches and higher level stability ex.  PT recommends icing ankle at home this evening.  Pt. Will benefit from skilled PT services to increase L ankle ROM/ strength to improve pain-free mobility/ return to work with no limitations.   OBJECTIVE IMPAIRMENTS: decreased mobility.   ACTIVITY LIMITATIONS: squatting  PARTICIPATION LIMITATIONS: occupation  PERSONAL FACTORS: Past/current experiences are also affecting patient's functional outcome.   REHAB POTENTIAL: Good  CLINICAL DECISION MAKING: Stable/uncomplicated  EVALUATION COMPLEXITY: Low   GOALS: Goals reviewed with patient? Yes   LONG TERM GOALS: Target date: 06/02/2023  Patient will be able to perform 10 bilateral forward lunges with NPS 0/10 to return to confidently performing functional ADLs/IADLs related to  lifting/carrying objects. Baseline: NT Goal status: Partially met  2.  Patient will demonstrate 15 degrees of L ankle dorsiflexion to increase ground clearance during gait and promote normalized gait pattern.  Baseline: 3 degrees.  3/11: 12 deg. Goal status: Partially met  3.  Patient will increase proximal hip strength (ABD, ADD, IR/ER, EXT.) to 4/5 to increase global LE strength/stability. Baseline: 3/5 Goal status: Partially  met  4.  Patient will be able to jog in place for 2 minutes with no left ankle pain 0/10 NPS to promote return to gym based ex. Baseline:  Goal status: Ongoing  PLAN:  PT FREQUENCY: 2x/week  PT DURATION: 4 weeks  PLANNED INTERVENTIONS: 97110-Therapeutic exercises, 97530- Therapeutic activity, [id number]- Neuromuscular re-education, 97535- Self Care, 29562- Manual therapy, and 97116- Gait training  PLAN FOR NEXT SESSION:  Stability ex.  Lendell Quarry, PT, DPT # [phone] Physical Therapist - [facility]  04/30/2023, 8:25 PM

## 2023-05-06 ENCOUNTER — Ambulatory Visit: Admitting: Physical Therapy

## 2023-05-18 ENCOUNTER — Encounter: Payer: Self-pay | Admitting: Physical Therapy

## 2023-05-18 ENCOUNTER — Ambulatory Visit: Attending: Student | Admitting: Physical Therapy

## 2023-05-18 DIAGNOSIS — R262 Difficulty in walking, not elsewhere classified: Secondary | ICD-10-CM | POA: Insufficient documentation

## 2023-05-18 DIAGNOSIS — M25572 Pain in left ankle and joints of left foot: Secondary | ICD-10-CM | POA: Diagnosis present

## 2023-05-18 DIAGNOSIS — R600 Localized edema: Secondary | ICD-10-CM | POA: Diagnosis present

## 2023-05-18 DIAGNOSIS — M25672 Stiffness of left ankle, not elsewhere classified: Secondary | ICD-10-CM | POA: Diagnosis present

## 2023-05-18 DIAGNOSIS — M6281 Muscle weakness (generalized): Secondary | ICD-10-CM | POA: Insufficient documentation

## 2023-05-18 NOTE — Therapy (Signed)
 OUTPATIENT PHYSICAL THERAPY LOWER EXTREMITY TREATMENT  Patient Name: Kimberly [name] MRN: [id number] DOB:September 09, 1970, 53 y.o., female  Today's Date: 05/18/2023  END OF SESSION:  PT End of Session - 05/18/23 1439     Visit Number 23    Number of Visits 26    Date for PT Re-Evaluation 06/02/23    PT Start Time 1430    PT Stop Time 1523    PT Time Calculation (min) 53 min            Past Medical History:  Diagnosis Date   Anorectal fistula    Anxiety    Arthritis    neck, hips   Chronic headaches    migraines   History of hypertension    since wt loss after gastric sleeve 2014--  no longer issue   Hx of adenomatous colonic polyps 03/28/2022   Hypertension    Irritable bowel syndrome    PONV (postoperative nausea and vomiting)    slow to wake up   Wears glasses    Past Surgical History:  Procedure Laterality Date   BREAST REDUCTION SURGERY  1996   CESAREAN SECTION  2001   COLONOSCOPY  10-18-2010   w/ bx's   ESOPHAGOGASTRODUODENOSCOPY N/A 06/29/2012   Procedure: ESOPHAGOGASTRODUODENOSCOPY (EGD);  Surgeon: Evander Hills, DO;  Location: [facility];  Service: General;  Laterality: N/A;   EVALUATION UNDER ANESTHESIA WITH ANAL FISTULECTOMY N/A 04/14/2014   Procedure: ANAL EXAM UNDER ANESTHESIA WITH  FISTULOTOMY;  Surgeon: Joyce Nixon, MD;  Location: [facility];  Service: General;  Laterality: N/A;   LAPAROSCOPIC BILATERAL SALPINGECTOMY N/A 06/29/2012   Procedure: LAPAROSCOPIC BILATERAL SALPINGECTOMY;  Surgeon: Granville Layer, MD;  Location: [facility];  Service: Gynecology;  Laterality: N/A;   LAPAROSCOPIC GASTRIC SLEEVE RESECTION N/A 06/29/2012   Procedure: LAPAROSCOPIC GASTRIC SLEEVE RESECTION;  Surgeon: Evander Hills, DO;  Location: [facility];  Service: General;  Laterality: N/A;  Laparoscopic Sleeve Gastrectomy with EGD   REDUCTION MAMMAPLASTY Bilateral    VAGINAL HYSTERECTOMY N/A 01/13/2017   Procedure: HYSTERECTOMY VAGINAL;  Surgeon: Granville Layer, MD;  Location: [facility];   Service: Gynecology;  Laterality: N/A;   Patient Active Problem List   Diagnosis Date Noted   Attention or concentration deficit 03/04/2023   Hx of adenomatous colonic polyps 03/28/2022   Muscle spasm 11/13/2017   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   Migraine without aura 09/13/2010   ANA positive 09/13/2010   Polyarthralgia 09/13/2010   Essential hypertension 01/11/2009   DE QUERVAIN'S TENOSYNOVITIS 01/26/2008   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   REFERRING PROVIDER: Fawn Hooks, MD  REFERRING DIAG:   Posterior tibial tendinitis of left lower extremity  Tear of peroneal tendon, left, initial encounter  Impingement syndrome of left ankle  THERAPY DIAG:   Pain in left ankle and joints of left foot  Muscle weakness (generalized)  Ankle joint stiffness, left  Difficulty in walking, not elsewhere classified  Localized edema  Rationale for Evaluation and Treatment: Rehabilitation  ONSET DATE: Surgery Date 12/10/2022  SUBJECTIVE:   SUBJECTIVE STATEMENT:   Pt. Arrives to PT clinic FWB and prepared for evaluation session. Patient reports they are 7 weeks s/p L lateral ankle surgery following an injury during a Zumba class. Pt. Expressed having a history of bilateral ankle sprains with cheerleading and gymnastics participation in their past. Pt. Notes having PT in past with benefit but not liking dry-needling after past PT treatments. Pt. 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 Pt. Highlights that they would like to be able to get back to their normal routines and be able to trust their ankle to support them. Pt. Describes that they are excited and ready to begin physical therapy.  PERTINENT HISTORY:  Signs and Symptoms concordant with EDS. PAIN:   Are you having pain? Yes: NPRS scale: 3/10 Pain location: Left Ankle Pain description:  Dull/achy Aggravating factors: Long durations of weightbearing, AROM dorsiflexion/inversion Relieving factors: Ice and rest  PRECAUTIONS: None  RED FLAGS:  Signs and Sx's concordant with EDS variant   WEIGHT BEARING RESTRICTIONS:  No  FALLS:   Has patient fallen in last 6 months? 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  Patient demonstrates rounded shoulders and forward head posture.  Patient demonstrates anterior pelvic tilt/ MCI of pelvic stabilization musculature.  Pt. Surgical sites are clean, sterile, and healing well.  Pt. Demonstrates decreased strength in proximal hip musculature  Pt. Demonstrates P with all AROM for L ankle. NPS 4/10  Note: Objective measures were completed at Evaluation unless otherwise noted.  DIAGNOSTIC FINDINGS:   L Lateral ankle ROM/Strength Deficit  PATIENT SURVEYS:    LEFS: 27/80  COGNITION:  Overall cognitive status: Within functional limits for tasks assessed    SENSATION:  WFL  EDEMA:  Figure 8: R 53 cm/ L 60 cm  MUSCLE LENGTH:  NT  POSTURE: rounded shoulders  PALPATION:  Residual edema present in L lateral ankle.  Discussed scar massage/ incision healing.    LOWER EXTREMITY ROM:  Active ROM Right eval Left eval  Hip flexion    Hip extension    Hip abduction    Hip adduction    Hip internal rotation    Hip external rotation    Knee flexion    Knee extension    Ankle dorsiflexion 20 deg. 3 deg.  Ankle plantarflexion 50 deg. 40 deg.  Ankle inversion 30 deg. 18 deg.   Ankle eversion 20 deg. 8  deg.   (Blank rows = not tested)  LOWER EXTREMITY MMT:  MMT Right eval Left eval  Hip flexion 3 3  Hip extension 4 4  Hip abduction 3 3  Hip adduction    Hip internal rotation 4 4  Hip external rotation 4 4  Knee flexion 4 4  Knee extension 5 5  Ankle dorsiflexion 4 3  Ankle plantarflexion 5 3  Ankle inversion 5 3  Ankle eversion 5 3   (Blank rows = not tested)  LOWER EXTREMITY SPECIAL TESTS:   NT  FUNCTIONAL TESTS:   AROM: Ankle Dorsiflexion  GAIT: Distance walked: 50 ft. In clinic Assistive device utilized: None Level of assistance: Complete Independence Comments: Slight antalgic gait favoring weightbearing through LLE.  LEFS: 48  - L ankle AROM Plantarflexion 35 degrees - L ankle AROM Dorsiflexion 13 degrees                03/17/23:  L ankle AROM: DF 12 deg., PF 54 deg.  TREATMENT DATE: 05/18/2023  Subjective:  Discussed MD f/u.  Pt. Scheduled for Muculoskeletal Ultrasound on 5/20 and may receive a Toradol  injection.  Pt. Reports marked increase in L ankle swelling after returning home from trip to [place].  Pt. Reports 3/10 L ankle/foot pain prior to tx. Session.  Pt. Continues to have occasional L medial/ lateral "sharp" pain.    Therapeutic Exercise:  Nustep L6 at seat 5 for 10 min. B LE only.    TG squats/ unilateral 15x2.  Heel raises 20x.  L ankle "burning on inside" and "sharp on outside".    Supine L ankle resisted ex. (GTB)- 10x2 all 4-planes.    There.act.:  Agility ladder.  Lateral/ forward/backwards 2 laps each.  Walking lunges (2 episodes of L medial ankle/foot pain with DF).    Walking in gym with consistent heel strike/ toe off (several laps in gym/ hallway).  More normalized gait pattern.    Discussed gym based ex.   Manual tx.:   Supine L ankle stretches (all planes), focus on gastroc/ DF stretches.  Increase L lateral ankle pain with IV/ DF stretches.    Grade II L ankle joint mobs. (AP/PA).   Calcaneous distraction/ stretches.   PATIENT EDUCATION:  Education details: Pain management, scar management, activity limitations Person educated: Patient Education method: Explanation Education comprehension: verbalized understanding  HOME EXERCISE PROGRAM:  Access Code: [email or web address] URL: [email or web address] Date: 02/02/2023 Prepared by: Hazeline Lister  Exercises - Long Sitting Ankle Dorsiflexion with Anchored Resistance  - 1 x daily - 7 x weekly - 3 sets - 10 reps - Seated Ankle Eversion with Resistance  - 1 x daily - 7 x weekly - 3 sets - 10 reps - Seated Ankle Plantar Flexion with Resistance Loop  - 1 x daily - 7 x weekly - 3 sets - 10 reps - Seated Ankle Inversion with Anchored Resistance  - 1 x daily - 7 x weekly - 3 sets - 10 reps - Sidelying Hip Abduction with Resistance at Ankle  - 1 x daily - 7 x weekly - 3 sets - 10 reps - Side Stepping with Resistance at Thighs and Ankles  - 1 x daily - 7 x weekly - 3 sets - 10 reps  ASSESSMENT:  CLINICAL IMPRESSION:  Today's treatment session focused on L ankle ROM/ stability and pain mgmt.  Pts. L ankle pain increased with progressive stability ex. During TG squats. Moderate L ankle swelling noted as compared to R ankle.  Pt. Worked hard throughout treatment session with a couple episodes of ankle pain with end-range EV/IV stretches and higher level stability ex.  PT recommends icing ankle at home this evening.  Pt. Will benefit from skilled PT services to increase L ankle ROM/ strength to improve pain-free mobility/ return to work with no limitations.   OBJECTIVE IMPAIRMENTS: decreased mobility.   ACTIVITY LIMITATIONS: squatting  PARTICIPATION LIMITATIONS: occupation  PERSONAL FACTORS: Past/current experiences are also affecting patient's functional outcome.   REHAB POTENTIAL: Good  CLINICAL DECISION MAKING: Stable/uncomplicated  EVALUATION COMPLEXITY: Low   GOALS: Goals reviewed with patient? Yes   LONG  TERM GOALS: Target date: 06/02/2023  Patient will be able to perform 10 bilateral forward lunges with NPS 0/10 to return to confidently performing functional ADLs/IADLs related to lifting/carrying objects. Baseline: NT Goal status: Partially met  2.  Patient will demonstrate 15 degrees of L ankle dorsiflexion to increase ground clearance during gait and promote normalized gait pattern.  Baseline: 3 degrees.  3/11: 12 deg.  Goal status: Partially met  3.  Patient will increase proximal hip strength (ABD, ADD, IR/ER, EXT.) to 4/5 to increase global LE strength/stability. Baseline: 3/5 Goal status: Partially met  4.  Patient will be able to jog in place for 2 minutes with no left ankle pain 0/10 NPS to promote return to gym based ex. Baseline:  Goal status: Ongoing  PLAN:  PT FREQUENCY: 2x/week  PT DURATION: 4 weeks  PLANNED INTERVENTIONS: 97110-Therapeutic exercises, 97530- Therapeutic activity, [id number]- Neuromuscular re-education, 97535- Self Care, 16109- Manual therapy, and 97116- Gait training  PLAN FOR NEXT SESSION:  Stability ex./ Discuss MD f/u  Lendell Quarry, PT, DPT # [phone] Physical Therapist - [facility]  05/18/2023, 6:37 PM

## 2023-05-28 ENCOUNTER — Encounter: Payer: Self-pay | Admitting: Physical Therapy

## 2023-05-28 ENCOUNTER — Ambulatory Visit: Admitting: Physical Therapy

## 2023-05-28 DIAGNOSIS — R600 Localized edema: Secondary | ICD-10-CM

## 2023-05-28 DIAGNOSIS — M25672 Stiffness of left ankle, not elsewhere classified: Secondary | ICD-10-CM

## 2023-05-28 DIAGNOSIS — R262 Difficulty in walking, not elsewhere classified: Secondary | ICD-10-CM

## 2023-05-28 DIAGNOSIS — M25572 Pain in left ankle and joints of left foot: Secondary | ICD-10-CM | POA: Diagnosis not present

## 2023-05-28 DIAGNOSIS — M6281 Muscle weakness (generalized): Secondary | ICD-10-CM

## 2023-05-28 NOTE — Therapy (Signed)
 OUTPATIENT PHYSICAL THERAPY LOWER EXTREMITY TREATMENT  Patient Name: Kimberly [name] MRN: [id number] DOB:Jan 18, 1970, 53 y.o., female  Today's Date: 05/28/2023  END OF SESSION:  PT End of Session - 05/28/23 1653     Visit Number 24    Number of Visits 26    Date for PT Re-Evaluation 06/02/23    PT Start Time 1638    PT Stop Time 1730    PT Time Calculation (min) 52 min            Past Medical History:  Diagnosis Date   Anorectal fistula    Anxiety    Arthritis    neck, hips   Chronic headaches    migraines   History of hypertension    since wt loss after gastric sleeve 2014--  no longer issue   Hx of adenomatous colonic polyps 03/28/2022   Hypertension    Irritable bowel syndrome    PONV (postoperative nausea and vomiting)    slow to wake up   Wears glasses    Past Surgical History:  Procedure Laterality Date   BREAST REDUCTION SURGERY  1996   CESAREAN SECTION  2001   COLONOSCOPY  10-18-2010   w/ bx's   ESOPHAGOGASTRODUODENOSCOPY N/A 06/29/2012   Procedure: ESOPHAGOGASTRODUODENOSCOPY (EGD);  Surgeon: Evander Hills, DO;  Location: [facility];  Service: General;  Laterality: N/A;   EVALUATION UNDER ANESTHESIA WITH ANAL FISTULECTOMY N/A 04/14/2014   Procedure: ANAL EXAM UNDER ANESTHESIA WITH  FISTULOTOMY;  Surgeon: Joyce Nixon, MD;  Location: [facility] [place];  Service: General;  Laterality: N/A;   LAPAROSCOPIC BILATERAL SALPINGECTOMY N/A 06/29/2012   Procedure: LAPAROSCOPIC BILATERAL SALPINGECTOMY;  Surgeon: Granville Layer, MD;  Location: [facility];  Service: Gynecology;  Laterality: N/A;   LAPAROSCOPIC GASTRIC SLEEVE RESECTION N/A 06/29/2012   Procedure: LAPAROSCOPIC GASTRIC SLEEVE RESECTION;  Surgeon: Evander Hills, DO;  Location: [facility];  Service: General;  Laterality: N/A;  Laparoscopic Sleeve Gastrectomy with EGD   REDUCTION MAMMAPLASTY Bilateral    VAGINAL HYSTERECTOMY N/A 01/13/2017   Procedure: HYSTERECTOMY VAGINAL;  Surgeon: Granville Layer, MD;  Location: [facility];   Service: Gynecology;  Laterality: N/A;   Patient Active Problem List   Diagnosis Date Noted   Attention or concentration deficit 03/04/2023   Hx of adenomatous colonic polyps 03/28/2022   Muscle spasm 11/13/2017   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   Migraine without aura 09/13/2010   ANA positive 09/13/2010   Polyarthralgia 09/13/2010   Essential hypertension 01/11/2009   DE QUERVAIN'S TENOSYNOVITIS 01/26/2008   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   REFERRING PROVIDER: Fawn Hooks, MD  REFERRING DIAG:   Posterior tibial tendinitis of left lower extremity  Tear of peroneal tendon, left, initial encounter  Impingement syndrome of left ankle  THERAPY DIAG:   Pain in left ankle and joints of left foot  Muscle weakness (generalized)  Ankle joint stiffness, left  Difficulty in walking, not elsewhere classified  Localized edema  Rationale for Evaluation and Treatment: Rehabilitation  ONSET DATE: Surgery Date 12/10/2022  SUBJECTIVE:   SUBJECTIVE STATEMENT:   Pt. Arrives to PT clinic FWB and prepared for evaluation session. Patient reports they are 7 weeks s/p L lateral ankle surgery following an injury during a Zumba class. Pt. Expressed having a history of bilateral ankle sprains with cheerleading and gymnastics participation in their past. Pt. Notes having PT in past with benefit but not liking dry-needling after past PT treatments. Pt. 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 Pt. Highlights that they would like to be able to get back to their normal routines and be able to trust their ankle to support them. Pt. Describes that they are excited and ready to begin physical therapy.  PERTINENT HISTORY:  Signs and Symptoms concordant with EDS. PAIN:   Are you having pain? Yes: NPRS scale: 3/10 Pain location: Left Ankle Pain description:  Dull/achy Aggravating factors: Long durations of weightbearing, AROM dorsiflexion/inversion Relieving factors: Ice and rest  PRECAUTIONS: None  RED FLAGS:  Signs and Sx's concordant with EDS variant   WEIGHT BEARING RESTRICTIONS:  No  FALLS:   Has patient fallen in last 6 months? 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  Patient demonstrates rounded shoulders and forward head posture.  Patient demonstrates anterior pelvic tilt/ MCI of pelvic stabilization musculature.  Pt. Surgical sites are clean, sterile, and healing well.  Pt. Demonstrates decreased strength in proximal hip musculature  Pt. Demonstrates P with all AROM for L ankle. NPS 4/10  Note: Objective measures were completed at Evaluation unless otherwise noted.  DIAGNOSTIC FINDINGS:   L Lateral ankle ROM/Strength Deficit  PATIENT SURVEYS:    LEFS: 27/80  COGNITION:  Overall cognitive status: Within functional limits for tasks assessed    SENSATION:  WFL  EDEMA:  Figure 8: R 53 cm/ L 60 cm  MUSCLE LENGTH:  NT  POSTURE: rounded shoulders  PALPATION:  Residual edema present in L lateral ankle.  Discussed scar massage/ incision healing.    LOWER EXTREMITY ROM:  Active ROM Right eval Left eval  Hip flexion    Hip extension    Hip abduction    Hip adduction    Hip internal rotation    Hip external rotation    Knee flexion    Knee extension    Ankle dorsiflexion 20 deg. 3 deg.  Ankle plantarflexion 50 deg. 40 deg.  Ankle inversion 30 deg. 18 deg.   Ankle eversion 20 deg. 8  deg.   (Blank rows = not tested)  LOWER EXTREMITY MMT:  MMT Right eval Left eval  Hip flexion 3 3  Hip extension 4 4  Hip abduction 3 3  Hip adduction    Hip internal rotation 4 4  Hip external rotation 4 4  Knee flexion 4 4  Knee extension 5 5  Ankle dorsiflexion 4 3  Ankle plantarflexion 5 3  Ankle inversion 5 3  Ankle eversion 5 3   (Blank rows = not tested)  LOWER EXTREMITY SPECIAL TESTS:   NT  FUNCTIONAL TESTS:   AROM: Ankle Dorsiflexion  GAIT: Distance walked: 50 ft. In clinic Assistive device utilized: None Level of assistance: Complete Independence Comments: Slight antalgic gait favoring weightbearing through LLE.  LEFS: 48  - L ankle AROM Plantarflexion 35 degrees - L ankle AROM Dorsiflexion 13 degrees                03/17/23:  L ankle AROM: DF 12 deg., PF 54 deg.  TREATMENT DATE: 05/28/2023  Subjective:  Pt. received a Toradol  injection to L medial foot on Tuesday and c/o significant increase in pain initially and currently 4-5/10 (L medial foot).  Pt. Has limited LE ex. This week since injection.  Pt. Wearing B compression stockings.    Therapeutic Exercise:  Nustep L5 at seat 5 for 10 min. B LE only.    Walking in hallway with gait reassessment since injection.  Slight L antalgic gait with L LE wt. Bearing.  No increase c/o pain but pain remained around 5/10  Prostretch 5x with static holds in //-bars.    Walking lunges in //-bars (3 laps)- slight increase in symptoms  6" step ups on L (forward/ lateral/ backwards)  Supine L ankle resisted ex. (GTB)- 10x2 all 4-planes.    Manual tx.:   Supine L ankle stretches (all planes), focus on gastroc/ DF stretches.  Increase L lateral ankle pain with IV/ DF stretches.    Grade II L ankle joint mobs. (AP/PA).  STM to L ankle (as tolerated).  Ice to L ankle after tx. Session.   NOT TODAY:  PATIENT EDUCATION:  Education details: Pain management, scar  management, activity limitations Person educated: Patient Education method: Explanation Education comprehension: verbalized understanding  HOME EXERCISE PROGRAM:  Access Code: [email or web address] URL: [email or web address] Date: 02/02/2023 Prepared by: Hazeline Lister  Exercises - Long Sitting Ankle Dorsiflexion with Anchored Resistance  - 1 x daily - 7 x weekly - 3 sets - 10 reps - Seated Ankle Eversion with Resistance  - 1 x daily - 7 x weekly - 3 sets - 10 reps - Seated Ankle Plantar Flexion with Resistance Loop  - 1 x daily - 7 x weekly - 3 sets - 10 reps - Seated Ankle Inversion with Anchored Resistance  - 1 x daily - 7 x weekly - 3 sets - 10 reps - Sidelying Hip Abduction with Resistance at Ankle  - 1 x daily - 7 x weekly - 3 sets - 10 reps - Side Stepping with Resistance at Thighs and Ankles  - 1 x daily - 7 x weekly - 3 sets - 10 reps  ASSESSMENT:  CLINICAL IMPRESSION:  Today's treatment session focused on L ankle ROM/ stability and pain mgmt.  Pt. Instructed to slowly ease back into ex. Program at home/ gym.    Pt. Worked hard throughout treatment session with a couple episodes of ankle pain with end-range EV/IV stretches/ resisted ex. With GTB.   PT recommends icing ankle at home this evening.  Pt. Will benefit from skilled PT services to increase L ankle ROM/ strength to improve pain-free mobility/ return to work with no limitations.   OBJECTIVE IMPAIRMENTS: decreased mobility.   ACTIVITY LIMITATIONS: squatting  PARTICIPATION LIMITATIONS: occupation  PERSONAL FACTORS: Past/current experiences are also affecting patient's functional outcome.   REHAB POTENTIAL: Good  CLINICAL DECISION MAKING: Stable/uncomplicated  EVALUATION COMPLEXITY: Low   GOALS: Goals reviewed with patient? Yes   LONG TERM GOALS: Target date: 06/02/2023  Patient will be able to perform 10 bilateral forward lunges with NPS 0/10 to return to confidently performing functional ADLs/IADLs  related to lifting/carrying objects. Baseline: NT Goal status: Partially met  2.  Patient will demonstrate 15 degrees of L ankle dorsiflexion to increase ground clearance during gait and promote normalized gait pattern.  Baseline: 3 degrees.  3/11: 12 deg. Goal status: Partially met  3.  Patient will increase proximal hip strength (ABD, ADD, IR/ER, EXT.) to 4/5 to increase global LE strength/stability.  Baseline: 3/5 Goal status: Partially met  4.  Patient will be able to jog in place for 2 minutes with no left ankle pain 0/10 NPS to promote return to gym based ex. Baseline:  Goal status: Ongoing  PLAN:  PT FREQUENCY: 2x/week  PT DURATION: 4 weeks  PLANNED INTERVENTIONS: 97110-Therapeutic exercises, 97530- Therapeutic activity, [id number]- Neuromuscular re-education, 97535- Self Care, 65784- Manual therapy, and 97116- Gait training  PLAN FOR NEXT SESSION:  CHECK GOALS/ discuss POC next tx.    Lendell Quarry, PT, DPT # [phone] Physical Therapist - [facility]  05/28/2023, 6:28 PM

## 2023-06-02 ENCOUNTER — Ambulatory Visit: Admitting: Physical Therapy

## 2023-06-02 ENCOUNTER — Encounter: Payer: Self-pay | Admitting: Physical Therapy

## 2023-06-02 DIAGNOSIS — M25572 Pain in left ankle and joints of left foot: Secondary | ICD-10-CM | POA: Diagnosis not present

## 2023-06-02 DIAGNOSIS — R262 Difficulty in walking, not elsewhere classified: Secondary | ICD-10-CM

## 2023-06-02 DIAGNOSIS — R600 Localized edema: Secondary | ICD-10-CM

## 2023-06-02 DIAGNOSIS — M25672 Stiffness of left ankle, not elsewhere classified: Secondary | ICD-10-CM

## 2023-06-02 DIAGNOSIS — M6281 Muscle weakness (generalized): Secondary | ICD-10-CM

## 2023-06-02 NOTE — Therapy (Signed)
 OUTPATIENT PHYSICAL THERAPY LOWER EXTREMITY TREATMENT  Patient Name: Kimberly [name] MRN: [id number] DOB:[birth year], 53 y.o., female  Today's Date: 06/03/2023  END OF SESSION:  PT End of Session - 06/02/23 1631     Visit Number 25    Number of Visits 26    Date for PT Re-Evaluation 06/02/23    PT Start Time 1631    PT Stop Time 1740    PT Time Calculation (min) 69 min            Past Medical History:  Diagnosis Date   Anorectal fistula    Anxiety    Arthritis    neck, hips   Chronic headaches    migraines   History of hypertension    since wt loss after gastric sleeve 2014--  no longer issue   Hx of adenomatous colonic polyps 03/28/2022   Hypertension    Irritable bowel syndrome    PONV (postoperative nausea and vomiting)    slow to wake up   Wears glasses    Past Surgical History:  Procedure Laterality Date   BREAST REDUCTION SURGERY  1996   CESAREAN SECTION  2001   COLONOSCOPY  10-18-2010   w/ bx's   ESOPHAGOGASTRODUODENOSCOPY N/A 06/29/2012   Procedure: ESOPHAGOGASTRODUODENOSCOPY (EGD);  Surgeon: Evander Hills, DO;  Location: [facility];  Service: General;  Laterality: N/A;   EVALUATION UNDER ANESTHESIA WITH ANAL FISTULECTOMY N/A 04/14/2014   Procedure: ANAL EXAM UNDER ANESTHESIA WITH  FISTULOTOMY;  Surgeon: Joyce Nixon, MD;  Location: [facility];  Service: General;  Laterality: N/A;   LAPAROSCOPIC BILATERAL SALPINGECTOMY N/A 06/29/2012   Procedure: LAPAROSCOPIC BILATERAL SALPINGECTOMY;  Surgeon: Granville Layer, MD;  Location: [facility];  Service: Gynecology;  Laterality: N/A;   LAPAROSCOPIC GASTRIC SLEEVE RESECTION N/A 06/29/2012   Procedure: LAPAROSCOPIC GASTRIC SLEEVE RESECTION;  Surgeon: Evander Hills, DO;  Location: [facility];  Service: General;  Laterality: N/A;  Laparoscopic Sleeve Gastrectomy with EGD   REDUCTION MAMMAPLASTY Bilateral    VAGINAL HYSTERECTOMY N/A 01/13/2017   Procedure: HYSTERECTOMY VAGINAL;  Surgeon: Granville Layer, MD;  Location: [facility];   Service: Gynecology;  Laterality: N/A;   Patient Active Problem List   Diagnosis Date Noted   Attention or concentration deficit 03/04/2023   Hx of adenomatous colonic polyps 03/28/2022   Muscle spasm 11/13/2017   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   Migraine without aura 09/13/2010   ANA positive 09/13/2010   Polyarthralgia 09/13/2010   Essential hypertension 01/11/2009   DE QUERVAIN'S TENOSYNOVITIS 01/26/2008   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   REFERRING PROVIDER: Fawn Hooks, MD  REFERRING DIAG:   Posterior tibial tendinitis of left lower extremity  Tear of peroneal tendon, left, initial encounter  Impingement syndrome of left ankle  THERAPY DIAG:   Pain in left ankle and joints of left foot  Muscle weakness (generalized)  Ankle joint stiffness, left  Difficulty in walking, not elsewhere classified  Localized edema  Rationale for Evaluation and Treatment: Rehabilitation  ONSET DATE: Surgery Date 12/10/2022  SUBJECTIVE:   SUBJECTIVE STATEMENT:   Pt. Arrives to PT clinic [facility] and prepared for evaluation session. Patient reports they are 7 weeks s/p L lateral ankle surgery following an injury during a Zumba class. Pt. Expressed having a history of bilateral ankle sprains with cheerleading and gymnastics participation in their past. Pt. Notes having PT in past with benefit but not liking dry-needling after past PT treatments. Pt. 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 Pt. Highlights that they would like to be able to get back to their normal routines and be able to trust their ankle to support them. Pt. Describes that they are excited and ready to begin physical therapy.  PERTINENT HISTORY:  Signs and Symptoms concordant with EDS. PAIN:   Are you having pain? Yes: NPRS scale: 3/10 Pain location: Left Ankle Pain description:  Dull/achy Aggravating factors: Long durations of weightbearing, AROM dorsiflexion/inversion Relieving factors: Ice and rest  PRECAUTIONS: None  RED FLAGS:  Signs and Sx's concordant with EDS variant   WEIGHT BEARING RESTRICTIONS:  No  FALLS:   Has patient fallen in last 6 months? 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  Patient demonstrates rounded shoulders and forward head posture.  Patient demonstrates anterior pelvic tilt/ MCI of pelvic stabilization musculature.  Pt. Surgical sites are clean, sterile, and healing well.  Pt. Demonstrates decreased strength in proximal hip musculature  Pt. Demonstrates P with all AROM for L ankle. NPS 4/10  Note: Objective measures were completed at Evaluation unless otherwise noted.  DIAGNOSTIC FINDINGS:   L Lateral ankle ROM/Strength Deficit  PATIENT SURVEYS:    LEFS: 27/80  COGNITION:  Overall cognitive status: Within functional limits for tasks assessed    SENSATION:  WFL  EDEMA:  Figure 8: R 53 cm/ L 60 cm  MUSCLE LENGTH:  NT  POSTURE: rounded shoulders  PALPATION:  Residual edema present in L lateral ankle.  Discussed scar massage/ incision healing.    LOWER EXTREMITY ROM:  Active ROM Right eval Left eval  Hip flexion    Hip extension    Hip abduction    Hip adduction    Hip internal rotation    Hip external rotation    Knee flexion    Knee extension    Ankle dorsiflexion 20 deg. 3 deg.  Ankle plantarflexion 50 deg. 40 deg.  Ankle inversion 30 deg. 18 deg.   Ankle eversion 20 deg. 8  deg.   (Blank rows = not tested)  LOWER EXTREMITY MMT:  MMT Right eval Left eval  Hip flexion 3 3  Hip extension 4 4  Hip abduction 3 3  Hip adduction    Hip internal rotation 4 4  Hip external rotation 4 4  Knee flexion 4 4  Knee extension 5 5  Ankle dorsiflexion 4 3  Ankle plantarflexion 5 3  Ankle inversion 5 3  Ankle eversion 5 3   (Blank rows = not tested)  LOWER EXTREMITY SPECIAL TESTS:   NT  FUNCTIONAL TESTS:   AROM: Ankle Dorsiflexion  GAIT: Distance walked: 50 ft. In clinic Assistive device utilized: None Level of assistance: Complete Independence Comments: Slight antalgic gait favoring weightbearing through LLE.  LEFS: 48  - L ankle AROM Plantarflexion 35 degrees - L ankle AROM Dorsiflexion 13 degrees                03/17/23:  L ankle AROM: DF 12 deg., PF 54 deg.  TREATMENT DATE: 06/03/2023  Subjective:  Pt. Reports 3-4/10 (L medial foot). Pt. Active over the weekend and had a trip/fall in the garden while stepping over a border.  Pt. Doing better this week as compared to last week with ankle pain/swelling.     Therapeutic Act.:  Nustep L5 at seat 5 for 10 min. B LE only.  Discussed weekend activities/ gym based ex.  Pt. Will put a pause of Zumba at this time and focus on strengthening class.    6"/12" step touches with Blaze pods 1 min. X 2 on L/R with Airex.    6" step ups on L/R with added step downs in //-bars (10x2).  Good L ankle control/DF with no UE assist.    Walking in hallway with gait reassessment.  Slight L antalgic gait with L LE wt. Bearing.  No increase c/o pain.  LEFS: 57 out of 80 (marked improvement)  Supine L ankle manual isometrics (all planes)- 5x each with moderate resistance.  Less resistance with IV/EV.    Manual tx.:   Supine L ankle stretches (all planes), focus on gastroc/ DF stretches.  Increase L lateral ankle pain with IV/ DF stretches.  L ankle DF: 15 deg./ PF: 58 deg.  L  ankle fig. 8: 53 cm (marked improvement).    Grade II L ankle joint mobs. (AP/PA).  STM to L ankle (as tolerated).  (+) tenderness remain over medial aspect.    Pt. Will ice at home PRN   NOT TODAY:  PATIENT EDUCATION:  Education details: Pain management, scar management, activity limitations Person educated: Patient Education method: Explanation Education comprehension: verbalized understanding  HOME EXERCISE PROGRAM:  Access Code: [email or web address] URL: [email or web address] Date: 02/02/2023 Prepared by: Hazeline Lister  Exercises - Long Sitting Ankle Dorsiflexion with Anchored Resistance  - 1 x daily - 7 x weekly - 3 sets - 10 reps - Seated Ankle Eversion with Resistance  - 1 x daily - 7 x weekly - 3 sets - 10 reps - Seated Ankle Plantar Flexion with Resistance Loop  - 1 x daily - 7 x weekly - 3 sets - 10 reps - Seated Ankle Inversion with Anchored Resistance  - 1 x daily - 7 x weekly - 3 sets - 10 reps - Sidelying Hip Abduction with Resistance at Ankle  - 1 x daily - 7 x weekly - 3 sets - 10 reps - Side Stepping with Resistance at Thighs and Ankles  - 1 x daily - 7 x weekly - 3 sets - 10 reps  ASSESSMENT:  CLINICAL IMPRESSION:  Today's treatment session focused on L ankle ROM/ stability and pain mgmt.  Marked increase in L ankle AROM: DF 15 deg., PF 58 deg.  Decrease in L ankle swelling as compared to several weeks ago.  Pt. Remains active with daily tasks/ exercise program and instructed to put Zumba on hold at this time.  Pt. Will continue with strength training classes and cardio.  PT recommends icing ankle at home this evening to manage swelling.  Pt. Will continue with independent ex. Program over next 2 weeks and then PT will f/u with pt. To check status.  Pt. Instructed to contact PT if any regression in symptoms or questions.    OBJECTIVE IMPAIRMENTS: decreased mobility.   ACTIVITY LIMITATIONS: squatting  PARTICIPATION LIMITATIONS: occupation  PERSONAL  FACTORS: Past/current experiences are also affecting patient's functional outcome.   REHAB POTENTIAL: Good  CLINICAL DECISION MAKING: Stable/uncomplicated  EVALUATION COMPLEXITY: Low   GOALS: Goals  reviewed with patient? Yes   LONG TERM GOALS: Target date: 06/02/2023  Patient will be able to perform 10 bilateral forward lunges with NPS 0/10 to return to confidently performing functional ADLs/IADLs related to lifting/carrying objects. Baseline: NT Goal status: Partially met  2.  Patient will demonstrate 15 degrees of L ankle dorsiflexion to increase ground clearance during gait and promote normalized gait pattern.  Baseline: 3 degrees.  3/11: 12 deg.  5/27:  15 deg. Goal status: Goal met  3.  Patient will increase proximal hip strength (ABD, ADD, IR/ER, EXT.) to 4/5 to increase global LE strength/stability. Baseline: 3/5.  5/28: 4+/5 MMT Goal status: Goal met  4.  Patient will be able to jog in place for 2 minutes with no left ankle pain 0/10 NPS to promote return to gym based ex. Baseline:  PT not recommending jogging or jumping at this time Goal status: Not met  PLAN:  PT FREQUENCY: 2x/week  PT DURATION: 4 weeks  PLANNED INTERVENTIONS: 97110-Therapeutic exercises, 97530- Therapeutic activity, [id number]- Neuromuscular re-education, 97535- Self Care, 47425- Manual therapy, and 97116- Gait training  PLAN FOR NEXT SESSION:  Contact pt in 2 weeks.    Lendell Quarry, PT, DPT # [phone] Physical Therapist - [facility]  06/03/2023, 7:27 AM

## 2023-06-03 ENCOUNTER — Encounter: Payer: Self-pay | Admitting: Family Medicine

## 2023-06-03 DIAGNOSIS — L247 Irritant contact dermatitis due to plants, except food: Secondary | ICD-10-CM

## 2023-06-03 DIAGNOSIS — R41840 Attention and concentration deficit: Secondary | ICD-10-CM

## 2023-06-04 MED ORDER — PREDNISONE 20 MG PO TABS: 40.0000 mg | ORAL_TABLET | Freq: Every day | ORAL | 0 refills | Status: DC

## 2023-06-04 MED ORDER — ATOMOXETINE HCL 18 MG PO CAPS: 18.0000 mg | ORAL_CAPSULE | Freq: Every day | ORAL | 3 refills | Status: DC

## 2023-06-09 MED ORDER — PREDNISONE 20 MG PO TABS
40.0000 mg | ORAL_TABLET | Freq: Every day | ORAL | 0 refills | Status: AC
Start: 2023-06-09 — End: 2023-06-14

## 2023-06-09 NOTE — Addendum Note (Signed)
 Addended by: Granville Layer on: 06/09/2023 11:21 AM   Modules accepted: Orders

## 2023-06-26 ENCOUNTER — Other Ambulatory Visit: Payer: Self-pay | Admitting: *Deleted

## 2023-06-26 MED ORDER — BUSPIRONE HCL 10 MG PO TABS: 10.0000 mg | ORAL_TABLET | Freq: Three times a day (TID) | ORAL | 3 refills | Status: AC

## 2023-07-12 ENCOUNTER — Encounter: Payer: Self-pay | Admitting: Physical Therapy

## 2023-07-12 ENCOUNTER — Encounter: Payer: Self-pay | Admitting: Family Medicine

## 2023-07-12 DIAGNOSIS — R41840 Attention and concentration deficit: Secondary | ICD-10-CM

## 2023-07-13 MED ORDER — ATOMOXETINE HCL 18 MG PO CAPS: 18.0000 mg | ORAL_CAPSULE | Freq: Every day | ORAL | 3 refills | Status: AC

## 2023-09-23 ENCOUNTER — Encounter: Payer: Self-pay | Admitting: Family Medicine

## 2023-09-23 DIAGNOSIS — K640 First degree hemorrhoids: Secondary | ICD-10-CM

## 2023-09-23 MED ORDER — NAPROXEN 500 MG PO TABS: 500.0000 mg | ORAL_TABLET | Freq: Two times a day (BID) | ORAL | 3 refills | Status: AC

## 2023-09-23 MED ORDER — HYDROCORTISONE ACE-PRAMOXINE 1-1 % EX CREA: 1.0000 | TOPICAL_CREAM | Freq: Two times a day (BID) | CUTANEOUS | 0 refills | Status: AC

## 2023-10-13 ENCOUNTER — Encounter: Payer: Self-pay | Admitting: Family Medicine

## 2023-10-13 DIAGNOSIS — N3000 Acute cystitis without hematuria: Secondary | ICD-10-CM

## 2023-10-13 MED ORDER — CEFADROXIL 500 MG PO CAPS: 500.0000 mg | ORAL_CAPSULE | Freq: Two times a day (BID) | ORAL | 0 refills | Status: AC

## 2023-11-09 ENCOUNTER — Encounter: Payer: Self-pay | Admitting: Radiology

## 2024-01-06 ENCOUNTER — Encounter: Payer: Self-pay | Admitting: Family Medicine

## 2024-01-08 MED ORDER — ALPRAZOLAM 0.5 MG PO TABS: 0.5000 mg | ORAL_TABLET | Freq: Every day | ORAL | 0 refills | Status: AC | PRN
